# Patient Record
Sex: Female | Born: 1940
Health system: Southern US, Community
[De-identification: ages and names within clinical notes are randomized; demographics above are authoritative.]

## PROBLEM LIST (undated history)

## (undated) DIAGNOSIS — K219 Gastro-esophageal reflux disease without esophagitis: Secondary | ICD-10-CM

## (undated) DIAGNOSIS — C801 Malignant (primary) neoplasm, unspecified: Secondary | ICD-10-CM

## (undated) DIAGNOSIS — D649 Anemia, unspecified: Secondary | ICD-10-CM

## (undated) DIAGNOSIS — H9190 Unspecified hearing loss, unspecified ear: Secondary | ICD-10-CM

## (undated) DIAGNOSIS — F419 Anxiety disorder, unspecified: Secondary | ICD-10-CM

## (undated) DIAGNOSIS — Z9889 Other specified postprocedural states: Secondary | ICD-10-CM

## (undated) DIAGNOSIS — E039 Hypothyroidism, unspecified: Secondary | ICD-10-CM

## (undated) DIAGNOSIS — Z87442 Personal history of urinary calculi: Secondary | ICD-10-CM

## (undated) DIAGNOSIS — H9319 Tinnitus, unspecified ear: Secondary | ICD-10-CM

## (undated) DIAGNOSIS — M1990 Unspecified osteoarthritis, unspecified site: Secondary | ICD-10-CM

## (undated) DIAGNOSIS — R112 Nausea with vomiting, unspecified: Secondary | ICD-10-CM

## (undated) DIAGNOSIS — I10 Essential (primary) hypertension: Secondary | ICD-10-CM

## (undated) HISTORY — PX: OTHER SURGICAL HISTORY: SHX169

## (undated) HISTORY — PX: TOE SURGERY: SHX1073

## (undated) HISTORY — PX: BLEPHAROPLASTY: SUR158

## (undated) HISTORY — PX: HEMORROIDECTOMY: SUR656

## (undated) HISTORY — PX: LIPOSUCTION: SHX10

## (undated) HISTORY — PX: TONSILLECTOMY: SUR1361

## (undated) HISTORY — PX: PELVIC FLOOR REPAIR: SHX2192

## (undated) HISTORY — PX: VARICOSE VEIN SURGERY: SHX832

## (undated) HISTORY — PX: KNEE ARTHROSCOPY: SUR90

## (undated) HISTORY — PX: ABDOMINAL HYSTERECTOMY: SHX81

---

## 1997-05-09 DIAGNOSIS — C801 Malignant (primary) neoplasm, unspecified: Secondary | ICD-10-CM

## 1997-05-09 HISTORY — DX: Malignant (primary) neoplasm, unspecified: C80.1

## 2003-04-15 ENCOUNTER — Encounter: Admission: RE | Admit: 2003-04-15 | Discharge: 2003-04-15 | Payer: Self-pay | Admitting: Unknown Physician Specialty

## 2003-05-10 HISTORY — PX: CYSTOCELE REPAIR: SHX163

## 2004-02-07 ENCOUNTER — Encounter: Payer: Self-pay | Admitting: Pain Medicine

## 2004-10-07 ENCOUNTER — Ambulatory Visit: Payer: Self-pay

## 2004-11-26 ENCOUNTER — Other Ambulatory Visit: Payer: Self-pay

## 2004-12-01 ENCOUNTER — Ambulatory Visit: Payer: Self-pay | Admitting: General Practice

## 2004-12-21 ENCOUNTER — Ambulatory Visit: Payer: Self-pay | Admitting: Unknown Physician Specialty

## 2005-08-22 ENCOUNTER — Ambulatory Visit: Payer: Self-pay | Admitting: General Practice

## 2005-09-06 ENCOUNTER — Ambulatory Visit: Payer: Self-pay | Admitting: General Practice

## 2005-10-07 ENCOUNTER — Ambulatory Visit: Payer: Self-pay | Admitting: General Practice

## 2006-01-11 ENCOUNTER — Ambulatory Visit: Payer: Self-pay | Admitting: Unknown Physician Specialty

## 2006-01-30 ENCOUNTER — Encounter: Payer: Self-pay | Admitting: General Practice

## 2006-02-06 ENCOUNTER — Encounter: Payer: Self-pay | Admitting: General Practice

## 2006-02-09 ENCOUNTER — Ambulatory Visit: Payer: Self-pay | Admitting: General Practice

## 2006-03-09 ENCOUNTER — Encounter: Payer: Self-pay | Admitting: General Practice

## 2006-04-08 ENCOUNTER — Encounter: Payer: Self-pay | Admitting: General Practice

## 2006-05-09 ENCOUNTER — Encounter: Payer: Self-pay | Admitting: General Practice

## 2007-01-23 ENCOUNTER — Ambulatory Visit: Payer: Self-pay | Admitting: Unknown Physician Specialty

## 2008-01-29 ENCOUNTER — Ambulatory Visit: Payer: Self-pay | Admitting: Unknown Physician Specialty

## 2009-01-30 ENCOUNTER — Ambulatory Visit: Payer: Self-pay | Admitting: Unknown Physician Specialty

## 2010-01-29 ENCOUNTER — Encounter: Payer: Self-pay | Admitting: Unknown Physician Specialty

## 2010-02-06 ENCOUNTER — Encounter: Payer: Self-pay | Admitting: Unknown Physician Specialty

## 2010-02-10 ENCOUNTER — Ambulatory Visit: Payer: Self-pay | Admitting: Unknown Physician Specialty

## 2010-03-09 ENCOUNTER — Encounter: Payer: Self-pay | Admitting: Unknown Physician Specialty

## 2010-04-08 ENCOUNTER — Encounter: Payer: Self-pay | Admitting: Unknown Physician Specialty

## 2010-05-09 ENCOUNTER — Encounter: Payer: Self-pay | Admitting: Unknown Physician Specialty

## 2010-06-09 ENCOUNTER — Encounter: Payer: Self-pay | Admitting: Unknown Physician Specialty

## 2010-06-25 ENCOUNTER — Ambulatory Visit: Payer: Self-pay | Admitting: Podiatry

## 2010-11-03 ENCOUNTER — Ambulatory Visit: Payer: Self-pay

## 2011-03-17 ENCOUNTER — Ambulatory Visit: Payer: Self-pay | Admitting: Internal Medicine

## 2012-03-20 ENCOUNTER — Ambulatory Visit: Payer: Self-pay | Admitting: Obstetrics and Gynecology

## 2012-04-23 ENCOUNTER — Ambulatory Visit: Payer: Self-pay | Admitting: Unknown Physician Specialty

## 2012-04-24 ENCOUNTER — Ambulatory Visit: Payer: Self-pay | Admitting: Internal Medicine

## 2013-03-12 DIAGNOSIS — E785 Hyperlipidemia, unspecified: Secondary | ICD-10-CM | POA: Insufficient documentation

## 2013-04-02 ENCOUNTER — Ambulatory Visit: Payer: Self-pay | Admitting: Internal Medicine

## 2013-12-16 DIAGNOSIS — N3941 Urge incontinence: Secondary | ICD-10-CM | POA: Insufficient documentation

## 2014-05-13 ENCOUNTER — Ambulatory Visit: Payer: Self-pay | Admitting: General Practice

## 2015-05-14 DIAGNOSIS — M170 Bilateral primary osteoarthritis of knee: Secondary | ICD-10-CM | POA: Diagnosis not present

## 2015-05-19 DIAGNOSIS — I10 Essential (primary) hypertension: Secondary | ICD-10-CM | POA: Diagnosis not present

## 2015-05-19 DIAGNOSIS — E034 Atrophy of thyroid (acquired): Secondary | ICD-10-CM | POA: Diagnosis not present

## 2015-05-19 DIAGNOSIS — M170 Bilateral primary osteoarthritis of knee: Secondary | ICD-10-CM | POA: Diagnosis not present

## 2015-05-19 DIAGNOSIS — R002 Palpitations: Secondary | ICD-10-CM | POA: Diagnosis not present

## 2015-05-19 DIAGNOSIS — K219 Gastro-esophageal reflux disease without esophagitis: Secondary | ICD-10-CM | POA: Diagnosis not present

## 2015-05-19 DIAGNOSIS — E7800 Pure hypercholesterolemia, unspecified: Secondary | ICD-10-CM | POA: Diagnosis not present

## 2015-05-21 DIAGNOSIS — M170 Bilateral primary osteoarthritis of knee: Secondary | ICD-10-CM | POA: Diagnosis not present

## 2015-05-25 DIAGNOSIS — E034 Atrophy of thyroid (acquired): Secondary | ICD-10-CM | POA: Diagnosis not present

## 2015-05-25 DIAGNOSIS — R002 Palpitations: Secondary | ICD-10-CM | POA: Diagnosis not present

## 2015-05-25 DIAGNOSIS — F419 Anxiety disorder, unspecified: Secondary | ICD-10-CM | POA: Diagnosis not present

## 2015-05-25 DIAGNOSIS — E7800 Pure hypercholesterolemia, unspecified: Secondary | ICD-10-CM | POA: Diagnosis not present

## 2015-05-25 DIAGNOSIS — I10 Essential (primary) hypertension: Secondary | ICD-10-CM | POA: Diagnosis not present

## 2015-05-25 DIAGNOSIS — K219 Gastro-esophageal reflux disease without esophagitis: Secondary | ICD-10-CM | POA: Diagnosis not present

## 2015-05-25 DIAGNOSIS — M170 Bilateral primary osteoarthritis of knee: Secondary | ICD-10-CM | POA: Diagnosis not present

## 2015-06-09 DIAGNOSIS — H2513 Age-related nuclear cataract, bilateral: Secondary | ICD-10-CM | POA: Diagnosis not present

## 2015-06-16 DIAGNOSIS — F3342 Major depressive disorder, recurrent, in full remission: Secondary | ICD-10-CM | POA: Diagnosis not present

## 2015-06-16 DIAGNOSIS — F410 Panic disorder [episodic paroxysmal anxiety] without agoraphobia: Secondary | ICD-10-CM | POA: Diagnosis not present

## 2015-06-16 DIAGNOSIS — F411 Generalized anxiety disorder: Secondary | ICD-10-CM | POA: Diagnosis not present

## 2015-06-25 DIAGNOSIS — E034 Atrophy of thyroid (acquired): Secondary | ICD-10-CM | POA: Diagnosis not present

## 2015-06-25 DIAGNOSIS — I10 Essential (primary) hypertension: Secondary | ICD-10-CM | POA: Diagnosis not present

## 2015-06-26 DIAGNOSIS — N3941 Urge incontinence: Secondary | ICD-10-CM | POA: Diagnosis not present

## 2015-07-24 DIAGNOSIS — N3941 Urge incontinence: Secondary | ICD-10-CM | POA: Diagnosis not present

## 2015-07-31 DIAGNOSIS — N3281 Overactive bladder: Secondary | ICD-10-CM | POA: Diagnosis not present

## 2015-07-31 DIAGNOSIS — N8111 Cystocele, midline: Secondary | ICD-10-CM | POA: Diagnosis not present

## 2015-08-07 DIAGNOSIS — R5383 Other fatigue: Secondary | ICD-10-CM | POA: Diagnosis not present

## 2015-09-09 DIAGNOSIS — N3281 Overactive bladder: Secondary | ICD-10-CM | POA: Diagnosis not present

## 2015-09-14 DIAGNOSIS — R399 Unspecified symptoms and signs involving the genitourinary system: Secondary | ICD-10-CM | POA: Diagnosis not present

## 2015-09-23 DIAGNOSIS — R339 Retention of urine, unspecified: Secondary | ICD-10-CM | POA: Diagnosis not present

## 2015-09-23 DIAGNOSIS — N3281 Overactive bladder: Secondary | ICD-10-CM | POA: Diagnosis not present

## 2015-10-09 DIAGNOSIS — R339 Retention of urine, unspecified: Secondary | ICD-10-CM | POA: Diagnosis not present

## 2015-10-13 DIAGNOSIS — F3342 Major depressive disorder, recurrent, in full remission: Secondary | ICD-10-CM | POA: Diagnosis not present

## 2015-10-13 DIAGNOSIS — F410 Panic disorder [episodic paroxysmal anxiety] without agoraphobia: Secondary | ICD-10-CM | POA: Diagnosis not present

## 2015-10-13 DIAGNOSIS — F411 Generalized anxiety disorder: Secondary | ICD-10-CM | POA: Diagnosis not present

## 2015-10-14 DIAGNOSIS — R1032 Left lower quadrant pain: Secondary | ICD-10-CM | POA: Diagnosis not present

## 2015-10-17 DIAGNOSIS — R8290 Unspecified abnormal findings in urine: Secondary | ICD-10-CM | POA: Diagnosis not present

## 2015-10-17 DIAGNOSIS — N390 Urinary tract infection, site not specified: Secondary | ICD-10-CM | POA: Diagnosis not present

## 2015-10-20 DIAGNOSIS — N3281 Overactive bladder: Secondary | ICD-10-CM | POA: Diagnosis not present

## 2015-10-20 DIAGNOSIS — R339 Retention of urine, unspecified: Secondary | ICD-10-CM | POA: Diagnosis not present

## 2015-12-02 DIAGNOSIS — R8290 Unspecified abnormal findings in urine: Secondary | ICD-10-CM | POA: Diagnosis not present

## 2015-12-02 DIAGNOSIS — R339 Retention of urine, unspecified: Secondary | ICD-10-CM | POA: Diagnosis not present

## 2015-12-02 DIAGNOSIS — K582 Mixed irritable bowel syndrome: Secondary | ICD-10-CM | POA: Diagnosis not present

## 2015-12-02 DIAGNOSIS — N3281 Overactive bladder: Secondary | ICD-10-CM | POA: Diagnosis not present

## 2015-12-03 DIAGNOSIS — M170 Bilateral primary osteoarthritis of knee: Secondary | ICD-10-CM | POA: Diagnosis not present

## 2015-12-07 DIAGNOSIS — R350 Frequency of micturition: Secondary | ICD-10-CM | POA: Diagnosis not present

## 2015-12-10 DIAGNOSIS — M170 Bilateral primary osteoarthritis of knee: Secondary | ICD-10-CM | POA: Diagnosis not present

## 2015-12-15 DIAGNOSIS — I10 Essential (primary) hypertension: Secondary | ICD-10-CM | POA: Diagnosis not present

## 2015-12-15 DIAGNOSIS — E034 Atrophy of thyroid (acquired): Secondary | ICD-10-CM | POA: Diagnosis not present

## 2015-12-15 DIAGNOSIS — M170 Bilateral primary osteoarthritis of knee: Secondary | ICD-10-CM | POA: Diagnosis not present

## 2015-12-17 DIAGNOSIS — M170 Bilateral primary osteoarthritis of knee: Secondary | ICD-10-CM | POA: Diagnosis not present

## 2015-12-29 DIAGNOSIS — N3946 Mixed incontinence: Secondary | ICD-10-CM | POA: Diagnosis not present

## 2015-12-29 DIAGNOSIS — M6281 Muscle weakness (generalized): Secondary | ICD-10-CM | POA: Diagnosis not present

## 2015-12-30 ENCOUNTER — Other Ambulatory Visit: Payer: Self-pay | Admitting: Internal Medicine

## 2015-12-30 DIAGNOSIS — E7800 Pure hypercholesterolemia, unspecified: Secondary | ICD-10-CM | POA: Diagnosis not present

## 2015-12-30 DIAGNOSIS — F3341 Major depressive disorder, recurrent, in partial remission: Secondary | ICD-10-CM | POA: Diagnosis not present

## 2015-12-30 DIAGNOSIS — F419 Anxiety disorder, unspecified: Secondary | ICD-10-CM | POA: Diagnosis not present

## 2015-12-30 DIAGNOSIS — Z1231 Encounter for screening mammogram for malignant neoplasm of breast: Secondary | ICD-10-CM

## 2015-12-30 DIAGNOSIS — M170 Bilateral primary osteoarthritis of knee: Secondary | ICD-10-CM | POA: Diagnosis not present

## 2015-12-30 DIAGNOSIS — Z1239 Encounter for other screening for malignant neoplasm of breast: Secondary | ICD-10-CM | POA: Diagnosis not present

## 2015-12-30 DIAGNOSIS — R002 Palpitations: Secondary | ICD-10-CM | POA: Diagnosis not present

## 2015-12-30 DIAGNOSIS — I10 Essential (primary) hypertension: Secondary | ICD-10-CM | POA: Diagnosis not present

## 2015-12-30 DIAGNOSIS — E034 Atrophy of thyroid (acquired): Secondary | ICD-10-CM | POA: Diagnosis not present

## 2015-12-30 DIAGNOSIS — K219 Gastro-esophageal reflux disease without esophagitis: Secondary | ICD-10-CM | POA: Diagnosis not present

## 2015-12-30 DIAGNOSIS — N3941 Urge incontinence: Secondary | ICD-10-CM | POA: Diagnosis not present

## 2016-01-04 DIAGNOSIS — R350 Frequency of micturition: Secondary | ICD-10-CM | POA: Diagnosis not present

## 2016-01-08 DIAGNOSIS — R339 Retention of urine, unspecified: Secondary | ICD-10-CM | POA: Diagnosis not present

## 2016-01-12 ENCOUNTER — Ambulatory Visit: Payer: Self-pay

## 2016-01-14 DIAGNOSIS — M6281 Muscle weakness (generalized): Secondary | ICD-10-CM | POA: Diagnosis not present

## 2016-01-26 DIAGNOSIS — F3342 Major depressive disorder, recurrent, in full remission: Secondary | ICD-10-CM | POA: Diagnosis not present

## 2016-01-26 DIAGNOSIS — F411 Generalized anxiety disorder: Secondary | ICD-10-CM | POA: Diagnosis not present

## 2016-01-26 DIAGNOSIS — F410 Panic disorder [episodic paroxysmal anxiety] without agoraphobia: Secondary | ICD-10-CM | POA: Diagnosis not present

## 2016-01-29 DIAGNOSIS — Z85828 Personal history of other malignant neoplasm of skin: Secondary | ICD-10-CM | POA: Diagnosis not present

## 2016-01-29 DIAGNOSIS — D2272 Melanocytic nevi of left lower limb, including hip: Secondary | ICD-10-CM | POA: Diagnosis not present

## 2016-01-29 DIAGNOSIS — D225 Melanocytic nevi of trunk: Secondary | ICD-10-CM | POA: Diagnosis not present

## 2016-01-29 DIAGNOSIS — I781 Nevus, non-neoplastic: Secondary | ICD-10-CM | POA: Diagnosis not present

## 2016-01-29 DIAGNOSIS — M6281 Muscle weakness (generalized): Secondary | ICD-10-CM | POA: Diagnosis not present

## 2016-02-08 ENCOUNTER — Other Ambulatory Visit: Payer: Self-pay | Admitting: Internal Medicine

## 2016-02-08 ENCOUNTER — Ambulatory Visit
Admission: RE | Admit: 2016-02-08 | Discharge: 2016-02-08 | Disposition: A | Discharge status: Home / Self Care | Payer: PPO | Source: Ambulatory Visit | Attending: Internal Medicine | Admitting: Internal Medicine

## 2016-02-08 DIAGNOSIS — Z1231 Encounter for screening mammogram for malignant neoplasm of breast: Secondary | ICD-10-CM | POA: Diagnosis not present

## 2016-02-12 DIAGNOSIS — M6281 Muscle weakness (generalized): Secondary | ICD-10-CM | POA: Diagnosis not present

## 2016-02-15 DIAGNOSIS — R339 Retention of urine, unspecified: Secondary | ICD-10-CM | POA: Diagnosis not present

## 2016-02-18 DIAGNOSIS — M6281 Muscle weakness (generalized): Secondary | ICD-10-CM | POA: Diagnosis not present

## 2016-03-01 DIAGNOSIS — M6281 Muscle weakness (generalized): Secondary | ICD-10-CM | POA: Diagnosis not present

## 2016-03-15 DIAGNOSIS — M6281 Muscle weakness (generalized): Secondary | ICD-10-CM | POA: Diagnosis not present

## 2016-03-21 DIAGNOSIS — M6281 Muscle weakness (generalized): Secondary | ICD-10-CM | POA: Diagnosis not present

## 2016-03-22 DIAGNOSIS — R339 Retention of urine, unspecified: Secondary | ICD-10-CM | POA: Diagnosis not present

## 2016-03-29 DIAGNOSIS — M6281 Muscle weakness (generalized): Secondary | ICD-10-CM | POA: Diagnosis not present

## 2016-04-05 DIAGNOSIS — M6281 Muscle weakness (generalized): Secondary | ICD-10-CM | POA: Diagnosis not present

## 2016-04-19 DIAGNOSIS — M6281 Muscle weakness (generalized): Secondary | ICD-10-CM | POA: Diagnosis not present

## 2016-05-24 DIAGNOSIS — F3342 Major depressive disorder, recurrent, in full remission: Secondary | ICD-10-CM | POA: Diagnosis not present

## 2016-05-24 DIAGNOSIS — M6281 Muscle weakness (generalized): Secondary | ICD-10-CM | POA: Diagnosis not present

## 2016-05-24 DIAGNOSIS — F411 Generalized anxiety disorder: Secondary | ICD-10-CM | POA: Diagnosis not present

## 2016-05-24 DIAGNOSIS — F410 Panic disorder [episodic paroxysmal anxiety] without agoraphobia: Secondary | ICD-10-CM | POA: Diagnosis not present

## 2016-06-14 DIAGNOSIS — H2513 Age-related nuclear cataract, bilateral: Secondary | ICD-10-CM | POA: Diagnosis not present

## 2016-06-27 DIAGNOSIS — I10 Essential (primary) hypertension: Secondary | ICD-10-CM | POA: Diagnosis not present

## 2016-06-27 DIAGNOSIS — K219 Gastro-esophageal reflux disease without esophagitis: Secondary | ICD-10-CM | POA: Diagnosis not present

## 2016-06-27 DIAGNOSIS — E034 Atrophy of thyroid (acquired): Secondary | ICD-10-CM | POA: Diagnosis not present

## 2016-06-27 DIAGNOSIS — M170 Bilateral primary osteoarthritis of knee: Secondary | ICD-10-CM | POA: Diagnosis not present

## 2016-06-30 DIAGNOSIS — M1711 Unilateral primary osteoarthritis, right knee: Secondary | ICD-10-CM | POA: Diagnosis not present

## 2016-06-30 DIAGNOSIS — M1712 Unilateral primary osteoarthritis, left knee: Secondary | ICD-10-CM | POA: Diagnosis not present

## 2016-07-01 DIAGNOSIS — I10 Essential (primary) hypertension: Secondary | ICD-10-CM | POA: Diagnosis not present

## 2016-07-01 DIAGNOSIS — E034 Atrophy of thyroid (acquired): Secondary | ICD-10-CM | POA: Diagnosis not present

## 2016-07-01 DIAGNOSIS — R002 Palpitations: Secondary | ICD-10-CM | POA: Diagnosis not present

## 2016-07-01 DIAGNOSIS — M170 Bilateral primary osteoarthritis of knee: Secondary | ICD-10-CM | POA: Diagnosis not present

## 2016-07-01 DIAGNOSIS — K219 Gastro-esophageal reflux disease without esophagitis: Secondary | ICD-10-CM | POA: Diagnosis not present

## 2016-07-01 DIAGNOSIS — E7800 Pure hypercholesterolemia, unspecified: Secondary | ICD-10-CM | POA: Diagnosis not present

## 2016-07-01 DIAGNOSIS — F3341 Major depressive disorder, recurrent, in partial remission: Secondary | ICD-10-CM | POA: Diagnosis not present

## 2016-07-01 DIAGNOSIS — F419 Anxiety disorder, unspecified: Secondary | ICD-10-CM | POA: Diagnosis not present

## 2016-07-13 DIAGNOSIS — J069 Acute upper respiratory infection, unspecified: Secondary | ICD-10-CM | POA: Diagnosis not present

## 2016-07-13 DIAGNOSIS — F3341 Major depressive disorder, recurrent, in partial remission: Secondary | ICD-10-CM | POA: Diagnosis not present

## 2016-07-28 DIAGNOSIS — M6281 Muscle weakness (generalized): Secondary | ICD-10-CM | POA: Diagnosis not present

## 2016-07-28 DIAGNOSIS — N3946 Mixed incontinence: Secondary | ICD-10-CM | POA: Diagnosis not present

## 2016-08-16 DIAGNOSIS — M170 Bilateral primary osteoarthritis of knee: Secondary | ICD-10-CM | POA: Diagnosis not present

## 2016-08-17 DIAGNOSIS — L728 Other follicular cysts of the skin and subcutaneous tissue: Secondary | ICD-10-CM | POA: Diagnosis not present

## 2016-08-23 DIAGNOSIS — M170 Bilateral primary osteoarthritis of knee: Secondary | ICD-10-CM | POA: Diagnosis not present

## 2016-08-30 DIAGNOSIS — M170 Bilateral primary osteoarthritis of knee: Secondary | ICD-10-CM | POA: Diagnosis not present

## 2016-09-20 DIAGNOSIS — F410 Panic disorder [episodic paroxysmal anxiety] without agoraphobia: Secondary | ICD-10-CM | POA: Diagnosis not present

## 2016-09-20 DIAGNOSIS — F411 Generalized anxiety disorder: Secondary | ICD-10-CM | POA: Diagnosis not present

## 2016-09-20 DIAGNOSIS — F3342 Major depressive disorder, recurrent, in full remission: Secondary | ICD-10-CM | POA: Diagnosis not present

## 2016-10-07 DIAGNOSIS — Z23 Encounter for immunization: Secondary | ICD-10-CM | POA: Diagnosis not present

## 2016-11-01 DIAGNOSIS — M25562 Pain in left knee: Secondary | ICD-10-CM | POA: Diagnosis not present

## 2016-11-01 DIAGNOSIS — M1712 Unilateral primary osteoarthritis, left knee: Secondary | ICD-10-CM | POA: Diagnosis not present

## 2016-11-02 ENCOUNTER — Encounter
Admission: RE | Admit: 2016-11-02 | Discharge: 2016-11-02 | Disposition: A | Discharge status: Home / Self Care | Payer: PPO | Source: Ambulatory Visit | Attending: Orthopedic Surgery | Admitting: Orthopedic Surgery

## 2016-11-02 DIAGNOSIS — F3342 Major depressive disorder, recurrent, in full remission: Secondary | ICD-10-CM | POA: Diagnosis not present

## 2016-11-02 DIAGNOSIS — Z01818 Encounter for other preprocedural examination: Secondary | ICD-10-CM | POA: Diagnosis not present

## 2016-11-02 DIAGNOSIS — F411 Generalized anxiety disorder: Secondary | ICD-10-CM | POA: Diagnosis not present

## 2016-11-02 DIAGNOSIS — M1712 Unilateral primary osteoarthritis, left knee: Secondary | ICD-10-CM | POA: Diagnosis not present

## 2016-11-02 DIAGNOSIS — F410 Panic disorder [episodic paroxysmal anxiety] without agoraphobia: Secondary | ICD-10-CM | POA: Diagnosis not present

## 2016-11-02 DIAGNOSIS — Z01812 Encounter for preprocedural laboratory examination: Secondary | ICD-10-CM | POA: Diagnosis not present

## 2016-11-02 DIAGNOSIS — Z0183 Encounter for blood typing: Secondary | ICD-10-CM | POA: Insufficient documentation

## 2016-11-02 DIAGNOSIS — I10 Essential (primary) hypertension: Secondary | ICD-10-CM | POA: Diagnosis not present

## 2016-11-02 HISTORY — DX: Tinnitus, unspecified ear: H93.19

## 2016-11-02 HISTORY — DX: Anxiety disorder, unspecified: F41.9

## 2016-11-02 HISTORY — DX: Unspecified hearing loss, unspecified ear: H91.90

## 2016-11-02 HISTORY — DX: Essential (primary) hypertension: I10

## 2016-11-02 HISTORY — DX: Hypothyroidism, unspecified: E03.9

## 2016-11-02 LAB — COMPREHENSIVE METABOLIC PANEL
ALBUMIN: 4.4 g/dL (ref 3.5–5.0)
ALK PHOS: 58 U/L (ref 38–126)
ALT: 12 U/L — ABNORMAL LOW (ref 14–54)
ANION GAP: 5 (ref 5–15)
AST: 21 U/L (ref 15–41)
BUN: 19 mg/dL (ref 6–20)
CALCIUM: 9 mg/dL (ref 8.9–10.3)
CHLORIDE: 102 mmol/L (ref 101–111)
CO2: 28 mmol/L (ref 22–32)
Creatinine, Ser: 0.77 mg/dL (ref 0.44–1.00)
GFR calc Af Amer: >60 mL/min (ref >60)
GFR calc non Af Amer: >60 mL/min (ref >60)
GLUCOSE: 89 mg/dL (ref 65–99)
Potassium: 3.9 mmol/L (ref 3.5–5.1)
SODIUM: 135 mmol/L (ref 135–145)
Total Bilirubin: 0.8 mg/dL (ref 0.3–1.2)
Total Protein: 7.7 g/dL (ref 6.5–8.1)

## 2016-11-02 LAB — URINALYSIS, ROUTINE W REFLEX MICROSCOPIC
BILIRUBIN URINE: NEGATIVE
Glucose, UA: NEGATIVE mg/dL
HGB URINE DIPSTICK: NEGATIVE
KETONES UR: NEGATIVE mg/dL
Leukocytes, UA: NEGATIVE
Nitrite: NEGATIVE
PROTEIN: NEGATIVE mg/dL
Specific Gravity, Urine: 1.006 (ref 1.005–1.030)
pH: 6 (ref 5.0–8.0)

## 2016-11-02 LAB — SEDIMENTATION RATE: Sed Rate: 8 mm/h (ref 0–30)

## 2016-11-02 LAB — CBC
HCT: 39.4 % (ref 35.0–47.0)
Hemoglobin: 13.3 g/dL (ref 12.0–16.0)
MCH: 30.8 pg (ref 26.0–34.0)
MCHC: 33.8 g/dL (ref 32.0–36.0)
MCV: 91 fL (ref 80.0–100.0)
Platelets: 281 K/uL (ref 150–440)
RBC: 4.32 MIL/uL (ref 3.80–5.20)
RDW: 13.9 % (ref 11.5–14.5)
WBC: 5.3 K/uL (ref 3.6–11.0)

## 2016-11-02 LAB — TYPE AND SCREEN
ABO/RH(D): O POS
Antibody Screen: NEGATIVE

## 2016-11-02 LAB — SURGICAL PCR SCREEN
MRSA, PCR: NEGATIVE
Staphylococcus aureus: NEGATIVE

## 2016-11-02 LAB — C-REACTIVE PROTEIN

## 2016-11-02 LAB — PROTIME-INR
INR: 1.01
Prothrombin Time: 13.3 s (ref 11.4–15.2)

## 2016-11-02 LAB — APTT: aPTT: 30 s (ref 24–36)

## 2016-11-02 NOTE — Patient Instructions (Signed)
Your procedure is scheduled on: 11/14/16 Mon Report to Same Day Surgery 2nd floor medical mall [facility] Entrance-take elevator on left to 2nd floor.  Check in with surgery information desk.) To find out your arrival time please call [phone] between 1PM - 3PM on Fri 11/11/16  Remember: Instructions that are not followed completely may result in serious medical risk, up to and including death, or upon the discretion of your surgeon and anesthesiologist your surgery may need to be rescheduled.    _x___ 1. Do not eat food or drink liquids after midnight. No gum chewing or                              hard candies.     __x__ 2. No Alcohol for 24 hours before or after surgery.   __x__3. No Smoking for 24 prior to surgery.   ____  4. Bring all medications with you on the day of surgery if instructed.    __x__ 5. Notify your doctor if there is any change in your medical condition     (cold, fever, infections).     Do not wear jewelry, make-up, hairpins, clips or nail polish.  Do not wear lotions, powders, or perfumes. You may wear deodorant.  Do not shave 48 hours prior to surgery. Men may shave face and neck.  Do not bring valuables to the hospital.    [facility] is not responsible for any belongings or valuables.               Contacts, dentures or bridgework may not be worn into surgery.  Leave your suitcase in the car. After surgery it may be brought to your room.  For patients admitted to the hospital, discharge time is determined by your                       treatment team.   Patients discharged the day of surgery will not be allowed to drive home.  You will need someone to drive you home and stay with you the night of your procedure.    Please read over the following fact sheets that you were given:   [facility] Preparing for Surgery and or MRSA Information   _x___ Take anti-hypertensive (unless it includes a diuretic), cardiac, seizure, asthma,     anti-reflux and  psychiatric medicines. These include:  1. levothyroxine (SYNTHROID,   2.metoprolol succinate (TOPROL  3.sertraline (ZOLOFT  4.  5.  6.  ____Fleets enema or Magnesium Citrate as directed.   _x___ Use CHG Soap or sage wipes as directed on instruction sheet   ____ Use inhalers on the day of surgery and bring to hospital day of surgery  ____ Stop Metformin and Janumet 2 days prior to surgery.    ____ Take 1/2 of usual insulin dose the night before surgery and none on the morning     surgery.   _x___ Follow recommendations from Cardiologist, Pulmonologist or PCP regarding          stopping Aspirin, Coumadin, Pllavix ,Eliquis, Effient, or Pradaxa, and Pletal.  X____Stop Anti-inflammatories such as Advil, Aleve, Ibuprofen, Motrin, Naproxen, Naprosyn, Goodies powders or aspirin products. OK to take Tylenol and                          Celebrex.   _x___ Stop supplements until after surgery.  But may  continue Vitamin D, Vitamin B,       and multivitamin.   ____ Bring C-Pap to the hospital.

## 2016-11-06 HISTORY — PX: JOINT REPLACEMENT: SHX530

## 2016-11-07 NOTE — Pre-Procedure Instructions (Signed)
[facility] CALLED AND FAXED TO SURGEON OFFICE

## 2016-11-10 LAB — URINE CULTURE
Culture: 50000 — AB
Special Requests: NORMAL

## 2016-11-13 MED ORDER — CEFAZOLIN SODIUM-DEXTROSE 2-4 GM/100ML-% IV SOLN
2.0000 g | INTRAVENOUS | Status: AC
  Administered 2016-11-14: 2 g via INTRAVENOUS

## 2016-11-13 MED ORDER — TRANEXAMIC ACID 1000 MG/10ML IV SOLN
1000.0000 mg | INTRAVENOUS | Status: AC
  Administered 2016-11-14: 1000 mg via INTRAVENOUS
  Filled 2016-11-13: qty 10

## 2016-11-14 ENCOUNTER — Encounter
Admission: RE | Disposition: A | Discharge status: Home Health | Payer: Self-pay | Source: Ambulatory Visit | Attending: Orthopedic Surgery

## 2016-11-14 ENCOUNTER — Inpatient Hospital Stay: Payer: PPO

## 2016-11-14 ENCOUNTER — Inpatient Hospital Stay
Admission: RE | Admit: 2016-11-14 | Discharge: 2016-11-16 | DRG: 470 | Disposition: A | Discharge status: Home Health | Payer: PPO | Source: Ambulatory Visit | Attending: Orthopedic Surgery | Admitting: Orthopedic Surgery

## 2016-11-14 ENCOUNTER — Inpatient Hospital Stay: Payer: PPO | Admitting: Anesthesiology

## 2016-11-14 ENCOUNTER — Encounter: Payer: Self-pay | Admitting: Orthopedic Surgery

## 2016-11-14 DIAGNOSIS — M1712 Unilateral primary osteoarthritis, left knee: Principal | ICD-10-CM | POA: Diagnosis present

## 2016-11-14 DIAGNOSIS — Z8261 Family history of arthritis: Secondary | ICD-10-CM

## 2016-11-14 DIAGNOSIS — E785 Hyperlipidemia, unspecified: Secondary | ICD-10-CM | POA: Diagnosis present

## 2016-11-14 DIAGNOSIS — Z808 Family history of malignant neoplasm of other organs or systems: Secondary | ICD-10-CM

## 2016-11-14 DIAGNOSIS — I10 Essential (primary) hypertension: Secondary | ICD-10-CM | POA: Diagnosis present

## 2016-11-14 DIAGNOSIS — Z8249 Family history of ischemic heart disease and other diseases of the circulatory system: Secondary | ICD-10-CM

## 2016-11-14 DIAGNOSIS — Z8049 Family history of malignant neoplasm of other genital organs: Secondary | ICD-10-CM

## 2016-11-14 DIAGNOSIS — Z833 Family history of diabetes mellitus: Secondary | ICD-10-CM

## 2016-11-14 DIAGNOSIS — Z87891 Personal history of nicotine dependence: Secondary | ICD-10-CM | POA: Diagnosis not present

## 2016-11-14 DIAGNOSIS — Z96652 Presence of left artificial knee joint: Secondary | ICD-10-CM | POA: Diagnosis not present

## 2016-11-14 DIAGNOSIS — Z882 Allergy status to sulfonamides status: Secondary | ICD-10-CM

## 2016-11-14 DIAGNOSIS — F419 Anxiety disorder, unspecified: Secondary | ICD-10-CM | POA: Diagnosis present

## 2016-11-14 DIAGNOSIS — H9190 Unspecified hearing loss, unspecified ear: Secondary | ICD-10-CM | POA: Diagnosis present

## 2016-11-14 DIAGNOSIS — Z881 Allergy status to other antibiotic agents status: Secondary | ICD-10-CM

## 2016-11-14 DIAGNOSIS — Z87442 Personal history of urinary calculi: Secondary | ICD-10-CM

## 2016-11-14 DIAGNOSIS — Z90710 Acquired absence of both cervix and uterus: Secondary | ICD-10-CM | POA: Diagnosis not present

## 2016-11-14 DIAGNOSIS — M25562 Pain in left knee: Secondary | ICD-10-CM | POA: Diagnosis present

## 2016-11-14 DIAGNOSIS — Z471 Aftercare following joint replacement surgery: Secondary | ICD-10-CM | POA: Diagnosis not present

## 2016-11-14 DIAGNOSIS — Z886 Allergy status to analgesic agent status: Secondary | ICD-10-CM

## 2016-11-14 DIAGNOSIS — Z888 Allergy status to other drugs, medicaments and biological substances status: Secondary | ICD-10-CM

## 2016-11-14 DIAGNOSIS — E039 Hypothyroidism, unspecified: Secondary | ICD-10-CM | POA: Diagnosis present

## 2016-11-14 DIAGNOSIS — N200 Calculus of kidney: Secondary | ICD-10-CM | POA: Diagnosis not present

## 2016-11-14 DIAGNOSIS — Z96659 Presence of unspecified artificial knee joint: Secondary | ICD-10-CM

## 2016-11-14 HISTORY — PX: KNEE ARTHROPLASTY: SHX992

## 2016-11-14 LAB — ABO/RH: ABO/RH(D): O POS

## 2016-11-14 SURGERY — ARTHROPLASTY, KNEE, TOTAL, USING IMAGELESS COMPUTER-ASSISTED NAVIGATION
Anesthesia: Spinal | Site: Knee | Laterality: Left | Wound class: Clean

## 2016-11-14 MED ORDER — BUPIVACAINE HCL (PF) 0.25 % IJ SOLN
INTRAMUSCULAR | Status: AC
  Filled 2016-11-14: qty 60

## 2016-11-14 MED ORDER — PROPOFOL 500 MG/50ML IV EMUL
INTRAVENOUS | Status: DC | PRN
  Administered 2016-11-14: 50 ug/kg/min via INTRAVENOUS

## 2016-11-14 MED ORDER — MAGNESIUM HYDROXIDE 400 MG/5ML PO SUSP
30.0000 mL | Freq: Every day | ORAL | Status: DC | PRN
  Administered 2016-11-15: 30 mL via ORAL
  Filled 2016-11-14: qty 30

## 2016-11-14 MED ORDER — FENTANYL CITRATE (PF) 100 MCG/2ML IJ SOLN
INTRAMUSCULAR | Status: AC
  Filled 2016-11-14: qty 2

## 2016-11-14 MED ORDER — FLEET ENEMA 7-19 GM/118ML RE ENEM: 1.0000 | ENEMA | Freq: Once | RECTAL | Status: DC | PRN

## 2016-11-14 MED ORDER — METOCLOPRAMIDE HCL 10 MG PO TABS
10.0000 mg | ORAL_TABLET | Freq: Three times a day (TID) | ORAL | Status: DC
  Administered 2016-11-14 – 2016-11-16 (×6): 10 mg via ORAL
  Filled 2016-11-14 (×6): qty 1

## 2016-11-14 MED ORDER — DEXAMETHASONE SODIUM PHOSPHATE 10 MG/ML IJ SOLN
INTRAMUSCULAR | Status: AC
  Filled 2016-11-14: qty 1

## 2016-11-14 MED ORDER — TRAMADOL HCL 50 MG PO TABS
50.0000 mg | ORAL_TABLET | ORAL | Status: DC | PRN
  Administered 2016-11-14 (×2): 50 mg via ORAL
  Administered 2016-11-15 (×2): 100 mg via ORAL
  Administered 2016-11-15 (×2): 50 mg via ORAL
  Administered 2016-11-16 (×4): 100 mg via ORAL
  Filled 2016-11-14 (×2): qty 2
  Filled 2016-11-14: qty 1
  Filled 2016-11-14 (×2): qty 2
  Filled 2016-11-14: qty 1
  Filled 2016-11-14 (×2): qty 2
  Filled 2016-11-14 (×2): qty 1

## 2016-11-14 MED ORDER — FAMOTIDINE 20 MG PO TABS
ORAL_TABLET | ORAL | Status: AC
  Administered 2016-11-14: 20 mg via ORAL
  Filled 2016-11-14: qty 1

## 2016-11-14 MED ORDER — BUPIVACAINE HCL (PF) 0.25 % IJ SOLN
INTRAMUSCULAR | Status: DC | PRN
  Administered 2016-11-14: 60 mL

## 2016-11-14 MED ORDER — ENOXAPARIN SODIUM 30 MG/0.3ML ~~LOC~~ SOLN
30.0000 mg | Freq: Two times a day (BID) | SUBCUTANEOUS | Status: DC
  Administered 2016-11-15 – 2016-11-16 (×3): 30 mg via SUBCUTANEOUS
  Filled 2016-11-14 (×3): qty 0.3

## 2016-11-14 MED ORDER — LACTATED RINGERS IV SOLN
INTRAVENOUS | Status: DC
  Administered 2016-11-14 (×2): via INTRAVENOUS

## 2016-11-14 MED ORDER — BISACODYL 10 MG RE SUPP
10.0000 mg | Freq: Every day | RECTAL | Status: DC | PRN
  Administered 2016-11-16: 10 mg via RECTAL
  Filled 2016-11-14: qty 1

## 2016-11-14 MED ORDER — BUPIVACAINE LIPOSOME 1.3 % IJ SUSP
INTRAMUSCULAR | Status: AC
  Filled 2016-11-14: qty 20

## 2016-11-14 MED ORDER — VITAMIN B-12 1000 MCG PO TABS
1000.0000 ug | ORAL_TABLET | Freq: Every day | ORAL | Status: DC
  Administered 2016-11-15 – 2016-11-16 (×2): 1000 ug via ORAL
  Filled 2016-11-14 (×2): qty 1

## 2016-11-14 MED ORDER — ESTRADIOL 0.5 MG PO TABS: 0.1250 mg | ORAL_TABLET | Freq: Every day | ORAL | Status: DC

## 2016-11-14 MED ORDER — ONDANSETRON HCL 4 MG/2ML IJ SOLN: 4.0000 mg | Freq: Four times a day (QID) | INTRAMUSCULAR | Status: DC | PRN

## 2016-11-14 MED ORDER — MORPHINE SULFATE (PF) 2 MG/ML IV SOLN: 2.0000 mg | INTRAVENOUS | Status: DC | PRN

## 2016-11-14 MED ORDER — LEVOTHYROXINE SODIUM 100 MCG PO TABS
100.0000 ug | ORAL_TABLET | Freq: Every day | ORAL | Status: DC
  Administered 2016-11-15 – 2016-11-16 (×2): 100 ug via ORAL
  Filled 2016-11-14 (×2): qty 1

## 2016-11-14 MED ORDER — SODIUM CHLORIDE 0.9 % IJ SOLN
INTRAMUSCULAR | Status: AC
  Filled 2016-11-14: qty 50

## 2016-11-14 MED ORDER — NEOMYCIN-POLYMYXIN B GU 40-200000 IR SOLN
Status: AC
  Filled 2016-11-14: qty 1

## 2016-11-14 MED ORDER — MENTHOL 3 MG MT LOZG
1.0000 | LOZENGE | OROMUCOSAL | Status: DC | PRN
  Filled 2016-11-14: qty 9

## 2016-11-14 MED ORDER — OXYCODONE HCL 5 MG PO TABS
5.0000 mg | ORAL_TABLET | ORAL | Status: DC | PRN
  Filled 2016-11-14: qty 1

## 2016-11-14 MED ORDER — SERTRALINE HCL 50 MG PO TABS
75.0000 mg | ORAL_TABLET | Freq: Every day | ORAL | Status: DC
  Administered 2016-11-15 – 2016-11-16 (×2): 75 mg via ORAL
  Filled 2016-11-14 (×2): qty 2

## 2016-11-14 MED ORDER — SENNOSIDES-DOCUSATE SODIUM 8.6-50 MG PO TABS
1.0000 | ORAL_TABLET | Freq: Two times a day (BID) | ORAL | Status: DC
  Administered 2016-11-14 – 2016-11-16 (×4): 1 via ORAL
  Filled 2016-11-14 (×4): qty 1

## 2016-11-14 MED ORDER — PROPOFOL 500 MG/50ML IV EMUL
INTRAVENOUS | Status: AC
  Filled 2016-11-14: qty 50

## 2016-11-14 MED ORDER — ACETAMINOPHEN 650 MG RE SUPP: 650.0000 mg | Freq: Four times a day (QID) | RECTAL | Status: DC | PRN

## 2016-11-14 MED ORDER — FENTANYL CITRATE (PF) 100 MCG/2ML IJ SOLN
INTRAMUSCULAR | Status: DC | PRN
  Administered 2016-11-14: 50 ug via INTRAVENOUS

## 2016-11-14 MED ORDER — DEXAMETHASONE SODIUM PHOSPHATE 4 MG/ML IJ SOLN
INTRAMUSCULAR | Status: DC | PRN
  Administered 2016-11-14: 5 mg via INTRAVENOUS

## 2016-11-14 MED ORDER — PHENYLEPHRINE HCL 10 MG/ML IJ SOLN
INTRAMUSCULAR | Status: DC | PRN
  Administered 2016-11-14: 30 ug/min via INTRAVENOUS

## 2016-11-14 MED ORDER — ACETAMINOPHEN 10 MG/ML IV SOLN
INTRAVENOUS | Status: DC | PRN
  Administered 2016-11-14: 1000 mg via INTRAVENOUS

## 2016-11-14 MED ORDER — ACETAMINOPHEN 10 MG/ML IV SOLN
1000.0000 mg | Freq: Four times a day (QID) | INTRAVENOUS | Status: AC
  Administered 2016-11-14 – 2016-11-15 (×3): 1000 mg via INTRAVENOUS
  Filled 2016-11-14 (×4): qty 100

## 2016-11-14 MED ORDER — FERROUS SULFATE 325 (65 FE) MG PO TABS
325.0000 mg | ORAL_TABLET | Freq: Two times a day (BID) | ORAL | Status: DC
  Administered 2016-11-14 – 2016-11-16 (×4): 325 mg via ORAL
  Filled 2016-11-14 (×4): qty 1

## 2016-11-14 MED ORDER — FENTANYL CITRATE (PF) 100 MCG/2ML IJ SOLN: 25.0000 ug | INTRAMUSCULAR | Status: DC | PRN

## 2016-11-14 MED ORDER — PHENYLEPHRINE HCL 10 MG/ML IJ SOLN
INTRAMUSCULAR | Status: AC
  Filled 2016-11-14: qty 1

## 2016-11-14 MED ORDER — ONDANSETRON HCL 4 MG/2ML IJ SOLN: 4.0000 mg | Freq: Once | INTRAMUSCULAR | Status: DC | PRN

## 2016-11-14 MED ORDER — RISAQUAD PO CAPS
1.0000 | ORAL_CAPSULE | Freq: Every day | ORAL | Status: DC
  Administered 2016-11-14 – 2016-11-16 (×3): 1 via ORAL
  Filled 2016-11-14 (×3): qty 1

## 2016-11-14 MED ORDER — METOPROLOL SUCCINATE ER 50 MG PO TB24: 100.0000 mg | ORAL_TABLET | Freq: Every day | ORAL | Status: DC

## 2016-11-14 MED ORDER — PANTOPRAZOLE SODIUM 40 MG PO TBEC
40.0000 mg | DELAYED_RELEASE_TABLET | Freq: Two times a day (BID) | ORAL | Status: DC
  Administered 2016-11-14 – 2016-11-16 (×4): 40 mg via ORAL
  Filled 2016-11-14 (×4): qty 1

## 2016-11-14 MED ORDER — LIDOCAINE HCL (PF) 2 % IJ SOLN
INTRAMUSCULAR | Status: AC
  Filled 2016-11-14: qty 2

## 2016-11-14 MED ORDER — CEFAZOLIN SODIUM-DEXTROSE 2-4 GM/100ML-% IV SOLN
2.0000 g | Freq: Four times a day (QID) | INTRAVENOUS | Status: AC
  Administered 2016-11-14 – 2016-11-15 (×4): 2 g via INTRAVENOUS
  Filled 2016-11-14 (×5): qty 100

## 2016-11-14 MED ORDER — PROPOFOL 10 MG/ML IV BOLUS
INTRAVENOUS | Status: DC | PRN
  Administered 2016-11-14: 21 mg via INTRAVENOUS
  Administered 2016-11-14: 14 mg via INTRAVENOUS

## 2016-11-14 MED ORDER — GLYCOPYRROLATE 0.2 MG/ML IJ SOLN
INTRAMUSCULAR | Status: DC | PRN
  Administered 2016-11-14: 0.2 mg via INTRAVENOUS

## 2016-11-14 MED ORDER — NEOMYCIN-POLYMYXIN B GU 40-200000 IR SOLN
Status: DC | PRN
  Administered 2016-11-14: 14 mL

## 2016-11-14 MED ORDER — SODIUM CHLORIDE 0.9 % IV SOLN
INTRAVENOUS | Status: DC
  Administered 2016-11-15: 06:00:00 via INTRAVENOUS

## 2016-11-14 MED ORDER — PHENOL 1.4 % MT LIQD
1.0000 | OROMUCOSAL | Status: DC | PRN
  Filled 2016-11-14: qty 177

## 2016-11-14 MED ORDER — VITAMIN D 1000 UNITS PO TABS
2000.0000 [IU] | ORAL_TABLET | Freq: Every day | ORAL | Status: DC
  Administered 2016-11-15 – 2016-11-16 (×2): 2000 [IU] via ORAL
  Filled 2016-11-14: qty 2

## 2016-11-14 MED ORDER — GLYCOPYRROLATE 0.2 MG/ML IJ SOLN
INTRAMUSCULAR | Status: AC
  Filled 2016-11-14: qty 1

## 2016-11-14 MED ORDER — ACETAMINOPHEN 325 MG PO TABS
650.0000 mg | ORAL_TABLET | Freq: Four times a day (QID) | ORAL | Status: DC | PRN
Start: 2016-11-14 — End: 2016-11-16

## 2016-11-14 MED ORDER — MIDAZOLAM HCL 2 MG/2ML IJ SOLN
INTRAMUSCULAR | Status: AC
  Filled 2016-11-14: qty 2

## 2016-11-14 MED ORDER — ACETAMINOPHEN 10 MG/ML IV SOLN
INTRAVENOUS | Status: AC
  Filled 2016-11-14: qty 100

## 2016-11-14 MED ORDER — FAMOTIDINE 20 MG PO TABS
20.0000 mg | ORAL_TABLET | Freq: Once | ORAL | Status: AC
  Administered 2016-11-14: 20 mg via ORAL

## 2016-11-14 MED ORDER — BUPIVACAINE HCL (PF) 0.5 % IJ SOLN
INTRAMUSCULAR | Status: DC | PRN
  Administered 2016-11-14: 3 mL

## 2016-11-14 MED ORDER — ALUM & MAG HYDROXIDE-SIMETH 200-200-20 MG/5ML PO SUSP: 30.0000 mL | ORAL | Status: DC | PRN

## 2016-11-14 MED ORDER — ONDANSETRON HCL 4 MG PO TABS: 4.0000 mg | ORAL_TABLET | Freq: Four times a day (QID) | ORAL | Status: DC | PRN

## 2016-11-14 MED ORDER — NEOMYCIN-POLYMYXIN B GU 40-200000 IR SOLN
Status: AC
  Filled 2016-11-14: qty 20

## 2016-11-14 MED ORDER — CEFAZOLIN SODIUM-DEXTROSE 2-4 GM/100ML-% IV SOLN
INTRAVENOUS | Status: AC
  Filled 2016-11-14: qty 100

## 2016-11-14 MED ORDER — CHLORHEXIDINE GLUCONATE 4 % EX LIQD: 60.0000 mL | Freq: Once | CUTANEOUS | Status: DC

## 2016-11-14 MED ORDER — MIDAZOLAM HCL 5 MG/5ML IJ SOLN
INTRAMUSCULAR | Status: DC | PRN
  Administered 2016-11-14: 1 mg via INTRAVENOUS

## 2016-11-14 MED ORDER — PROPOFOL 10 MG/ML IV BOLUS
INTRAVENOUS | Status: AC
  Filled 2016-11-14: qty 20

## 2016-11-14 MED ORDER — SODIUM CHLORIDE 0.9 % IV SOLN
INTRAVENOUS | Status: DC | PRN
  Administered 2016-11-14: 60 mL

## 2016-11-14 MED ORDER — TRANEXAMIC ACID 1000 MG/10ML IV SOLN
1000.0000 mg | Freq: Once | INTRAVENOUS | Status: AC
  Administered 2016-11-14: 1000 mg via INTRAVENOUS
  Filled 2016-11-14: qty 10

## 2016-11-14 MED ORDER — DIPHENHYDRAMINE HCL 12.5 MG/5ML PO ELIX: 12.5000 mg | ORAL_SOLUTION | ORAL | Status: DC | PRN

## 2016-11-14 SURGICAL SUPPLY — 62 items
BATTERY INSTRU NAVIGATION (MISCELLANEOUS) ×12 IMPLANT
BLADE SAW 1 (BLADE) ×3 IMPLANT
BLADE SAW 1/2 (BLADE) ×3 IMPLANT
BLADE SAW 70X12.5 (BLADE) IMPLANT
CANISTER SUCT 1200ML W/VALVE (MISCELLANEOUS) ×3 IMPLANT
CANISTER SUCT 3000ML PPV (MISCELLANEOUS) ×6 IMPLANT
CAPT KNEE TOTAL 3 ATTUNE ×3 IMPLANT
CATH TRAY METER 16FR LF (MISCELLANEOUS) ×3 IMPLANT
CEMENT HV SMART SET (Cement) ×6 IMPLANT
COOLER POLAR GLACIER W/PUMP (MISCELLANEOUS) ×3 IMPLANT
CUFF TOURN 24 STER (MISCELLANEOUS) IMPLANT
CUFF TOURN 30 STER DUAL PORT (MISCELLANEOUS) ×3 IMPLANT
DRAPE SHEET LG 3/4 BI-LAMINATE (DRAPES) ×3 IMPLANT
DRSG DERMACEA 8X12 NADH (GAUZE/BANDAGES/DRESSINGS) ×3 IMPLANT
DRSG OPSITE POSTOP 4X14 (GAUZE/BANDAGES/DRESSINGS) ×3 IMPLANT
DRSG TEGADERM 4X4.75 (GAUZE/BANDAGES/DRESSINGS) ×3 IMPLANT
DURAPREP 26ML APPLICATOR (WOUND CARE) ×3 IMPLANT
ELECT CAUTERY BLADE 6.4 (BLADE) ×3 IMPLANT
ELECT REM PT RETURN 9FT ADLT (ELECTROSURGICAL) ×3
ELECTRODE REM PT RTRN 9FT ADLT (ELECTROSURGICAL) ×1 IMPLANT
EX-PIN ORTHOLOCK NAV 4X150 (PIN) ×6 IMPLANT
GLOVE BIOGEL M STRL SZ7.5 (GLOVE) ×9 IMPLANT
GLOVE BIOGEL PI IND STRL 9 (GLOVE) ×1 IMPLANT
GLOVE BIOGEL PI INDICATOR 9 (GLOVE) ×2
GLOVE INDICATOR 8.0 STRL GRN (GLOVE) ×6 IMPLANT
GLOVE SURG SYN 9.0  PF PI (GLOVE) ×2
GLOVE SURG SYN 9.0 PF PI (GLOVE) ×1 IMPLANT
GOWN STRL REUS W/ TWL LRG LVL3 (GOWN DISPOSABLE) ×4 IMPLANT
GOWN STRL REUS W/TWL 2XL LVL3 (GOWN DISPOSABLE) ×3 IMPLANT
GOWN STRL REUS W/TWL LRG LVL3 (GOWN DISPOSABLE) ×8
HEMOVAC 400CC 10FR (MISCELLANEOUS) ×3 IMPLANT
HOLDER FOLEY CATH W/STRAP (MISCELLANEOUS) ×3 IMPLANT
HOOD PEEL AWAY FLYTE STAYCOOL (MISCELLANEOUS) ×6 IMPLANT
KIT RM TURNOVER STRD PROC AR (KITS) ×3 IMPLANT
KNIFE SCULPS 14X20 (INSTRUMENTS) ×3 IMPLANT
LABEL OR SOLS (LABEL) ×3 IMPLANT
NDL SAFETY 18GX1.5 (NEEDLE) ×3 IMPLANT
NEEDLE SPNL 20GX3.5 QUINCKE YW (NEEDLE) ×3 IMPLANT
NS IRRIG 500ML POUR BTL (IV SOLUTION) ×3 IMPLANT
PACK TOTAL KNEE (MISCELLANEOUS) ×3 IMPLANT
PAD WRAPON POLAR KNEE (MISCELLANEOUS) ×1 IMPLANT
PIN DRILL QUICK PACK ×3 IMPLANT
PIN FIXATION 1/8DIA X 3INL (PIN) ×3 IMPLANT
PULSAVAC PLUS IRRIG FAN TIP (DISPOSABLE) ×3
SOL .9 NS 3000ML IRR  AL (IV SOLUTION) ×2
SOL .9 NS 3000ML IRR UROMATIC (IV SOLUTION) ×1 IMPLANT
SOL PREP PVP 2OZ (MISCELLANEOUS) ×3
SOLUTION PREP PVP 2OZ (MISCELLANEOUS) ×1 IMPLANT
SPONGE DRAIN TRACH 4X4 STRL 2S (GAUZE/BANDAGES/DRESSINGS) ×3 IMPLANT
STAPLER SKIN PROX 35W (STAPLE) ×3 IMPLANT
STRAP TIBIA SHORT (MISCELLANEOUS) ×3 IMPLANT
SUCTION FRAZIER HANDLE 10FR (MISCELLANEOUS) ×2
SUCTION TUBE FRAZIER 10FR DISP (MISCELLANEOUS) ×1 IMPLANT
SUT VIC AB 0 CT1 36 (SUTURE) ×3 IMPLANT
SUT VIC AB 1 CT1 36 (SUTURE) ×6 IMPLANT
SUT VIC AB 2-0 CT2 27 (SUTURE) ×3 IMPLANT
SYR 20CC LL (SYRINGE) ×3 IMPLANT
SYR 30ML LL (SYRINGE) ×6 IMPLANT
TIP FAN IRRIG PULSAVAC PLUS (DISPOSABLE) ×1 IMPLANT
TOWEL OR 17X26 4PK STRL BLUE (TOWEL DISPOSABLE) ×3 IMPLANT
TOWER CARTRIDGE SMART MIX (DISPOSABLE) ×3 IMPLANT
WRAPON POLAR PAD KNEE (MISCELLANEOUS) ×3

## 2016-11-14 NOTE — NC FL2 (Signed)
[place] LEVEL OF CARE SCREENING TOOL     IDENTIFICATION  Patient Name: Mary Elliott Birthdate: [birth year] Sex: female Admission Date (Current Location): 11/14/2016  [place] and [place] Number:  Engineering geologist and Address:  [facility], [address], [place], [place] [address]      Provider Number: [id number]  Attending Physician Name and Address:  Dereck Leep, MD  Relative Name and Phone Number:       Current Level of Care: Hospital Recommended Level of Care: [place] Prior Approval Number:    Date Approved/Denied:   PASRR Number:  [id number])  Discharge Plan: SNF    Current Diagnoses: Patient Active Problem List   Diagnosis Date Noted  . S/P total knee arthroplasty 11/14/2016    Orientation RESPIRATION BLADDER Height & Weight     Self, Time, Situation, Place  Normal Continent Weight: 163 lb (73.9 kg) Height:  5\' 2"  (157.5 cm)  BEHAVIORAL SYMPTOMS/MOOD NEUROLOGICAL BOWEL NUTRITION STATUS   (none)  (none) Continent Diet (Diet: Clear Liquid to be advanced. )  AMBULATORY STATUS COMMUNICATION OF NEEDS Skin   Extensive Assist Verbally Surgical wounds (Incision: Left Knee )                       Personal Care Assistance Level of Assistance  Bathing, Feeding, Dressing Bathing Assistance: Limited assistance Feeding assistance: Independent Dressing Assistance: Limited assistance     Functional Limitations Info  Sight, Hearing, Speech Sight Info: Impaired Hearing Info: Impaired Speech Info: Adequate    SPECIAL CARE FACTORS FREQUENCY  PT (By licensed PT), OT (By licensed OT)     PT Frequency:  (5) OT Frequency:  (5)            Contractures      Additional Factors Info  Code Status, Allergies Code Status Info:  (Full Code. ) Allergies Info:  (Nitrofurantoin, Vesicare Solifenacin Succinate, Duloxetine, Nsaids, Oxybutynin, Solifenacin, Sulfa Antibiotics, Tegaserod)            Current Medications (11/14/2016):  This is the current hospital active medication list Current Facility-Administered Medications  Medication Dose Route Frequency Provider Last Rate Last Dose  . 0.9 %  sodium chloride infusion   Intravenous Continuous Hooten, Laurice Record, MD      . acetaminophen (OFIRMEV) IV 1,000 mg  1,000 mg Intravenous Q6H Hooten, Laurice Record, MD 400 mL/hr at 11/14/16 1335 1,000 mg at 11/14/16 1335  . acetaminophen (TYLENOL) tablet 650 mg  650 mg Oral Q6H PRN Hooten, Laurice Record, MD       Or  . acetaminophen (TYLENOL) suppository 650 mg  650 mg Rectal Q6H PRN Hooten, Laurice Record, MD      . acidophilus (RISAQUAD) capsule 1 capsule  1 capsule Oral Daily Hooten, Laurice Record, MD      . alum & mag hydroxide-simeth (MAALOX/MYLANTA) 200-200-20 MG/5ML suspension 30 mL  30 mL Oral Q4H PRN Hooten, Laurice Record, MD      . bisacodyl (DULCOLAX) suppository 10 mg  10 mg Rectal Daily PRN Hooten, Laurice Record, MD      . ceFAZolin (ANCEF) IVPB 2g/100 mL premix  2 g Intravenous Q6H Hooten, Laurice Record, MD 200 mL/hr at 11/14/16 1335 2 g at 11/14/16 1335  . cholecalciferol (VITAMIN D) tablet 2,000 Units  2,000 Units Oral Daily Hooten, Laurice Record, MD      . diphenhydrAMINE (BENADRYL) 12.5 MG/5ML elixir 12.5-25 mg  12.5-25 mg Oral Q4H PRN Hooten, Laurice Record,  MD      . Derrill Memo ON 11/15/2016] enoxaparin (LOVENOX) injection 30 mg  30 mg Subcutaneous Q12H Hooten, Laurice Record, MD      . estradiol (ESTRACE) tablet 0.25 mg  0.25 mg Oral Daily Hooten, Laurice Record, MD      . ferrous sulfate tablet 325 mg  325 mg Oral BID WC Hooten, Laurice Record, MD      . Derrill Memo ON 11/15/2016] levothyroxine (SYNTHROID, LEVOTHROID) tablet 100 mcg  100 mcg Oral QAC breakfast Hooten, Laurice Record, MD      . magnesium hydroxide (MILK OF MAGNESIA) suspension 30 mL  30 mL Oral Daily PRN Hooten, Laurice Record, MD      . menthol-cetylpyridinium (CEPACOL) lozenge 3 mg  1 lozenge Oral PRN Hooten, Laurice Record, MD       Or  . phenol (CHLORASEPTIC) mouth spray 1 spray  1 spray Mouth/Throat PRN Hooten,  Laurice Record, MD      . metoCLOPramide (REGLAN) tablet 10 mg  10 mg Oral TID AC & HS Hooten, Laurice Record, MD      . Derrill Memo ON 11/15/2016] metoprolol succinate (TOPROL-XL) 24 hr tablet 100 mg  100 mg Oral Daily Hooten, Laurice Record, MD      . morphine 2 MG/ML injection 2 mg  2 mg Intravenous Q2H PRN Hooten, Laurice Record, MD      . ondansetron (ZOFRAN) tablet 4 mg  4 mg Oral Q6H PRN Hooten, Laurice Record, MD       Or  . ondansetron (ZOFRAN) injection 4 mg  4 mg Intravenous Q6H PRN Hooten, Laurice Record, MD      . oxyCODONE (Oxy IR/ROXICODONE) immediate release tablet 5-10 mg  5-10 mg Oral Q4H PRN Hooten, Laurice Record, MD      . pantoprazole (PROTONIX) EC tablet 40 mg  40 mg Oral BID Hooten, Laurice Record, MD      . senna-docusate (Senokot-S) tablet 1 tablet  1 tablet Oral BID Hooten, Laurice Record, MD      . Derrill Memo ON 11/15/2016] sertraline (ZOLOFT) tablet 75 mg  75 mg Oral Daily Hooten, Laurice Record, MD      . sodium phosphate (FLEET) 7-19 GM/118ML enema 1 enema  1 enema Rectal Once PRN Hooten, Laurice Record, MD      . traMADol Veatrice Bourbon) tablet 50-100 mg  50-100 mg Oral Q4H PRN Hooten, Laurice Record, MD      . vitamin B-12 (CYANOCOBALAMIN) tablet 1,000 mcg  1,000 mcg Oral Daily Hooten, Laurice Record, MD         Discharge Medications: Please see discharge summary for a list of discharge medications.  Relevant Imaging Results:  Relevant Lab Results:   Additional Information  (SSN: [id number])  Sharunda Salmon, Veronia Beets, LCSW

## 2016-11-14 NOTE — Anesthesia Procedure Notes (Signed)
Spinal  Patient location during procedure: OR Start time: 11/14/2016 7:23 AM End time: 11/14/2016 7:28 AM Preanesthetic Checklist Completed: patient identified, site marked, surgical consent, pre-op evaluation, timeout performed, IV checked, risks and benefits discussed and monitors and equipment checked Spinal Block Patient position: sitting Prep: Betadine Patient monitoring: heart rate, continuous pulse ox, blood pressure and cardiac monitor Approach: midline Location: L4-5 Injection technique: single-shot Needle Needle type: Introducer and Pencan  Needle gauge: 24 G Needle length: 9 cm Additional Notes Negative paresthesia. Negative blood return. Positive free-flowing CSF. Expiration date of kit checked and confirmed. Patient tolerated procedure well, without complications.

## 2016-11-14 NOTE — Anesthesia Post-op Follow-up Note (Cosign Needed)
Anesthesia QCDR form completed.        

## 2016-11-14 NOTE — Progress Notes (Signed)
Patient is admitted to room 155 from surgery via room bed. A&O x 4. TEDs, foot pumps, and polar care in place. Post-op dressing to left knee CD&I. Hemovac in place. Bed alarm on for safety. Oriented to room, call light, TV, and bed controls.

## 2016-11-14 NOTE — Anesthesia Preprocedure Evaluation (Signed)
Anesthesia Evaluation  Patient identified by MRN, date of birth, ID band Patient awake    Reviewed: Allergy & Precautions, NPO status , Patient's Chart, lab work & pertinent test results  History of Anesthesia Complications Negative for: history of anesthetic complications  Airway Mallampati: II       Dental   Pulmonary neg pulmonary ROS,           Cardiovascular hypertension, Pt. on medications and Pt. on home beta blockers      Neuro/Psych Anxiety negative neurological ROS     GI/Hepatic negative GI ROS, Neg liver ROS,   Endo/Other  Hypothyroidism   Renal/GU Renal disease (stones)     Musculoskeletal   Abdominal   Peds  Hematology   Anesthesia Other Findings   Reproductive/Obstetrics                             Anesthesia Physical Anesthesia Plan  ASA: II  Anesthesia Plan: Spinal   Post-op Pain Management:    Induction:   PONV Risk Score and Plan:   Airway Management Planned:   Additional Equipment:   Intra-op Plan:   Post-operative Plan:   Informed Consent: I have reviewed the patients History and Physical, chart, labs and discussed the procedure including the risks, benefits and alternatives for the proposed anesthesia with the patient or authorized representative who has indicated his/her understanding and acceptance.     Plan Discussed with:   Anesthesia Plan Comments:         Anesthesia Quick Evaluation

## 2016-11-14 NOTE — H&P (Signed)
The patient has been re-examined, and the chart reviewed, and there have been no interval changes to the documented history and physical.    The risks, benefits, and alternatives have been discussed at length. The patient expressed understanding of the risks benefits and agreed with plans for surgical intervention.  Meerab Maselli P. Warrene Kapfer, Jr. M.D.    

## 2016-11-14 NOTE — Evaluation (Signed)
Physical Therapy Evaluation Patient Details Name: Mary Elliott MRN: [id number] DOB: 07-Sep-1940 Today's Date: 11/14/2016   History of Present Illness  Pt underwent L TKR without reported post-op complications. She is POD#0 at time of initial evaluation. PMH: HTN, anxiety, and hypothyroidism  Clinical Impression  Pt admitted with above diagnosis. Pt currently with functional limitations due to the deficits listed below (see PT Problem List).  Pt demonstrates supervision only for bed mobility. CGA for transfers and ambulation. She is able to ambulate from bed to recliner with rolling walker. Cues provided for proper sequencing with walker. Fair weight acceptance to LLE. VSS and no DOE reported. No LLE buckling. AAROM is good at -2 to 90 degrees and is limited by dressing and mild pain. Pt should be appropriate to return home at discharge with [facility] PT. She should use her rolling walker at discharge. Pt will benefit from PT services to address deficits in strength, balance, and mobility in order to return to full function at home.     Follow Up Recommendations Home health PT    Equipment Recommendations  None recommended by PT;Other (comment) (Use rolling walker at discharge)    Recommendations for Other Services OT consult     Precautions / Restrictions Precautions Precautions: Knee Precaution Booklet Issued: Yes (comment) Required Braces or Orthoses: Knee Immobilizer - Left Knee Immobilizer - Left: Discontinue once straight leg raise with < 10 degree lag Restrictions Weight Bearing Restrictions: Yes LLE Weight Bearing: Weight bearing as tolerated      Mobility  Bed Mobility Overal bed mobility: Needs Assistance Bed Mobility: Supine to Sit     Supine to sit: Supervision     General bed mobility comments: Cues for sequencing with bed mobility. HOB elevated and bed rails utilized. No external assist needed  Transfers Overall transfer level: Needs assistance Equipment used:  Rolling walker (2 wheeled) Transfers: Sit to/from Stand Sit to Stand: Min guard         General transfer comment: Safe sequencing demonstrated. Fair weight acceptance to LLE and good L knee flexion prior to transfer. UE support on walker in standing but not needed to maintain balance  Ambulation/Gait Ambulation/Gait assistance: Min guard Ambulation Distance (Feet): 5 Feet Assistive device: Rolling walker (2 wheeled) Gait Pattern/deviations: Decreased step length - right;Decreased step length - left;Decreased weight shift to left;Decreased stance time - left Gait velocity: Decreased Gait velocity interpretation: <1.8 ft/sec, indicative of risk for recurrent falls General Gait Details: Pt able to ambulate from bed to recliner with rolling walker. Cues provided for proper sequencing with walker. Fair weight acceptance to LLE. VSS and no DOE reported. No LLE buckling.  Stairs            Wheelchair Mobility    Modified [name] (Stroke Patients Only)       Balance Overall balance assessment: Needs assistance Sitting-balance support: No upper extremity supported Sitting balance-Leahy Scale: Good     Standing balance support: No upper extremity supported Standing balance-Leahy Scale: Fair Standing balance comment: Able to maintain balance without UE support in standing                             Pertinent Vitals/Pain Pain Assessment: No/denies pain    Home Living Family/patient expects to be discharged to:: Private residence Living Arrangements: Spouse/significant other Available Help at Discharge: Family Type of Home: House Home Access: Stairs to enter Entrance Stairs-Rails: Psychiatric nurse of Steps: 3 Home Layout: One  level Home Equipment: Walker - 2 wheels;Cane - single point;Bedside commode;Shower seat - built in;Grab bars - tub/shower      Prior Function Level of Independence: Independent         Comments: Independent with  ADLs/IADLs. No AD for ambulation. No falls     Hand Dominance   Dominant Hand: Right    Extremity/Trunk Assessment   Upper Extremity Assessment Upper Extremity Assessment: Overall WFL for tasks assessed    Lower Extremity Assessment Lower Extremity Assessment: LLE deficits/detail LLE Deficits / Details: Reports intact sensation to light touch. Full DF/PF. Able to perform SLR and SAQ without assistance       Communication   Communication: No difficulties  Cognition Arousal/Alertness: Awake/alert Behavior During Therapy: WFL for tasks assessed/performed Overall Cognitive Status: Within Functional Limits for tasks assessed                                        General Comments      Exercises Total Joint Exercises Ankle Circles/Pumps: AROM;Both;10 reps;Supine Quad Sets: Strengthening;Both;10 reps;Supine Gluteal Sets: Strengthening;Both;10 reps;Supine Towel Squeeze: Strengthening;Both;10 reps;Supine Short Arc Quad: Strengthening;Left;10 reps;Supine Heel Slides: Strengthening;10 reps;Supine;Left Hip ABduction/ADduction: Strengthening;Left;10 reps;Supine Straight Leg Raises: Strengthening;Left;10 reps;Supine Goniometric ROM: -2 to 90 AAROM, limited by dressings and mild pain   Assessment/Plan    PT Assessment Patient needs continued PT services  PT Problem List Decreased strength;Decreased range of motion;Decreased balance;Decreased mobility;Decreased knowledge of use of DME;Pain       PT Treatment Interventions DME instruction;Gait training;Stair training;Functional mobility training;Therapeutic activities;Therapeutic exercise;Balance training;Neuromuscular re-education;Patient/family education;Manual techniques    PT Goals (Current goals can be found in the Care Plan section)  Acute Rehab PT Goals Patient Stated Goal: Return to prior function with less pain PT Goal Formulation: With patient/family Time For Goal Achievement: 11/28/16 Potential to  Achieve Goals: Good    Frequency BID   Barriers to discharge        Co-evaluation               AM-PAC PT "6 Clicks" Daily Activity  Outcome Measure Difficulty turning over in bed (including adjusting bedclothes, sheets and blankets)?: A Little Difficulty moving from lying on back to sitting on the side of the bed? : A Little Difficulty sitting down on and standing up from a chair with arms (e.g., wheelchair, bedside commode, etc,.)?: A Little Help needed moving to and from a bed to chair (including a wheelchair)?: A Little Help needed walking in hospital room?: A Little Help needed climbing 3-5 steps with a railing? : A Lot 6 Click Score: 17    End of Session Equipment Utilized During Treatment: Gait belt Activity Tolerance: Patient tolerated treatment well Patient left: in chair;with call bell/phone within reach;with chair alarm set;with SCD's reapplied;Other (comment) (towel roll under heel, polar care in place) Nurse Communication: Mobility status PT Visit Diagnosis: Unsteadiness on feet (R26.81);Muscle weakness (generalized) (M62.81)    Time: 5784-6962 PT Time Calculation (min) (ACUTE ONLY): 39 min   Charges:   PT Evaluation $PT Eval Low Complexity: 1 Procedure PT Treatments $Therapeutic Exercise: 8-22 mins   PT G Codes:   PT G-Codes **NOT FOR INPATIENT CLASS** Functional Assessment Tool Used: AM-PAC 6 Clicks Basic Mobility Functional Limitation: Mobility: Walking and moving around Mobility: Walking and Moving Around Current Status (X5284): At least 40 percent but less than 60 percent impaired, limited or restricted Mobility: Walking and Moving  Around Goal Status [phone]): At least 1 percent but less than 20 percent impaired, limited or restricted   Phillips Grout PT, DPT    Huprich,Jason 11/14/2016, 5:03 PM

## 2016-11-14 NOTE — Op Note (Signed)
OPERATIVE NOTE  DATE OF SURGERY:  11/14/2016  PATIENT NAME:  Mary Elliott   DOB: Jan 27, 1941  MRN: [id number]  PRE-OPERATIVE DIAGNOSIS: Degenerative arthrosis of the left knee, primary  POST-OPERATIVE DIAGNOSIS:  Same  PROCEDURE:  Left total knee arthroplasty using computer-assisted navigation  SURGEON:  Marciano Sequin. M.D.  ASSISTANT:  Vance Peper, PA (present and scrubbed throughout the case, critical for assistance with exposure, retraction, instrumentation, and closure)  ANESTHESIA: spinal  ESTIMATED BLOOD LOSS: 50 mL  FLUIDS REPLACED: 1200 mL of crystalloid  TOURNIQUET TIME: 82 minutes  DRAINS: 2 medium Hemovac drains  SOFT TISSUE RELEASES: Anterior cruciate ligament, posterior cruciate ligament, deep medial collateral ligament, patellofemoral ligament  IMPLANTS UTILIZED: DePuy Attune size 6N posterior stabilized femoral component (cemented), size 5 rotating platform tibial component (cemented), 35 mm medialized dome patella (cemented), and a 5 mm stabilized rotating platform polyethylene insert.  INDICATIONS FOR SURGERY: Mary Elliott is a 76 y.o. year old female with a long history of progressive knee pain. X-rays demonstrated severe degenerative changes in tricompartmental fashion. The patient had not seen any significant improvement despite conservative nonsurgical intervention. After discussion of the risks and benefits of surgical intervention, the patient expressed understanding of the risks benefits and agree with plans for total knee arthroplasty.   The risks, benefits, and alternatives were discussed at length including but not limited to the risks of infection, bleeding, nerve injury, stiffness, blood clots, the need for revision surgery, cardiopulmonary complications, among others, and they were willing to proceed.  PROCEDURE IN DETAIL: The patient was brought into the operating room and, after adequate spinal anesthesia was achieved, a tourniquet was  placed on the patient's upper thigh. The patient's knee and leg were cleaned and prepped with alcohol and DuraPrep and draped in the usual sterile fashion. A "timeout" was performed as per usual protocol. The lower extremity was exsanguinated using an Esmarch, and the tourniquet was inflated to 300 mmHg. An anterior longitudinal incision was made followed by a standard mid vastus approach. The deep fibers of the medial collateral ligament were elevated in a subperiosteal fashion off of the medial flare of the tibia so as to maintain a continuous soft tissue sleeve. The patella was subluxed laterally and the patellofemoral ligament was incised. Inspection of the knee demonstrated severe degenerative changes with full-thickness loss of articular cartilage. Osteophytes were debrided using a rongeur. Anterior and posterior cruciate ligaments were excised. Two 4.0 mm Schanz pins were inserted in the femur and into the tibia for attachment of the array of trackers used for computer-assisted navigation. Hip center was identified using a circumduction technique. Distal landmarks were mapped using the computer. The distal femur and proximal tibia were mapped using the computer. The distal femoral cutting guide was positioned using computer-assisted navigation so as to achieve a 5 distal valgus cut. The femur was sized and it was felt that a size 6N femoral component was appropriate. A size 6 femoral cutting guide was positioned and the anterior cut was performed and verified using the computer. This was followed by completion of the posterior and chamfer cuts. Femoral cutting guide for the central box was then positioned in the center box cut was performed.  Attention was then directed to the proximal tibia. Medial and lateral menisci were excised. The extramedullary tibial cutting guide was positioned using computer-assisted navigation so as to achieve a 0 varus-valgus alignment and 3 posterior slope. The cut was  performed and verified using the computer. The proximal tibia was sized  and it was felt that a size 5 tibial tray was appropriate. Tibial and femoral trials were inserted followed by insertion of a 5 mm polyethylene insert. This allowed for excellent mediolateral soft tissue balancing both in flexion and in full extension. Finally, the patella was cut and prepared so as to accommodate a 35 mm medialized dome patella. A patella trial was placed and the knee was placed through a range of motion with excellent patellar tracking appreciated. The femoral trial was removed after debridement of posterior osteophytes. The central post-hole for the tibial component was reamed followed by insertion of a keel punch. Tibial trials were then removed. Cut surfaces of bone were irrigated with copious amounts of normal saline with antibiotic solution using pulsatile lavage and then suctioned dry. Polymethylmethacrylate cement was prepared in the usual fashion using a vacuum mixer. Cement was applied to the cut surface of the proximal tibia as well as along the undersurface of a size 5 rotating platform tibial component. Tibial component was positioned and impacted into place. Excess cement was removed using [employer] fast streamer. Cement was then applied to the cut surfaces of the femur as well as along the posterior flanges of the size 6N femoral component. The femoral component was positioned and impacted into place. Excess cement was removed using [employer] fast streamer. A 5 mm polyethylene trial was inserted and the knee was brought into full extension with steady axial compression applied. Finally, cement was applied to the backside of a 35 mm medialized dome patella and the patellar component was positioned and patellar clamp applied. Excess cement was removed using [employer] fast streamer. After adequate curing of the cement, the tourniquet was deflated after a total tourniquet time of 82 minutes. Hemostasis was achieved using electrocautery. The  knee was irrigated with copious amounts of normal saline with antibiotic solution using pulsatile lavage and then suctioned dry. 20 mL of 1.3% Exparel and 60 mL of 0.25% Marcaine in 40 mL of normal saline was injected along the posterior capsule, medial and lateral gutters, and along the arthrotomy site. A 5 mm stabilized rotating platform polyethylene insert was inserted and the knee was placed through a range of motion with excellent mediolateral soft tissue balancing appreciated and excellent patellar tracking noted. 2 medium drains were placed in the wound bed and brought out through separate stab incisions. The medial parapatellar portion of the incision was reapproximated using interrupted sutures of #1 Vicryl. Subcutaneous tissue was approximated in layers using first #0 Vicryl followed #2-0 Vicryl. The skin was approximated with skin staples. A sterile dressing was applied.  The patient tolerated the procedure well and was transported to the recovery room in stable condition.    Shinita Mac P. Holley Bouche., M.D.

## 2016-11-14 NOTE — Transfer of Care (Signed)
Immediate Anesthesia Transfer of Care Note  Patient: Mary Elliott  Procedure(s) Performed: Procedure(s): COMPUTER ASSISTED TOTAL KNEE ARTHROPLASTY (Left)  Patient Location: PACU  Anesthesia Type:Spinal  Level of Consciousness: awake, alert , oriented and patient cooperative  Airway & Oxygen Therapy: Patient Spontanous Breathing and Patient connected to nasal cannula oxygen  Post-op Assessment: Report given to RN and Post -op Vital signs reviewed and stable  Post vital signs: Reviewed and stable  Last Vitals:  Vitals:   11/14/16 0607  BP: (!) 168/96  Pulse: 65  Resp: 16  Temp: 36.6 C    Last Pain:  Vitals:   11/14/16 0607  TempSrc: Tympanic  PainSc: 2          Complications: No apparent anesthesia complications

## 2016-11-15 LAB — BASIC METABOLIC PANEL
ANION GAP: 4 — AB (ref 5–15)
BUN: 14 mg/dL (ref 6–20)
CO2: 24 mmol/L (ref 22–32)
Calcium: 8.5 mg/dL — ABNORMAL LOW (ref 8.9–10.3)
Chloride: 109 mmol/L (ref 101–111)
Creatinine, Ser: 0.83 mg/dL (ref 0.44–1.00)
GFR calc Af Amer: >60 mL/min (ref >60)
GFR calc non Af Amer: >60 mL/min (ref >60)
GLUCOSE: 134 mg/dL — AB (ref 65–99)
POTASSIUM: 3.9 mmol/L (ref 3.5–5.1)
Sodium: 137 mmol/L (ref 135–145)

## 2016-11-15 LAB — CBC
HEMATOCRIT: 34.3 % — AB (ref 35.0–47.0)
Hemoglobin: 11.7 g/dL — ABNORMAL LOW (ref 12.0–16.0)
MCH: 31.9 pg (ref 26.0–34.0)
MCHC: 34.3 g/dL (ref 32.0–36.0)
MCV: 93.1 fL (ref 80.0–100.0)
Platelets: 241 10*3/uL (ref 150–440)
RBC: 3.68 MIL/uL — AB (ref 3.80–5.20)
RDW: 13.7 % (ref 11.5–14.5)
WBC: 12.6 10*3/uL — AB (ref 3.6–11.0)

## 2016-11-15 NOTE — Anesthesia Postprocedure Evaluation (Signed)
Anesthesia Post Note  Patient: [name]  Procedure(s) Performed: Procedure(s) (LRB): COMPUTER ASSISTED TOTAL KNEE ARTHROPLASTY (Left)  Patient location during evaluation: Nursing Unit Anesthesia Type: Spinal Level of consciousness: awake, awake and alert and oriented Pain management: pain level controlled Vital Signs Assessment: post-procedure vital signs reviewed and stable Respiratory status: spontaneous breathing, nonlabored ventilation and respiratory function stable Cardiovascular status: blood pressure returned to baseline and stable Postop Assessment: no headache and no backache Anesthetic complications: no     Last Vitals:  Vitals:   11/14/16 2033 11/14/16 2340  BP: (!) 92/51 (!) 92/52  Pulse: (!) 48 (!) 54  Resp:  18  Temp:  36.6 C    Last Pain:  Vitals:   11/14/16 2340  TempSrc: Oral  PainSc:                  [name]

## 2016-11-15 NOTE — Care Management Note (Signed)
Case Management Note  Patient Details  Name: Mary Elliott MRN: [id number] Date of Birth: 07/06/1940  Subjective/Objective:  POD # 1 left TKA. Met with patient at bedside to discuss discharge planning.Patient lives in a ranch style home with her husband. She has a walker. Offered choice of home health agencies. She prefers [facility]. Referral to [facility] for HHPT. Phamacy: [facility] [address] [phone]. Call Lovenox 40 mg #14 no refills.                   Action/Plan: [facility] for HHPT, Lovenox callled in. No DME  Expected Discharge Date:  11/16/16               Expected Discharge Plan:  Home w Home Health Services  In-House Referral:     Discharge planning Services  CM Consult  Post Acute Care Choice:  Home Health Choice offered to:  Patient  DME Arranged:    DME Agency:     HH Arranged:  PT HH Agency:  [facility] (formerly [facility])  Status of Service:  In process, will continue to follow  If discussed at Long Length of Stay Meetings, dates discussed:    Additional Comments:  Lisa M Jacobs, RN 11/15/2016, 11:52 AM  

## 2016-11-15 NOTE — Progress Notes (Signed)
Clinical Social Worker (CSW) received SNF consult. PT is recommending home health. RN case manager aware of above. Please reconsult if future social work needs arise. CSW signing off.   Dawanda Mapel, LCSW [phone] 

## 2016-11-15 NOTE — Progress Notes (Signed)
ORTHOPAEDICS PROGRESS NOTE  PATIENT NAME: Mary Elliott DOB: 08/16/1940  MRN: [id number]  POD # 1: Left total knee arthroplasty  Subjective: Pain has been under good control. The patient denies any nausea. The patient tolerated physical therapy well yesterday afternoon. The patient's daughter spent the night.  Objective: Vital signs in last 24 hours: Temp:  [97.1 F (36.2 C)-97.8 F (36.6 C)] 97.8 F (36.6 C) (07/09 2340) Pulse Rate:  [44-66] 54 (07/09 2340) Resp:  [11-20] 18 (07/09 2340) BP: (84-141)/(51-80) 92/52 (07/09 2340) SpO2:  [97 %-100 %] 97 % (07/09 2340) Weight:  [73.9 kg (163 lb)] 73.9 kg (163 lb) (07/09 1224)  Intake/Output from previous day: 07/09 0701 - 07/10 0700 In: 2520 [P.O.:240; I.V.:1200; IV Piggyback:200] Out: 2265 [Urine:1975; Drains:240; Blood:50]   Recent Labs  11/15/16 0347  WBC 12.6*  HGB 11.7*  HCT 34.3*  PLT 241  K 3.9  CL 109  CO2 24  BUN 14  CREATININE 0.83  GLUCOSE 134*  CALCIUM 8.5*    EXAM General: Well-developed well-nourished female seen in no acute distress Lungs: clear to auscultation Cardiac: normal rate, regular rhythm, normal S1, S2, no murmurs, rubs, clicks or gallops Left lower extremity: The patient is able to perform independent straight leg raise. Dressing is dry and intact. Hemovac drain is in place. Bone foam is in place. Homans test is negative. Neurologic: Awake, alert, and oriented. Sensory motor function are grossly intact.  Assessment: Status post left total knee arthroplasty  Secondary diagnoses: Nephrolithiasis Hypothyroidism Hypertension Anxiety  Plan: Today's goal were reviewed with the patient.  Continue with physical therapy and occupational therapy as per total knee arthroplasty rehabilitation protocol. Plan is to go Home after hospital stay. DVT Prophylaxis - Lovenox, Foot Pumps and TED hose  James P. Holley Bouche M.D.

## 2016-11-15 NOTE — Progress Notes (Signed)
Patient HR in the 50's will low BP in the 90's/50's. MD aware. Have orders for Metoprolol 100mg  daily. Notified MD. MD ok to hold today's dose. Will continue to monitor BP and HR. Patient is asymptomatic .

## 2016-11-15 NOTE — Discharge Summary (Signed)
Physician Discharge Summary  Patient ID: Mary Elliott MRN: [id number] DOB/AGE: 06/08/1940 76 y.o.  Admit date: 11/14/2016 Discharge date: 11/16/2016  Admission Diagnoses:  primary osteoarthritis of left knee   Discharge Diagnoses: Patient Active Problem List   Diagnosis Date Noted  . S/P total knee arthroplasty 11/14/2016    Past Medical History:  Diagnosis Date  . Anxiety   . HOH (hard of hearing)   . Hypertension   . Hypothyroidism   . Kidney stones   . Tinnitus      Transfusion: No transfusions on this admission   Consultants (if any):   Discharged Condition: Improved  Hospital Course: Mary Elliott is an 76 y.o. female who was admitted 11/14/2016 with a diagnosis of degenerative arthrosis left knee and went to the operating room on 11/14/2016 and underwent the above named procedures.    Surgeries:Procedure(s): COMPUTER ASSISTED TOTAL KNEE ARTHROPLASTY on 11/14/2016  PRE-OPERATIVE DIAGNOSIS: Degenerative arthrosis of the left knee, primary  POST-OPERATIVE DIAGNOSIS:  Same  PROCEDURE:  Left total knee arthroplasty using computer-assisted navigation  SURGEON:  Marciano Sequin. M.D.  ASSISTANT:  Vance Peper, PA (present and scrubbed throughout the case, critical for assistance with exposure, retraction, instrumentation, and closure)  ANESTHESIA: spinal  ESTIMATED BLOOD LOSS: 50 mL  FLUIDS REPLACED: 1200 mL of crystalloid  TOURNIQUET TIME: 82 minutes  DRAINS: 2 medium Hemovac drains  SOFT TISSUE RELEASES: Anterior cruciate ligament, posterior cruciate ligament, deep medial collateral ligament, patellofemoral ligament  IMPLANTS UTILIZED: DePuy Attune size 6N posterior stabilized femoral component (cemented), size 5 rotating platform tibial component (cemented), 35 mm medialized dome patella (cemented), and a 5 mm stabilized rotating platform polyethylene insert.  INDICATIONS FOR SURGERY: Mary Elliott is a 76 y.o. year old female with a  long history of progressive knee pain. X-rays demonstrated severe degenerative changes in tricompartmental fashion. The patient had not seen any significant improvement despite conservative nonsurgical intervention. After discussion of the risks and benefits of surgical intervention, the patient expressed understanding of the risks benefits and agree with plans for total knee arthroplasty.   The risks, benefits, and alternatives were discussed at length including but not limited to the risks of infection, bleeding, nerve injury, stiffness, blood clots, the need for revision surgery, cardiopulmonary complications, among others, and they were willing to proceed. Patient tolerated the surgery well. No complications .Patient was taken to PACU where she was stabilized and then transferred to the orthopedic floor.  Patient started on Lovenox 30 mg q 12 hrs. Foot pumps applied bilaterally at 80 mm hgb. Heels elevated off bed with rolled towels. No evidence of DVT. Calves non tender. Negative Homan. Physical therapy started on day #1 for gait training and transfer with OT starting on  day #1 for ADL and assisted devices. Patient has done well with therapy. Ambulated greater than 200 feet upon being discharged.  Patient's IV And Foley were discontinued on day #1 with Hemovac being discontinued on day #2. Dressing was changed on day 2 prior to patient being discharged   She was given perioperative antibiotics:  Anti-infectives    Start     Dose/Rate Route Frequency Ordered Stop   11/14/16 1400  ceFAZolin (ANCEF) IVPB 2g/100 mL premix     2 g 200 mL/hr over 30 Minutes Intravenous Every 6 hours 11/14/16 1201 11/15/16 1359   11/14/16 0623  ceFAZolin (ANCEF) 2-4 GM/100ML-% IVPB    Comments:  Sylvester Harder   : cabinet override      11/14/16 5003 11/14/16 0741  11/14/16 0600  ceFAZolin (ANCEF) IVPB 2g/100 mL premix     2 g 200 mL/hr over 30 Minutes Intravenous On call to O.R. 11/13/16 2140 11/14/16 0747     .  She was fitted with AV 1 compression foot pump devices, instructed on heel pumps, early ambulation, and fitted with TED stockings bilaterally for DVT prophylaxis.  She benefited maximally from the hospital stay and there were no complications.    Recent vital signs:  Vitals:   11/14/16 2033 11/14/16 2340  BP: (!) 92/51 (!) 92/52  Pulse: (!) 48 (!) 54  Resp:  18  Temp:  97.8 F (36.6 C)    Recent laboratory studies:  Lab Results  Component Value Date   HGB 11.7 (L) 11/15/2016   HGB 13.3 11/02/2016   Lab Results  Component Value Date   WBC 12.6 (H) 11/15/2016   PLT 241 11/15/2016   Lab Results  Component Value Date   INR 1.01 11/02/2016   Lab Results  Component Value Date   NA 137 11/15/2016   K 3.9 11/15/2016   CL 109 11/15/2016   CO2 24 11/15/2016   BUN 14 11/15/2016   CREATININE 0.83 11/15/2016   GLUCOSE 134 (H) 11/15/2016    Discharge Medications:   Allergies as of 11/16/2016      Reactions   Nitrofurantoin Hives   Vesicare [solifenacin Succinate] Other (See Comments)   headache   Duloxetine Other (See Comments)   Unable to empty bladder   Nsaids Other (See Comments)   Prior HX of ulcers   Oxybutynin Hives, Rash   The patches cause a reaction.   Solifenacin Other (See Comments)   Sulfa Antibiotics Other (See Comments), Diarrhea   GI UPSET Felt cloudy and couldn't move   Tegaserod Other (See Comments)      Medication List    TAKE these medications   ALIGN 4 MG Caps Take 4 mg by mouth daily.   cyanocobalamin 1000 MCG tablet Take 1,000 mcg by mouth daily.   enoxaparin 30 MG/0.3ML injection Commonly known as:  LOVENOX Inject 0.4 mLs (40 mg total) into the skin daily.   estradiol 0.5 MG tablet Commonly known as:  ESTRACE Take 0.25 tablets by mouth daily.   Glucosamine-Chondroitin 500-400 MG Caps Take 2 capsules by mouth daily.   levothyroxine 100 MCG tablet Commonly known as:  SYNTHROID, LEVOTHROID Take 100 mcg by mouth daily before  breakfast.   metoprolol succinate 100 MG 24 hr tablet Commonly known as:  TOPROL-XL Take 100 mg by mouth daily.   oxyCODONE 5 MG immediate release tablet Commonly known as:  Oxy IR/ROXICODONE Take 1-2 tablets (5-10 mg total) by mouth every 4 (four) hours as needed for severe pain or breakthrough pain.   sertraline 50 MG tablet Commonly known as:  ZOLOFT Take 75 mg by mouth daily. Takes 1.5 tablet   traMADol 50 MG tablet Commonly known as:  ULTRAM Take 1-2 tablets (50-100 mg total) by mouth every 4 (four) hours as needed for moderate pain.   Vitamin D3 2000 units capsule Take 2,000 Units by mouth daily.            Durable Medical Equipment        Start     Ordered   11/14/16 1202  DME Walker rolling  Once    Question:  Patient needs a walker to treat with the following condition  Answer:  Total knee replacement status   11/14/16 1201   11/14/16 1202  DME Bedside  commode  Once    Question:  Patient needs a bedside commode to treat with the following condition  Answer:  Total knee replacement status   11/14/16 1201      Diagnostic Studies: Dg Knee Left Port  Result Date: 11/14/2016 CLINICAL DATA:  Left total knee replacement. EXAM: PORTABLE LEFT KNEE - 1-2 VIEW COMPARISON:  None. FINDINGS: Left total knee arthroplasty. Surgical drains and skin staples are in place. Associated joint air and fluid. IMPRESSION: Left total knee arthroplasty with expected postoperative findings Electronically Signed   By: Lorin Picket M.D.   On: 11/14/2016 11:32    Disposition: Final discharge disposition not confirmed    Follow-up Information    Watt Climes, PA On 11/29/2016.   Specialty:  Physician Assistant Why:  at 1:15pm Contact information: [place] [address] [phone]        Dereck Leep, MD On 12/27/2016.   Specialty:  Orthopedic Surgery Why:  at 10:00am Contact information: [place] [address] [phone]            Signed: Watt Climes 11/15/2016, 7:41 AM

## 2016-11-15 NOTE — Discharge Instructions (Signed)

## 2016-11-15 NOTE — Evaluation (Signed)
Occupational Therapy Evaluation Patient Details Name: Mary Elliott MRN: [id number] DOB: Sep 11, 1940 Today's Date: 11/15/2016    History of Present Illness Pt. is a 76 y.o. female who was admitted to [facility] for a Left TKR. Pt. PMHx includes: HTN, Anxiety, and Hypothyroidism, kidney stones, and tinnitus.    Clinical Impression   Pt. Presents with weakness, limited mobility, and 4/10 pain. Pt. Could benefit from skilled OT services for ADL training, A/E training, UE there. Ex, and pt. education about home modification, work simplification, and DME at this level of care. 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 Pt. is generally familiar with the A/E for LE ADLs. Pt. Resides with her husband, and has a friend coming from out of town to stay with her for the first few weeks upon returning home. No follow-up OT services are warranted at this time upon discharge.     Follow Up Recommendations    No follow-up OT services   Equipment Recommendations       Recommendations for Other Services       Precautions / Restrictions Precautions Precautions: Knee Precaution Booklet Issued: Yes (comment) Required Braces or Orthoses: Knee Immobilizer - Left Knee Immobilizer - Left: Discontinue once straight leg raise with < 10 degree lag Restrictions Weight Bearing Restrictions: Yes LLE Weight Bearing: Weight bearing as tolerated                                ADL either performed or assessed with clinical judgement   ADL Overall ADL's : Needs assistance/impaired Eating/Feeding: Set up   Grooming: Set up   Upper Body Bathing: Set up   Lower Body Bathing: Minimal assistance   Upper Body Dressing : Set up   Lower Body Dressing: Minimal assistance               Functional mobility during ADLs: Min guard General ADL Comments: Pt. education was provided about A/E use for LE ADLs, home set-up, daily routines.     Vision         Perception     Praxis       Pertinent Vitals/Pain Pain Assessment: 4/10 pain     Hand Dominance Right   Extremity/Trunk Assessment Upper Extremity Assessment Upper Extremity Assessment: Overall WFL for tasks assessed           Communication Communication Communication: No difficulties   Cognition Arousal/Alertness: Awake/alert Behavior During Therapy: WFL for tasks assessed/performed Overall Cognitive Status: Within Functional Limits for tasks assessed                                     General Comments       Exercises   Shoulder Instructions     Home Living Family/patient expects to be discharged to:: Private residence Living Arrangements: Spouse/significant other Available Help at Discharge: Family Type of Home: House Home Access: Stairs to enter Technical brewer of Steps: 3 Entrance Stairs-Rails: Right;Left Home Layout: One level     Bathroom Shower/Tub: Walk-in shower;Door         Home Equipment: Walker - 2 wheels;Cane - single point;Bedside commode;Shower seat - built in;Grab bars - tub/shower          Prior Functioning/Environment Level of Independence: Independent        Comments: Independent with ADLs/IADLs, Active, Driving, and manging own medication.  OT Problem List: Decreased strength;Decreased activity tolerance;Decreased knowledge of use of DME or AE;Pain;Decreased range of motion      OT Treatment/Interventions: Self-care/ADL training;Therapeutic exercise;Patient/family education;Therapeutic activities;Energy conservation;DME and/or AE instruction    OT Goals(Current goals can be found in the care plan section) Acute Rehab OT Goals Patient Stated Goal: Return to PLOF OT Goal Formulation: With patient Potential to Achieve Goals: Good  OT Frequency: Min 2X/week   Barriers to D/C:            Co-evaluation              AM-PAC PT "6 Clicks" Daily Activity     Outcome Measure Help from another person eating meals?:  None Help from another person taking care of personal grooming?: None Help from another person toileting, which includes using toliet, bedpan, or urinal?: A Little Help from another person bathing (including washing, rinsing, drying)?: A Little Help from another person to put on and taking off regular upper body clothing?: None Help from another person to put on and taking off regular lower body clothing?: A Little 6 Click Score: 21   End of Session    Activity Tolerance: Patient tolerated treatment well Patient left: with call bell/phone within reach;in chair;with chair alarm set  OT Visit Diagnosis: Muscle weakness (generalized) (M62.81)                Time: 0932-1000 OT Time Calculation (min): 28 min Charges:  OT General Charges $OT Visit: 1 Procedure OT Evaluation $OT Eval Moderate Complexity: 1 Procedure G-Codes:     Harrel Carina, MS, OTR/L   Harrel Carina, MS, OTR/L 11/15/2016, 11:07 AM

## 2016-11-15 NOTE — Progress Notes (Signed)
Physical Therapy Treatment Patient Details Name: Mary Elliott MRN: [id number] DOB: 02/28/1941 Today's Date: 11/15/2016    History of Present Illness Pt. is a 75 y.o. female who was admitted to [facility] for a Left TKR. Pt. PMHx includes: HTN, Anxiety, and Hypothyroidism, kidney stones, and tinnitus.     PT Comments    Pt continues to demonstrate excellent progress with therapy during PM session. She is able to complete a full lap around RN station as well as stair training. Pt is safe and steady ascending/descending 4 steps. Pt able to complete seated exercises at EOB with improving L knee extension strength. Pt has met all PT barriers to discharge and is appropriate to return home with husband and HH PT when medically stable. Will continue to progress ambulation distance and repeat stair training with unilateral railing during AM session tomorrow. Pt will benefit from PT services to address deficits in strength, balance, and mobility in order to return to full function at home.      Follow Up Recommendations  Home health PT     Equipment Recommendations  None recommended by PT;Other (comment) (Use rolling walker at discharge)    Recommendations for Other Services       Precautions / Restrictions Precautions Precautions: Knee Precaution Booklet Issued: Yes (comment) Required Braces or Orthoses: Knee Immobilizer - Left Knee Immobilizer - Left: Discontinue once straight leg raise with < 10 degree lag Restrictions Weight Bearing Restrictions: Yes LLE Weight Bearing: Weight bearing as tolerated    Mobility  Bed Mobility Overal bed mobility: Needs Assistance Bed Mobility: Sit to Supine;Supine to Sit     Supine to sit: Supervision Sit to supine: Supervision   General bed mobility comments: Good sequencing and speed with bed mobility. Minimal use of bed rails and HOB minimally elevated. Educated about using RLE to assist LLE when returning to bed  Transfers Overall transfer  level: Needs assistance Equipment used: Rolling walker (2 wheeled) Transfers: Sit to/from Stand Sit to Stand: Min guard         General transfer comment: Continues to demonstrate improving sequencing with transfers. Practiced toilet transfers again and pt demonstrates safe hand placement and good stability  Ambulation/Gait Ambulation/Gait assistance: Min guard Ambulation Distance (Feet): 250 Feet Assistive device: Rolling walker (2 wheeled) Gait Pattern/deviations: Decreased step length - right;Decreased step length - left;Decreased weight shift to left;Decreased stance time - left Gait velocity: Decreased Gait velocity interpretation: <1.8 ft/sec, indicative of risk for recurrent falls General Gait Details: Pt able to ambulate to rehab gym and back to room completing a full lap around RN station. Cues for upright posture and to progress to step-through pattern. Cues to decrease UE support during ambulation and take more weight through LLE. Denies DOE however pt does take a seated rest break halfway through ambulation.    Stairs Stairs: Yes   Stair Management: Two rails;Step to pattern Number of Stairs: 4 General stair comments: Pt educated about proper sequencing with stairs. She performs slowly but is able to complete with good safety and stability. Educated pt about how to perform with unilateral railing as well  Wheelchair Mobility    Modified [name] (Stroke Patients Only)       Balance Overall balance assessment: Needs assistance Sitting-balance support: No upper extremity supported Sitting balance-Leahy Scale: Good     Standing balance support: No upper extremity supported Standing balance-Leahy Scale: Fair Standing balance comment: Able to maintain balance without UE support in standing                              Cognition Arousal/Alertness: Awake/alert Behavior During Therapy: WFL for tasks assessed/performed Overall Cognitive Status: Within  Functional Limits for tasks assessed                                        Exercises Total Joint Exercises Ankle Circles/Pumps: Strengthening;Both;15 reps;Seated (Heel raises in sitting) Hip ABduction/ADduction: Strengthening;Left;15 reps;Seated Long Arc Quad: Strengthening;Left;15 reps;Seated Knee Flexion: Strengthening;Left;15 reps;Seated Marching in Standing: Strengthening;Both;15 reps;Seated    General Comments        Pertinent Vitals/Pain Pain Assessment: 0-10 Pain Score: 2  Pain Location: Left knee Pain Descriptors / Indicators: Aching Pain Intervention(s): Monitored during session    Home Living                      Prior Function            PT Goals (current goals can now be found in the care plan section) Acute Rehab PT Goals Patient Stated Goal: Return to PLOF PT Goal Formulation: With patient/family Time For Goal Achievement: 11/28/16 Potential to Achieve Goals: Good Progress towards PT goals: Progressing toward goals    Frequency    BID      PT Plan Current plan remains appropriate    Co-evaluation              AM-PAC PT "6 Clicks" Daily Activity  Outcome Measure  Difficulty turning over in bed (including adjusting bedclothes, sheets and blankets)?: A Little Difficulty moving from lying on back to sitting on the side of the bed? : A Little Difficulty sitting down on and standing up from a chair with arms (e.g., wheelchair, bedside commode, etc,.)?: A Little Help needed moving to and from a bed to chair (including a wheelchair)?: A Little Help needed walking in hospital room?: A Little Help needed climbing 3-5 steps with a railing? : A Little 6 Click Score: 18    End of Session Equipment Utilized During Treatment: Gait belt Activity Tolerance: Patient tolerated treatment well Patient left: with call bell/phone within reach;with family/visitor present;in bed;with nursing/sitter in room;with SCD's reapplied (bone  foam and polar care in place) Nurse Communication: Other (comment) (Pt urinated in bathroom, ready for Lovenox education) PT Visit Diagnosis: Unsteadiness on feet (R26.81);Muscle weakness (generalized) (M62.81)     Time: 1406-1452 PT Time Calculation (min) (ACUTE ONLY): 46 min  Charges:  $Gait Training: 8-22 mins $Therapeutic Exercise: 8-22 mins $Therapeutic Activity: 8-22 mins                    G Codes:       Jason D Huprich PT, DPT     Huprich,Jason 11/15/2016, 3:20 PM   

## 2016-11-15 NOTE — Progress Notes (Signed)
Physical Therapy Treatment Patient Details Name: Mary Elliott MRN: [id number] DOB: March 19, 1941 Today's Date: 11/15/2016    History of Present Illness Pt underwent L TKR without reported post-op complications. She is POD#0 at time of initial evaluation. PMH: HTN, anxiety, and hypothyroidism    PT Comments    Pt demonstrates excellent progress with therapist on this date. She completes bed exercises with good SLR and SAQ strength in LLE. She is safely able to transfer with therapist from bed, recliner, and commode. Orthostatic vitals negative and pt is asymptomatic during transfers. She is able to ambulate from bed to bathroom, RN station, and back to recliner with therapist. Pt able to progress to step-through pattern by the end of ambulation with cues. VSS throughout ambulation and pt denies DOE. If possible will attempt to complete a lap around RN station this afternoon. Pt will benefit from PT services to address deficits in strength, balance, and mobility in order to return to full function at home.    Follow Up Recommendations  Home health PT     Equipment Recommendations  None recommended by PT;Other (comment) (Use rolling walker at discharge)    Recommendations for Other Services       Precautions / Restrictions Precautions Precautions: Knee Precaution Booklet Issued: Yes (comment) Required Braces or Orthoses: Knee Immobilizer - Left Knee Immobilizer - Left: Discontinue once straight leg raise with < 10 degree lag Restrictions Weight Bearing Restrictions: Yes LLE Weight Bearing: Weight bearing as tolerated    Mobility  Bed Mobility Overal bed mobility: Needs Assistance Bed Mobility: Supine to Sit     Supine to sit: Supervision     General bed mobility comments: Good sequencing and speed with bed mobility. Minimal use of bed rails and HOB minimally elevated  Transfers Overall transfer level: Needs assistance Equipment used: Rolling walker (2 wheeled) Transfers:  Sit to/from Stand Sit to Stand: Min guard         General transfer comment: Performed sit to stand transfers with patient from bed, commode, and recliner. Pt demonstrates safe hand placement on this date and improving weight shifting to LLE. Cues to back up to recliner and commode prior to sitting. Orthostatic vitals obtained when first getting out of bed and are negative and pt is asymmptomatic  Ambulation/Gait Ambulation/Gait assistance: Min guard Ambulation Distance (Feet): 120 Feet Assistive device: Rolling walker (2 wheeled) Gait Pattern/deviations: Decreased step length - right;Decreased step length - left;Decreased weight shift to left;Decreased stance time - left Gait velocity: Decreased Gait velocity interpretation: <1.8 ft/sec, indicative of risk for recurrent falls General Gait Details: Pt is able to ambulate from bed to bathroom, RN station, and back to recliner. Education regarding sequencing for step-to pattern initially, progressing to step-through pattern. VSS throughout ambulation and pt denies DOE   Financial trader [name] (Stroke Patients Only)       Balance Overall balance assessment: Needs assistance Sitting-balance support: No upper extremity supported Sitting balance-Leahy Scale: Good     Standing balance support: No upper extremity supported Standing balance-Leahy Scale: Fair Standing balance comment: Able to maintain balance without UE support in standing                            Cognition Arousal/Alertness: Awake/alert Behavior During Therapy: WFL for tasks assessed/performed Overall Cognitive Status: Within Functional Limits for tasks assessed  Exercises Total Joint Exercises Ankle Circles/Pumps: AROM;Both;10 reps;Supine Quad Sets: Strengthening;Both;10 reps;Supine Gluteal Sets: Strengthening;Both;10 reps;Supine Towel Squeeze:  Strengthening;Both;10 reps;Supine Short Arc Quad: Strengthening;Left;10 reps;Supine Heel Slides: Strengthening;10 reps;Supine;Left Hip ABduction/ADduction: Strengthening;Left;10 reps;Supine Straight Leg Raises: Strengthening;Left;10 reps;Supine Goniometric ROM: -2 to 92 degrees AAROM, limited by bulky dressings    General Comments        Pertinent Vitals/Pain Pain Assessment: No/denies pain (Reports minimal "soreness" during ambulation)    Home Living                      Prior Function            PT Goals (current goals can now be found in the care plan section) Acute Rehab PT Goals Patient Stated Goal: Return to prior function with less pain PT Goal Formulation: With patient/family Time For Goal Achievement: 11/28/16 Potential to Achieve Goals: Good Progress towards PT goals: Progressing toward goals    Frequency    BID      PT Plan Current plan remains appropriate    Co-evaluation              AM-PAC PT "6 Clicks" Daily Activity  Outcome Measure  Difficulty turning over in bed (including adjusting bedclothes, sheets and blankets)?: A Little Difficulty moving from lying on back to sitting on the side of the bed? : A Little Difficulty sitting down on and standing up from a chair with arms (e.g., wheelchair, bedside commode, etc,.)?: A Little Help needed moving to and from a bed to chair (including a wheelchair)?: A Little Help needed walking in hospital room?: A Little Help needed climbing 3-5 steps with a railing? : A Lot 6 Click Score: 17    End of Session Equipment Utilized During Treatment: Gait belt Activity Tolerance: Patient tolerated treatment well Patient left: in chair;with call bell/phone within reach;with chair alarm set;Other (comment);with family/visitor present (starting OT session)   PT Visit Diagnosis: Unsteadiness on feet (R26.81);Muscle weakness (generalized) (M62.81)     Time: 3329-5188 PT Time Calculation (min) (ACUTE  ONLY): 44 min  Charges:  $Gait Training: 8-22 mins $Therapeutic Exercise: 8-22 mins $Therapeutic Activity: 8-22 mins                    G Codes:       Lyndel Safe Courney Garrod PT, DPT     Keah Lamba 11/15/2016, 10:36 AM

## 2016-11-16 LAB — CBC
HCT: 33.9 % — ABNORMAL LOW (ref 35.0–47.0)
Hemoglobin: 11.6 g/dL — ABNORMAL LOW (ref 12.0–16.0)
MCH: 31.2 pg (ref 26.0–34.0)
MCHC: 34.2 g/dL (ref 32.0–36.0)
MCV: 91.3 fL (ref 80.0–100.0)
Platelets: 253 K/uL (ref 150–440)
RBC: 3.72 MIL/uL — ABNORMAL LOW (ref 3.80–5.20)
RDW: 14.1 % (ref 11.5–14.5)
WBC: 9.1 K/uL (ref 3.6–11.0)

## 2016-11-16 LAB — BASIC METABOLIC PANEL
ANION GAP: 6 (ref 5–15)
BUN: 13 mg/dL (ref 6–20)
CHLORIDE: 105 mmol/L (ref 101–111)
CO2: 26 mmol/L (ref 22–32)
Calcium: 8.3 mg/dL — ABNORMAL LOW (ref 8.9–10.3)
Creatinine, Ser: 0.66 mg/dL (ref 0.44–1.00)
GFR calc Af Amer: >60 mL/min (ref >60)
Glucose, Bld: 89 mg/dL (ref 65–99)
POTASSIUM: 3.3 mmol/L — AB (ref 3.5–5.1)
SODIUM: 137 mmol/L (ref 135–145)

## 2016-11-16 MED ORDER — ENOXAPARIN SODIUM 30 MG/0.3ML ~~LOC~~ SOLN: 40.0000 mg | SUBCUTANEOUS | 0 refills | Status: DC

## 2016-11-16 MED ORDER — OXYCODONE HCL 5 MG PO TABS: 5.0000 mg | ORAL_TABLET | ORAL | 0 refills | Status: DC | PRN

## 2016-11-16 MED ORDER — LACTULOSE 10 GM/15ML PO SOLN
10.0000 g | Freq: Two times a day (BID) | ORAL | Status: DC | PRN
  Administered 2016-11-16: 10 g via ORAL
  Filled 2016-11-16: qty 30

## 2016-11-16 MED ORDER — TRAMADOL HCL 50 MG PO TABS: 50.0000 mg | ORAL_TABLET | ORAL | 0 refills | Status: DC | PRN

## 2016-11-16 NOTE — Progress Notes (Signed)
Physical Therapy Treatment Patient Details Name: Mary Elliott MRN: [id number] DOB: 09/13/1940 Today's Date: 11/16/2016    History of Present Illness Pt. is a 76 y.o. female who was admitted to [facility] for a Left TKR. Pt. PMHx includes: HTN, Anxiety, and Hypothyroidism, kidney stones, and tinnitus.     PT Comments    Pt is able to complete a lap around RN station and stairs again with therapist on this date. She is able to complete stairs with L unilateral hand rail simulating the environment she has at home. Able to perform all seated and supine exercises as instructed. Notable LLE quad lag with LAQ in sitting. AAROM extension improved to 0 degrees and flexion remains at 92 degrees and is limited by pain on this date. Pt has met all PT barriers for discharge and is safe to return home with husband and [facility] PT when medically appropriate. Pt will benefit from PT services to address deficits in strength, balance, and mobility in order to return to full function at home.    Follow Up Recommendations  Home health PT     Equipment Recommendations  None recommended by PT;Other (comment) (Use rolling walker at discharge)    Recommendations for Other Services       Precautions / Restrictions Precautions Precautions: Knee Precaution Booklet Issued: Yes (comment) Required Braces or Orthoses: Knee Immobilizer - Left Knee Immobilizer - Left: Discontinue once straight leg raise with < 10 degree lag Restrictions Weight Bearing Restrictions: Yes LLE Weight Bearing: Weight bearing as tolerated    Mobility  Bed Mobility Overal bed mobility: Needs Assistance Bed Mobility: Supine to Sit     Supine to sit: Supervision     General bed mobility comments: Good sequencing and speed with bed mobility. Minimal use of bed rails and HOB minimally elevated.  Transfers Overall transfer level: Needs assistance Equipment used: Rolling walker (2 wheeled) Transfers: Sit to/from Stand Sit to Stand: Min  guard         General transfer comment: Continues to demonstrate improving sequencing with transfers. Improved weight shifting to LLE. Practiced toilet transfers again with patient and she demonstrates safe hand placement and good stability. Educated patient and husband about car transfers  Ambulation/Gait Ambulation/Gait assistance: Min Herbalist (Feet): 250 Feet Assistive device: Rolling walker (2 wheeled) Gait Pattern/deviations: Decreased step length - right;Decreased step length - left;Decreased weight shift to left;Decreased stance time - left Gait velocity: Decreased Gait velocity interpretation: <1.8 ft/sec, indicative of risk for recurrent falls General Gait Details: Pt once again is able to ambulate to the rehab gym and back to the room completing a full lap around RN station. Sized patient's walker for her prior to ambulation. Cues to improve L heel strike and step length. Pt demonstrate improved gait speed today with ambualtion. Denies DOE. Decreasing UE reliance on walker   Stairs Stairs: Yes   Stair Management: Step to pattern;One rail Left Number of Stairs: 4 General stair comments: Practiced ascend/descend stairs with L railing only and step-to pattern. Education about sequencing provided. Pt demonstrates good strength/stability with stairs. No safety concerns evident  Wheelchair Mobility    Modified [name] (Stroke Patients Only)       Balance Overall balance assessment: Needs assistance Sitting-balance support: No upper extremity supported Sitting balance-Leahy Scale: Good     Standing balance support: No upper extremity supported Standing balance-Leahy Scale: Fair Standing balance comment: Able to maintain balance without UE support in standing  Cognition Arousal/Alertness: Awake/alert Behavior During Therapy: WFL for tasks assessed/performed Overall Cognitive Status: Within Functional Limits for tasks  assessed                                        Exercises Total Joint Exercises Ankle Circles/Pumps: Both;AROM;10 reps;Supine (Heel raises in sitting as well x 10 bilateral) Quad Sets: Strengthening;Both;10 reps;Supine Gluteal Sets: Strengthening;Both;10 reps;Supine Hip ABduction/ADduction: Strengthening;Seated;Both;10 reps Straight Leg Raises: Strengthening;Left;10 reps;Supine Long Arc Quad: Strengthening;Left;Seated;10 reps Knee Flexion: Strengthening;Left;Seated;10 reps Goniometric ROM: 0 to 92 degrees AAROM, pain limited for flexion Marching in Standing: Strengthening;Both;Seated;10 reps    General Comments        Pertinent Vitals/Pain Pain Assessment: Faces Faces Pain Scale: Hurts a little bit Pain Location: Left knee Pain Intervention(s): Monitored during session    Home Living                      Prior Function            PT Goals (current goals can now be found in the care plan section) Acute Rehab PT Goals Patient Stated Goal: Return to PLOF PT Goal Formulation: With patient/family Time For Goal Achievement: 11/28/16 Potential to Achieve Goals: Good Progress towards PT goals: Progressing toward goals    Frequency    BID      PT Plan Current plan remains appropriate    Co-evaluation              AM-PAC PT "6 Clicks" Daily Activity  Outcome Measure  Difficulty turning over in bed (including adjusting bedclothes, sheets and blankets)?: A Little Difficulty moving from lying on back to sitting on the side of the bed? : A Little Difficulty sitting down on and standing up from a chair with arms (e.g., wheelchair, bedside commode, etc,.)?: A Little Help needed moving to and from a bed to chair (including a wheelchair)?: A Little Help needed walking in hospital room?: A Little Help needed climbing 3-5 steps with a railing? : A Little 6 Click Score: 18    End of Session Equipment Utilized During Treatment: Gait  belt Activity Tolerance: Patient tolerated treatment well Patient left: with call bell/phone within reach;with family/visitor present;with SCD's reapplied;in chair (towel roll and polar care in place) Nurse Communication: Other (comment) (Completed PT goals) PT Visit Diagnosis: Unsteadiness on feet (R26.81);Muscle weakness (generalized) (M62.81)     Time: 6962-9528 PT Time Calculation (min) (ACUTE ONLY): 36 min  Charges:  $Gait Training: 8-22 mins $Therapeutic Exercise: 8-22 mins                    G Codes:       Lyndel Safe Amoreena Neubert PT, DPT     Harden Bramer 11/16/2016, 10:54 AM

## 2016-11-16 NOTE — Progress Notes (Signed)
OT Cancellation Note  Patient Details Name: Mary Elliott MRN: [id number] DOB: [birth year]   Cancelled Treatment:    Reason Eval/Treat Not Completed: Other (comment). Discharge in process, unable to treat.    Jeni Salles, MPH, MS, OTR/L ascom [phone] 11/16/16, 3:39 PM

## 2016-11-16 NOTE — Progress Notes (Signed)
   Subjective: 2 Days Post-Op Procedure(s) (LRB): COMPUTER ASSISTED TOTAL KNEE ARTHROPLASTY (Left) Patient reports pain as mild.   Patient is well, and has had no acute complaints or problems Patient did very well with therapy yesterday. Completed the lap around the nurse's desk and did steps. Plan is to go Home after hospital stay. no nausea and no vomiting Patient denies any chest pains or shortness of breath. Objective: Vital signs in last 24 hours: Temp:  [97.7 F (36.5 C)-98.1 F (36.7 C)] 98.1 F (36.7 C) (07/10 1533) Pulse Rate:  [49-55] 55 (07/10 1533) Resp:  [16-18] 16 (07/10 1533) BP: (98-123)/(50-73) 98/50 (07/10 1533) SpO2:  [97 %-100 %] 100 % (07/10 1533) well approximated incision Heels are non tender and elevated off the bed using rolled towels Intake/Output from previous day: 07/10 0701 - 07/11 0700 In: 360 [P.O.:360] Out: 830 [Urine:700; Drains:130] Intake/Output this shift: No intake/output data recorded.   Recent Labs  11/15/16 0347 11/16/16 0437  HGB 11.7* 11.6*    Recent Labs  11/15/16 0347 11/16/16 0437  WBC 12.6* 9.1  RBC 3.68* 3.72*  HCT 34.3* 33.9*  PLT 241 253    Recent Labs  11/15/16 0347 11/16/16 0437  NA 137 137  K 3.9 3.3*  CL 109 105  CO2 24 26  BUN 14 13  CREATININE 0.83 0.66  GLUCOSE 134* 89  CALCIUM 8.5* 8.3*   No results for input(s): LABPT, INR in the last 72 hours.  EXAM General - Patient is Alert, Appropriate and Oriented Extremity - Neurologically intact Neurovascular intact Sensation intact distally Intact pulses distally Dorsiflexion/Plantar flexion intact No cellulitis present Compartment soft Dressing - dressing C/D/I Motor Function - intact, moving foot and toes well on exam.    Past Medical History:  Diagnosis Date  . Anxiety   . HOH (hard of hearing)   . Hypertension   . Hypothyroidism   . Kidney stones   . Tinnitus     Assessment/Plan: 2 Days Post-Op Procedure(s) (LRB): COMPUTER  ASSISTED TOTAL KNEE ARTHROPLASTY (Left) Active Problems:   S/P total knee arthroplasty  Estimated body mass index is 29.81 kg/m as calculated from the following:   Height as of this encounter: 5\' 2"  (1.575 m).   Weight as of this encounter: 73.9 kg (163 lb). Up with therapy Discharge home with home health  Labs: Were reviewed. DVT Prophylaxis - Lovenox, Foot Pumps and TED hose Weight-Bearing as tolerated to left leg Hemovac was discontinued on today's visit Patient needs to have a bowel movement prior to being discharged Please wash the operative leg and apply TED stockings to both legs. Please change dressing prior to patient being discharged. Patient may be discharged once she has a bowel movement.  Jillyn Ledger. [place] 11/16/2016, 7:14 AM

## 2016-11-16 NOTE — Care Management Note (Signed)
Case Management Note  Patient Details  Name: Kinsleigh Ludolph MRN: [id number] Date of Birth: 09-02-1940  Subjective/Objective:  Discharging today                  Action/Plan: [facility] notifi of discharge. No DME needs. Cost of Lovenox is $ 158.62. Patient updated and is denies issues paying for medication. She is in agreement with POC.   Expected Discharge Date:  11/16/16               Expected Discharge Plan:  [place]  In-House Referral:     Discharge planning Services  CM Consult  Post Acute Care Choice:  Home Health Choice offered to:  Patient  DME Arranged:    DME Agency:     HH Arranged:  PT [place]:  [facility] (formerly [employer])  Status of Service:  Completed, signed off  If discussed at [employer], dates discussed:    Additional Comments:  Jolly Mango, RN 11/16/2016, 8:12 AM

## 2016-11-16 NOTE — Care Management Important Message (Signed)
Important Message  Patient Details  Name: Mary Elliott MRN: [id number] Date of Birth: Jan 30, 1941   Medicare Important Message Given:  N/A - LOS <3 / Initial given by admissions    Jolly Mango, RN 11/16/2016, 8:18 AM

## 2016-11-16 NOTE — Progress Notes (Signed)
Patient is being discharged home with husband, which was in room during instructions. Reviewed scripts, last dose of meds given, dressing care (was changed before discharge), and activity. Instructed on cryo cuff. IV removed with cath intact. Allowed time for questions.

## 2016-11-17 DIAGNOSIS — Z9181 History of falling: Secondary | ICD-10-CM | POA: Diagnosis not present

## 2016-11-17 DIAGNOSIS — E039 Hypothyroidism, unspecified: Secondary | ICD-10-CM | POA: Diagnosis not present

## 2016-11-17 DIAGNOSIS — I10 Essential (primary) hypertension: Secondary | ICD-10-CM | POA: Diagnosis not present

## 2016-11-17 DIAGNOSIS — Z471 Aftercare following joint replacement surgery: Secondary | ICD-10-CM | POA: Diagnosis not present

## 2016-11-17 DIAGNOSIS — F419 Anxiety disorder, unspecified: Secondary | ICD-10-CM | POA: Diagnosis not present

## 2016-11-17 DIAGNOSIS — N200 Calculus of kidney: Secondary | ICD-10-CM | POA: Diagnosis not present

## 2016-11-17 DIAGNOSIS — H9190 Unspecified hearing loss, unspecified ear: Secondary | ICD-10-CM | POA: Diagnosis not present

## 2016-11-17 DIAGNOSIS — Z96652 Presence of left artificial knee joint: Secondary | ICD-10-CM | POA: Diagnosis not present

## 2016-11-21 DIAGNOSIS — Z471 Aftercare following joint replacement surgery: Secondary | ICD-10-CM | POA: Diagnosis not present

## 2016-11-21 DIAGNOSIS — Z96652 Presence of left artificial knee joint: Secondary | ICD-10-CM | POA: Diagnosis not present

## 2016-11-21 DIAGNOSIS — F419 Anxiety disorder, unspecified: Secondary | ICD-10-CM | POA: Diagnosis not present

## 2016-11-21 DIAGNOSIS — E039 Hypothyroidism, unspecified: Secondary | ICD-10-CM | POA: Diagnosis not present

## 2016-11-21 DIAGNOSIS — N200 Calculus of kidney: Secondary | ICD-10-CM | POA: Diagnosis not present

## 2016-11-21 DIAGNOSIS — I10 Essential (primary) hypertension: Secondary | ICD-10-CM | POA: Diagnosis not present

## 2016-11-21 DIAGNOSIS — Z9181 History of falling: Secondary | ICD-10-CM | POA: Diagnosis not present

## 2016-11-21 DIAGNOSIS — H9190 Unspecified hearing loss, unspecified ear: Secondary | ICD-10-CM | POA: Diagnosis not present

## 2016-11-28 DIAGNOSIS — I10 Essential (primary) hypertension: Secondary | ICD-10-CM | POA: Diagnosis not present

## 2016-11-28 DIAGNOSIS — Z471 Aftercare following joint replacement surgery: Secondary | ICD-10-CM | POA: Diagnosis not present

## 2016-11-28 DIAGNOSIS — F419 Anxiety disorder, unspecified: Secondary | ICD-10-CM | POA: Diagnosis not present

## 2016-11-28 DIAGNOSIS — N200 Calculus of kidney: Secondary | ICD-10-CM | POA: Diagnosis not present

## 2016-11-28 DIAGNOSIS — H9190 Unspecified hearing loss, unspecified ear: Secondary | ICD-10-CM | POA: Diagnosis not present

## 2016-11-28 DIAGNOSIS — E039 Hypothyroidism, unspecified: Secondary | ICD-10-CM | POA: Diagnosis not present

## 2016-11-28 DIAGNOSIS — Z96652 Presence of left artificial knee joint: Secondary | ICD-10-CM | POA: Diagnosis not present

## 2016-11-28 DIAGNOSIS — Z9181 History of falling: Secondary | ICD-10-CM | POA: Diagnosis not present

## 2016-11-29 DIAGNOSIS — M25562 Pain in left knee: Secondary | ICD-10-CM | POA: Diagnosis not present

## 2016-11-29 DIAGNOSIS — R29898 Other symptoms and signs involving the musculoskeletal system: Secondary | ICD-10-CM | POA: Diagnosis not present

## 2016-11-29 DIAGNOSIS — Z96652 Presence of left artificial knee joint: Secondary | ICD-10-CM | POA: Diagnosis not present

## 2016-11-29 DIAGNOSIS — M25662 Stiffness of left knee, not elsewhere classified: Secondary | ICD-10-CM | POA: Diagnosis not present

## 2016-12-01 DIAGNOSIS — Z96652 Presence of left artificial knee joint: Secondary | ICD-10-CM | POA: Diagnosis not present

## 2016-12-01 DIAGNOSIS — M25562 Pain in left knee: Secondary | ICD-10-CM | POA: Diagnosis not present

## 2016-12-01 DIAGNOSIS — M25662 Stiffness of left knee, not elsewhere classified: Secondary | ICD-10-CM | POA: Diagnosis not present

## 2016-12-05 DIAGNOSIS — Z96652 Presence of left artificial knee joint: Secondary | ICD-10-CM | POA: Diagnosis not present

## 2016-12-07 DIAGNOSIS — M25562 Pain in left knee: Secondary | ICD-10-CM | POA: Diagnosis not present

## 2016-12-07 DIAGNOSIS — M25662 Stiffness of left knee, not elsewhere classified: Secondary | ICD-10-CM | POA: Diagnosis not present

## 2016-12-07 DIAGNOSIS — Z96652 Presence of left artificial knee joint: Secondary | ICD-10-CM | POA: Diagnosis not present

## 2016-12-09 DIAGNOSIS — Z96652 Presence of left artificial knee joint: Secondary | ICD-10-CM | POA: Diagnosis not present

## 2016-12-09 DIAGNOSIS — M25562 Pain in left knee: Secondary | ICD-10-CM | POA: Diagnosis not present

## 2016-12-12 DIAGNOSIS — M25562 Pain in left knee: Secondary | ICD-10-CM | POA: Diagnosis not present

## 2016-12-12 DIAGNOSIS — Z96652 Presence of left artificial knee joint: Secondary | ICD-10-CM | POA: Diagnosis not present

## 2016-12-14 ENCOUNTER — Ambulatory Visit: Payer: PPO

## 2016-12-14 ENCOUNTER — Ambulatory Visit (INDEPENDENT_AMBULATORY_CARE_PROVIDER_SITE_OTHER): Payer: PPO | Admitting: Podiatry

## 2016-12-14 ENCOUNTER — Encounter: Payer: Self-pay | Admitting: Podiatry

## 2016-12-14 DIAGNOSIS — M779 Enthesopathy, unspecified: Secondary | ICD-10-CM

## 2016-12-14 DIAGNOSIS — B351 Tinea unguium: Secondary | ICD-10-CM | POA: Diagnosis not present

## 2016-12-14 DIAGNOSIS — R29898 Other symptoms and signs involving the musculoskeletal system: Secondary | ICD-10-CM | POA: Diagnosis not present

## 2016-12-14 DIAGNOSIS — M79609 Pain in unspecified limb: Secondary | ICD-10-CM | POA: Diagnosis not present

## 2016-12-14 DIAGNOSIS — Z96652 Presence of left artificial knee joint: Secondary | ICD-10-CM | POA: Diagnosis not present

## 2016-12-14 DIAGNOSIS — M25562 Pain in left knee: Secondary | ICD-10-CM | POA: Diagnosis not present

## 2016-12-14 DIAGNOSIS — M25662 Stiffness of left knee, not elsewhere classified: Secondary | ICD-10-CM | POA: Diagnosis not present

## 2016-12-14 NOTE — Progress Notes (Signed)
   Subjective:    Patient ID: Mary Elliott, female    DOB: [birth year], 76 y.o.   MRN: [id number]  HPI: She presents today with a chief concern of a possible ingrown to the tibial border hallux left. She states that she's had sharp pain in the area for the past few weeks into that upon herself to trim the edge of. States that she's had matrixectomy to this side before the rest of her nails she's concerned that they may be thick and fungal.  Review of Systems  All other systems reviewed and are negative.      Objective:   Physical Exam: Vital signs are stable she's alert and oriented 3. Pulses are palpable. Neurologic sensorium is intact. Deep tendon reflexes are intact. Muscle strength +5 over 5 dorsiflexion plantar flexors and inverters everters onto the musculature is intact. Orthopedic evaluation shows all joints distal to the ankle for range of motion or crepitation. Cutaneous evaluation demonstrates well-hydrated cutis she does have thickened dystrophic nails possibly mycotic sharply incurvated nail margin distally which has developed small callus along the nail border which I removed today and alleviated her symptoms. I also debrided the remainder of the nails.        Assessment & Plan:  Assessment: Dystrophic nails pain and limp secondary to possible onychomycosis.  Plan: Debridement of toenails 1 through 5 bilateral, service secondary to pain follow up with Dr. Prudence Davidson for future nail debridement.

## 2016-12-16 DIAGNOSIS — Z96652 Presence of left artificial knee joint: Secondary | ICD-10-CM | POA: Diagnosis not present

## 2016-12-19 DIAGNOSIS — Z96652 Presence of left artificial knee joint: Secondary | ICD-10-CM | POA: Diagnosis not present

## 2016-12-21 DIAGNOSIS — Z96652 Presence of left artificial knee joint: Secondary | ICD-10-CM | POA: Diagnosis not present

## 2016-12-23 DIAGNOSIS — Z96652 Presence of left artificial knee joint: Secondary | ICD-10-CM | POA: Diagnosis not present

## 2016-12-23 DIAGNOSIS — I10 Essential (primary) hypertension: Secondary | ICD-10-CM | POA: Diagnosis not present

## 2016-12-23 DIAGNOSIS — M25562 Pain in left knee: Secondary | ICD-10-CM | POA: Diagnosis not present

## 2016-12-23 DIAGNOSIS — M170 Bilateral primary osteoarthritis of knee: Secondary | ICD-10-CM | POA: Diagnosis not present

## 2016-12-23 DIAGNOSIS — K219 Gastro-esophageal reflux disease without esophagitis: Secondary | ICD-10-CM | POA: Diagnosis not present

## 2016-12-26 DIAGNOSIS — M25562 Pain in left knee: Secondary | ICD-10-CM | POA: Diagnosis not present

## 2016-12-26 DIAGNOSIS — Z96652 Presence of left artificial knee joint: Secondary | ICD-10-CM | POA: Diagnosis not present

## 2016-12-27 DIAGNOSIS — Z96652 Presence of left artificial knee joint: Secondary | ICD-10-CM | POA: Diagnosis not present

## 2016-12-30 DIAGNOSIS — R002 Palpitations: Secondary | ICD-10-CM | POA: Diagnosis not present

## 2016-12-30 DIAGNOSIS — Z0000 Encounter for general adult medical examination without abnormal findings: Secondary | ICD-10-CM | POA: Diagnosis not present

## 2016-12-30 DIAGNOSIS — E034 Atrophy of thyroid (acquired): Secondary | ICD-10-CM | POA: Diagnosis not present

## 2016-12-30 DIAGNOSIS — E7800 Pure hypercholesterolemia, unspecified: Secondary | ICD-10-CM | POA: Diagnosis not present

## 2016-12-30 DIAGNOSIS — F3341 Major depressive disorder, recurrent, in partial remission: Secondary | ICD-10-CM | POA: Diagnosis not present

## 2016-12-30 DIAGNOSIS — M170 Bilateral primary osteoarthritis of knee: Secondary | ICD-10-CM | POA: Diagnosis not present

## 2016-12-30 DIAGNOSIS — K219 Gastro-esophageal reflux disease without esophagitis: Secondary | ICD-10-CM | POA: Diagnosis not present

## 2016-12-30 DIAGNOSIS — I10 Essential (primary) hypertension: Secondary | ICD-10-CM | POA: Diagnosis not present

## 2017-01-04 ENCOUNTER — Encounter: Payer: Self-pay | Admitting: Podiatry

## 2017-01-04 ENCOUNTER — Ambulatory Visit (INDEPENDENT_AMBULATORY_CARE_PROVIDER_SITE_OTHER): Payer: PPO | Admitting: Podiatry

## 2017-01-04 DIAGNOSIS — L03031 Cellulitis of right toe: Secondary | ICD-10-CM

## 2017-01-04 NOTE — Progress Notes (Signed)
She presents today concerned redness along the proximal nail fold is possibly associated with bacterial infection and she hasn't left knee implant and does not want to get infected. She states that she has just recently finished up her around of antibiotics for a abscessed tooth.  Objective: Vital signs are stable she is alert and oriented 3. Pulses are palpable right. Hallux nail plate does demonstrate distal onycholysis and has been cut back all the way. There is some mild paronychia but it appears to be fungal in nature.  Assessment: Mild fungus tinea pedis to the skin.  Plan: Recommended she obtain Lamisil cream and use that on the nailbed and surrounding soft tissue.

## 2017-01-11 ENCOUNTER — Encounter: Payer: Self-pay | Admitting: Emergency Medicine

## 2017-01-11 ENCOUNTER — Emergency Department
Admission: EM | Admit: 2017-01-11 | Discharge: 2017-01-11 | Disposition: A | Discharge status: Home / Self Care | Payer: PPO | Attending: Emergency Medicine | Admitting: Emergency Medicine

## 2017-01-11 DIAGNOSIS — Z96652 Presence of left artificial knee joint: Secondary | ICD-10-CM | POA: Insufficient documentation

## 2017-01-11 DIAGNOSIS — E039 Hypothyroidism, unspecified: Secondary | ICD-10-CM | POA: Diagnosis not present

## 2017-01-11 DIAGNOSIS — Z79899 Other long term (current) drug therapy: Secondary | ICD-10-CM | POA: Diagnosis not present

## 2017-01-11 DIAGNOSIS — T7840XA Allergy, unspecified, initial encounter: Secondary | ICD-10-CM | POA: Diagnosis not present

## 2017-01-11 DIAGNOSIS — I10 Essential (primary) hypertension: Secondary | ICD-10-CM | POA: Diagnosis not present

## 2017-01-11 DIAGNOSIS — R21 Rash and other nonspecific skin eruption: Secondary | ICD-10-CM | POA: Diagnosis present

## 2017-01-11 MED ORDER — PREDNISONE 20 MG PO TABS: 40.0000 mg | ORAL_TABLET | Freq: Every day | ORAL | 0 refills | Status: AC

## 2017-01-11 MED ORDER — DIPHENHYDRAMINE HCL 50 MG/ML IJ SOLN
50.0000 mg | Freq: Once | INTRAMUSCULAR | Status: AC
  Administered 2017-01-11: 50 mg via INTRAVENOUS
  Filled 2017-01-11: qty 1

## 2017-01-11 MED ORDER — METHYLPREDNISOLONE SODIUM SUCC 125 MG IJ SOLR
125.0000 mg | Freq: Once | INTRAMUSCULAR | Status: AC
  Administered 2017-01-11: 125 mg via INTRAVENOUS
  Filled 2017-01-11: qty 2

## 2017-01-11 MED ORDER — FAMOTIDINE IN NACL 20-0.9 MG/50ML-% IV SOLN
20.0000 mg | Freq: Once | INTRAVENOUS | Status: AC
  Administered 2017-01-11: 20 mg via INTRAVENOUS
  Filled 2017-01-11: qty 50

## 2017-01-11 NOTE — ED Triage Notes (Signed)
Patient to ER for c/o severe hives after taking Clindamycin for dental procedure.

## 2017-01-11 NOTE — ED Provider Notes (Signed)
[facility] Emergency Department Provider Note __   First MD Initiated Contact with Patient 01/11/17 0210     (approximate)  I have reviewed the triage vital signs and the nursing notes.   HISTORY  Chief Complaint Allergic Reaction   HPI Mary Elliott is a 76 y.o. female with bolus of chronic medical conditions presents to the emergency department with generalized pruritic rash. Patient states that she noted a similar rash when she took clindamycin previously. Patient states that she took an additional dose of clindamycin yesterday before her dental procedure and noted pruritus following doing so. Patient states she woke this morning with generalized pruritic rash   Past Medical History:  Diagnosis Date  . Anxiety   . HOH (hard of hearing)   . Hypertension   . Hypothyroidism   . Kidney stones   . Tinnitus     Patient Active Problem List   Diagnosis Date Noted  . S/P total knee arthroplasty 11/14/2016    Past Surgical History:  Procedure Laterality Date  . ABDOMINAL HYSTERECTOMY    . basil cell     face  . BLEPHAROPLASTY    . HEMORROIDECTOMY    . KNEE ARTHROPLASTY Left 11/14/2016   Procedure: COMPUTER ASSISTED TOTAL KNEE ARTHROPLASTY;  Surgeon: Dereck Leep, MD;  Location: [facility] ORS;  Service: Orthopedics;  Laterality: Left;  . KNEE ARTHROSCOPY Left   . LIPOSUCTION    . PELVIC FLOOR REPAIR    . TOE SURGERY Right   . VARICOSE VEIN SURGERY      Prior to Admission medications   Medication Sig Start Date End Date Taking? Authorizing Provider  Cholecalciferol (VITAMIN D3) 2000 units capsule Take 2,000 Units by mouth daily.    [provider]  cyanocobalamin 1000 MCG tablet Take 1,000 mcg by mouth daily.    [provider]  Glucosamine-Chondroitin 500-400 MG CAPS Take 2 capsules by mouth daily.    [provider]  levothyroxine (SYNTHROID, LEVOTHROID) 100 MCG tablet Take 100 mcg by mouth daily before breakfast.     [provider]  metoprolol succinate (TOPROL-XL) 100 MG 24 hr tablet Take 100 mg by mouth daily. 12/30/15   [provider]  Probiotic Product (ALIGN) 4 MG CAPS Take 4 mg by mouth daily.    [provider]  sertraline (ZOLOFT) 50 MG tablet Take 75 mg by mouth daily. Takes 1.5 tablet    [provider]  traMADol (ULTRAM) 50 MG tablet Take 1-2 tablets (50-100 mg total) by mouth every 4 (four) hours as needed for moderate pain. 11/16/16   Watt Climes, PA    Allergies Clindamycin/lincomycin; Nitrofurantoin; Hydrocodone; Vesicare [solifenacin succinate]; Duloxetine; Nsaids; Oxybutynin; Solifenacin; Sulfa antibiotics; and Tegaserod  Family History  Problem Relation Age of Onset  . Breast cancer Neg Hx     Social History Social History  Substance Use Topics  . Smoking status: Never Smoker  . Smokeless tobacco: Never Used  . Alcohol use 2.4 oz/week    2 Glasses of wine, 2 Cans of beer per week    Review of Systems Constitutional: No fever/chills Eyes: No visual changes. ENT: No sore throat. Cardiovascular: Denies chest pain. Respiratory: Denies shortness of breath. Gastrointestinal: No abdominal pain.  No nausea, no vomiting.  No diarrhea.  No constipation. Genitourinary: Negative for dysuria. Musculoskeletal: Negative for neck pain.  Negative for back pain. Integumentary: Negative for rash. Neurological: Negative for headaches, focal weakness or numbness.   ____________________________________________   PHYSICAL EXAM:  VITAL SIGNS: ED Triage Vitals  Enc Vitals Group     BP 01/11/17 0201 (!) 147/87     Pulse Rate 01/11/17 0201 69     Resp 01/11/17 0201 17     Temp 01/11/17 0201 99.6 F (37.6 C)     Temp Source 01/11/17 0201 Oral     SpO2 01/11/17 0201 97 %     Weight 01/11/17 0201 73.9 kg (163 lb)     Height 01/11/17 0201 1.575 m (5\' 2" )     Head Circumference --      Peak Flow --      Pain Score 01/11/17 0239 0     Pain Loc --       Pain Edu? --      Excl. in [place]? --     Constitutional: Alert and oriented. Well appearing and in no acute distress. Eyes: Conjunctivae are normal. Head: Atraumatic. Mouth/Throat: Mucous membranes are moist.  Oropharynx non-erythematous. Neck: No stridor.   Cardiovascular: Normal rate, regular rhythm. Good peripheral circulation. Grossly normal heart sounds. Respiratory: Normal respiratory effort.  No retractions. Lungs CTAB. Gastrointestinal: Soft and nontender. No distention.  Musculoskeletal: No lower extremity tenderness nor edema. No gross deformities of extremities. Neurologic:  Normal speech and language. No gross focal neurologic deficits are appreciated.  Skin: Generalized hives with evidence of excoriation Psychiatric: Mood and affect are normal. Speech and behavior are normal.  _________________  Procedures   ____________________________________________   INITIAL IMPRESSION / ASSESSMENT AND PLAN / ED COURSE  Pertinent labs & imaging results that were available during my care of the patient were reviewed by me and considered in my medical decision making (see chart for details).  76 year old female presented with history of physical exam consistent with allergic reaction most likely secondary to clindamycin. Patient given an initial 50 mg IV Pepcid 20 mg and Solu-Medrol 125 mg.      ____________________________________________  FINAL CLINICAL IMPRESSION(S) / ED DIAGNOSES  Final diagnoses:  Allergic reaction, initial encounter     MEDICATIONS GIVEN DURING THIS VISIT:  Medications  famotidine (PEPCID) IVPB 20 mg premix (20 mg Intravenous New Bag/Given 01/11/17 0225)  methylPREDNISolone sodium succinate (SOLU-MEDROL) 125 mg/2 mL injection 125 mg (125 mg Intravenous Given 01/11/17 0225)  diphenhydrAMINE (BENADRYL) injection 50 mg (50 mg Intravenous Given 01/11/17 0225)     NEW OUTPATIENT MEDICATIONS STARTED DURING THIS VISIT:  New Prescriptions   No medications  on file    Modified Medications   No medications on file    Discontinued Medications   No medications on file     Note:  This document was prepared using Dragon voice recognition software and may include unintentional dictation errors.    Gregor Hams, MD 01/11/17 [phone]

## 2017-01-12 ENCOUNTER — Other Ambulatory Visit: Payer: Self-pay | Admitting: Internal Medicine

## 2017-01-12 DIAGNOSIS — Z1231 Encounter for screening mammogram for malignant neoplasm of breast: Secondary | ICD-10-CM

## 2017-01-18 ENCOUNTER — Ambulatory Visit: Payer: PPO | Admitting: Podiatry

## 2017-01-20 DIAGNOSIS — F3341 Major depressive disorder, recurrent, in partial remission: Secondary | ICD-10-CM | POA: Diagnosis not present

## 2017-01-20 DIAGNOSIS — K219 Gastro-esophageal reflux disease without esophagitis: Secondary | ICD-10-CM | POA: Diagnosis not present

## 2017-01-20 DIAGNOSIS — E7800 Pure hypercholesterolemia, unspecified: Secondary | ICD-10-CM | POA: Diagnosis not present

## 2017-01-20 DIAGNOSIS — E034 Atrophy of thyroid (acquired): Secondary | ICD-10-CM | POA: Diagnosis not present

## 2017-01-20 DIAGNOSIS — T50905D Adverse effect of unspecified drugs, medicaments and biological substances, subsequent encounter: Secondary | ICD-10-CM | POA: Diagnosis not present

## 2017-01-20 DIAGNOSIS — I10 Essential (primary) hypertension: Secondary | ICD-10-CM | POA: Diagnosis not present

## 2017-01-23 DIAGNOSIS — Z471 Aftercare following joint replacement surgery: Secondary | ICD-10-CM | POA: Diagnosis not present

## 2017-01-24 DIAGNOSIS — D2272 Melanocytic nevi of left lower limb, including hip: Secondary | ICD-10-CM | POA: Diagnosis not present

## 2017-01-24 DIAGNOSIS — L570 Actinic keratosis: Secondary | ICD-10-CM | POA: Diagnosis not present

## 2017-01-24 DIAGNOSIS — D2261 Melanocytic nevi of right upper limb, including shoulder: Secondary | ICD-10-CM | POA: Diagnosis not present

## 2017-01-24 DIAGNOSIS — Z85828 Personal history of other malignant neoplasm of skin: Secondary | ICD-10-CM | POA: Diagnosis not present

## 2017-01-24 DIAGNOSIS — D225 Melanocytic nevi of trunk: Secondary | ICD-10-CM | POA: Diagnosis not present

## 2017-01-24 DIAGNOSIS — X32XXXA Exposure to sunlight, initial encounter: Secondary | ICD-10-CM | POA: Diagnosis not present

## 2017-01-30 DIAGNOSIS — F410 Panic disorder [episodic paroxysmal anxiety] without agoraphobia: Secondary | ICD-10-CM | POA: Diagnosis not present

## 2017-01-30 DIAGNOSIS — F3342 Major depressive disorder, recurrent, in full remission: Secondary | ICD-10-CM | POA: Diagnosis not present

## 2017-01-30 DIAGNOSIS — F411 Generalized anxiety disorder: Secondary | ICD-10-CM | POA: Diagnosis not present

## 2017-02-13 ENCOUNTER — Ambulatory Visit
Admission: RE | Admit: 2017-02-13 | Discharge: 2017-02-13 | Disposition: A | Discharge status: Home / Self Care | Payer: PPO | Source: Ambulatory Visit | Attending: Internal Medicine | Admitting: Internal Medicine

## 2017-02-13 DIAGNOSIS — Z1231 Encounter for screening mammogram for malignant neoplasm of breast: Secondary | ICD-10-CM | POA: Diagnosis not present

## 2017-03-02 ENCOUNTER — Encounter
Admission: RE | Admit: 2017-03-02 | Discharge: 2017-03-02 | Disposition: A | Discharge status: Home / Self Care | Payer: PPO | Source: Ambulatory Visit | Attending: Orthopedic Surgery | Admitting: Orthopedic Surgery

## 2017-03-02 DIAGNOSIS — Z01812 Encounter for preprocedural laboratory examination: Secondary | ICD-10-CM | POA: Insufficient documentation

## 2017-03-02 HISTORY — DX: Other specified postprocedural states: Z98.890

## 2017-03-02 HISTORY — DX: Personal history of urinary calculi: Z87.442

## 2017-03-02 HISTORY — DX: Gastro-esophageal reflux disease without esophagitis: K21.9

## 2017-03-02 HISTORY — DX: Malignant (primary) neoplasm, unspecified: C80.1

## 2017-03-02 HISTORY — DX: Nausea with vomiting, unspecified: R11.2

## 2017-03-02 LAB — C-REACTIVE PROTEIN

## 2017-03-02 LAB — URINALYSIS, ROUTINE W REFLEX MICROSCOPIC
Bilirubin Urine: NEGATIVE
GLUCOSE, UA: NEGATIVE mg/dL
Hgb urine dipstick: NEGATIVE
Ketones, ur: NEGATIVE mg/dL
LEUKOCYTES UA: NEGATIVE
Nitrite: NEGATIVE
PH: 7 (ref 5.0–8.0)
PROTEIN: NEGATIVE mg/dL
SPECIFIC GRAVITY, URINE: 1.008 (ref 1.005–1.030)

## 2017-03-02 LAB — CBC
HCT: 40.3 % (ref 35.0–47.0)
HEMOGLOBIN: 13.4 g/dL (ref 12.0–16.0)
MCH: 30.1 pg (ref 26.0–34.0)
MCHC: 33.3 g/dL (ref 32.0–36.0)
MCV: 90.6 fL (ref 80.0–100.0)
Platelets: 304 10*3/uL (ref 150–440)
RBC: 4.45 MIL/uL (ref 3.80–5.20)
RDW: 14.7 % — ABNORMAL HIGH (ref 11.5–14.5)
WBC: 5.8 10*3/uL (ref 3.6–11.0)

## 2017-03-02 LAB — COMPREHENSIVE METABOLIC PANEL
ALK PHOS: 68 U/L (ref 38–126)
ALT: 13 U/L — AB (ref 14–54)
ANION GAP: 10 (ref 5–15)
AST: 22 U/L (ref 15–41)
Albumin: 4.6 g/dL (ref 3.5–5.0)
BILIRUBIN TOTAL: 0.5 mg/dL (ref 0.3–1.2)
BUN: 22 mg/dL — ABNORMAL HIGH (ref 6–20)
CALCIUM: 9.3 mg/dL (ref 8.9–10.3)
CO2: 26 mmol/L (ref 22–32)
CREATININE: 0.68 mg/dL (ref 0.44–1.00)
Chloride: 101 mmol/L (ref 101–111)
GFR calc Af Amer: >60 mL/min (ref >60)
Glucose, Bld: 87 mg/dL (ref 65–99)
Potassium: 3.6 mmol/L (ref 3.5–5.1)
Sodium: 137 mmol/L (ref 135–145)
TOTAL PROTEIN: 7.9 g/dL (ref 6.5–8.1)

## 2017-03-02 LAB — SURGICAL PCR SCREEN
MRSA, PCR: NEGATIVE
Staphylococcus aureus: NEGATIVE

## 2017-03-02 LAB — TYPE AND SCREEN
ABO/RH(D): O POS
Antibody Screen: NEGATIVE

## 2017-03-02 LAB — APTT: aPTT: 30 seconds (ref 24–36)

## 2017-03-02 LAB — PROTIME-INR
INR: 0.97
PROTHROMBIN TIME: 12.8 s (ref 11.4–15.2)

## 2017-03-02 LAB — SEDIMENTATION RATE: Sed Rate: 13 mm/hr (ref 0–30)

## 2017-03-02 NOTE — Patient Instructions (Signed)
Your procedure is scheduled on: Wednesday Nov. 7, 2018. Report to Same Day Surgery. To find out your arrival time please call [phone] between 1PM - 3PM on Tuesday Nov. 6, 2018.  Remember: Instructions that are not followed completely may result in serious medical risk, up to and including death, or upon the discretion of your surgeon and anesthesiologist your surgery may need to be rescheduled.     _X__ 1. Do not eat food after midnight the night before your procedure.                 No gum chewing or hard candies. You may drink clear liquids up to 2 hours                 before you are scheduled to arrive for your surgery- DO not drink clear                 liquids within 2 hours of the start of your surgery.                 Clear Liquids include:  water, apple juice without pulp, clear carbohydrate                 drink such as Clearfast of [place], Black Coffee or Tea (Do not add                 anything to coffee or tea).     _X__ 2.  No Alcohol for 24 hours before or after surgery.   ___ 3.  Do Not Smoke or use e-cigarettes For 24 Hours Prior to Your Surgery.                 Do not use any chewable tobacco products for at least 6 hours prior to                 surgery.  ____  4.  Bring all medications with you on the day of surgery if instructed.   _x___  5.  Notify your doctor if there is any change in your medical condition      (cold, fever, infections).     Do not wear jewelry, make-up, hairpins, clips or nail polish. Do not wear lotions, powders, or perfumes. You may wear deodorant. Do not shave 48 hours prior to surgery. Men may shave face and neck. Do not bring valuables to the hospital.    [facility] is not responsible for any belongings or valuables.  Contacts, dentures or bridgework may not be worn into surgery. Leave your suitcase in the car. After surgery it may be brought to your room. For patients admitted to the hospital,  discharge time is determined by your treatment team.   Patients discharged the day of surgery will not be allowed to drive home.   Please read over the following fact sheets that you were given:   Preparing for surgery  __x__ Take these medicines the morning of surgery with A SIP OF WATER:    1. levothyroxine (SYNTHROID, LEVOTHROID)  2. metoprolol succinate (TOPROL-XL)   3. sertraline (ZOLOFT)   ____ Fleet Enema (as directed)   _x___ Use CHG Soap as directed  ____ Use inhalers on the day of surgery  ____ Stop metformin 2 days prior to surgery    ____ Take 1/2 of usual insulin dose the night before surgery. No insulin the morning          of surgery.  ____ Stop Coumadin/Plavix/aspirin on does not apply.  ____ Stop Anti-inflammatories on does not apply   __x__ Stop supplements: Glucosamine-Chondroitin, until after surgery.    ____ Bring C-Pap to the hospital.

## 2017-03-03 LAB — URINE CULTURE
CULTURE: NO GROWTH
SPECIAL REQUESTS: NORMAL

## 2017-03-14 DIAGNOSIS — M25561 Pain in right knee: Secondary | ICD-10-CM | POA: Diagnosis not present

## 2017-03-14 MED ORDER — TRANEXAMIC ACID 1000 MG/10ML IV SOLN
1000.0000 mg | INTRAVENOUS | Status: DC
  Filled 2017-03-14: qty 10

## 2017-03-14 MED ORDER — CEFAZOLIN SODIUM-DEXTROSE 2-4 GM/100ML-% IV SOLN: 2.0000 g | INTRAVENOUS | Status: DC

## 2017-03-15 ENCOUNTER — Inpatient Hospital Stay: Payer: PPO | Admitting: Anesthesiology

## 2017-03-15 ENCOUNTER — Inpatient Hospital Stay: Payer: PPO

## 2017-03-15 ENCOUNTER — Encounter
Admission: RE | Disposition: A | Discharge status: Home Health | Payer: Self-pay | Source: Ambulatory Visit | Attending: Orthopedic Surgery

## 2017-03-15 ENCOUNTER — Inpatient Hospital Stay
Admission: RE | Admit: 2017-03-15 | Discharge: 2017-03-17 | DRG: 470 | Disposition: A | Discharge status: Home Health | Payer: PPO | Source: Ambulatory Visit | Attending: Orthopedic Surgery | Admitting: Orthopedic Surgery

## 2017-03-15 ENCOUNTER — Encounter: Payer: Self-pay | Admitting: Orthopedic Surgery

## 2017-03-15 DIAGNOSIS — F419 Anxiety disorder, unspecified: Secondary | ICD-10-CM | POA: Diagnosis present

## 2017-03-15 DIAGNOSIS — Z85828 Personal history of other malignant neoplasm of skin: Secondary | ICD-10-CM | POA: Diagnosis not present

## 2017-03-15 DIAGNOSIS — K219 Gastro-esophageal reflux disease without esophagitis: Secondary | ICD-10-CM | POA: Diagnosis present

## 2017-03-15 DIAGNOSIS — Z87891 Personal history of nicotine dependence: Secondary | ICD-10-CM

## 2017-03-15 DIAGNOSIS — H9190 Unspecified hearing loss, unspecified ear: Secondary | ICD-10-CM | POA: Diagnosis present

## 2017-03-15 DIAGNOSIS — I10 Essential (primary) hypertension: Secondary | ICD-10-CM | POA: Diagnosis present

## 2017-03-15 DIAGNOSIS — M1711 Unilateral primary osteoarthritis, right knee: Secondary | ICD-10-CM | POA: Diagnosis not present

## 2017-03-15 DIAGNOSIS — M25561 Pain in right knee: Secondary | ICD-10-CM | POA: Diagnosis present

## 2017-03-15 DIAGNOSIS — Z96659 Presence of unspecified artificial knee joint: Secondary | ICD-10-CM

## 2017-03-15 DIAGNOSIS — E039 Hypothyroidism, unspecified: Secondary | ICD-10-CM | POA: Diagnosis not present

## 2017-03-15 DIAGNOSIS — Z471 Aftercare following joint replacement surgery: Secondary | ICD-10-CM | POA: Diagnosis not present

## 2017-03-15 DIAGNOSIS — Z96651 Presence of right artificial knee joint: Secondary | ICD-10-CM | POA: Diagnosis not present

## 2017-03-15 HISTORY — PX: KNEE ARTHROPLASTY: SHX992

## 2017-03-15 SURGERY — ARTHROPLASTY, KNEE, TOTAL, USING IMAGELESS COMPUTER-ASSISTED NAVIGATION
Anesthesia: Spinal | Site: Knee | Laterality: Right | Wound class: Clean

## 2017-03-15 MED ORDER — NEOMYCIN-POLYMYXIN B GU 40-200000 IR SOLN
Status: AC
  Filled 2017-03-15: qty 20

## 2017-03-15 MED ORDER — LACTATED RINGERS IV SOLN
INTRAVENOUS | Status: DC
  Administered 2017-03-15 (×2): via INTRAVENOUS

## 2017-03-15 MED ORDER — NEOMYCIN-POLYMYXIN B GU 40-200000 IR SOLN
Status: DC | PRN
  Administered 2017-03-15: 14 mL

## 2017-03-15 MED ORDER — VITAMIN D 1000 UNITS PO TABS
2000.0000 [IU] | ORAL_TABLET | Freq: Every day | ORAL | Status: DC
  Administered 2017-03-15 – 2017-03-17 (×3): 2000 [IU] via ORAL
  Filled 2017-03-15 (×3): qty 2

## 2017-03-15 MED ORDER — ACETAMINOPHEN 650 MG RE SUPP: 650.0000 mg | RECTAL | Status: DC | PRN

## 2017-03-15 MED ORDER — LEVOTHYROXINE SODIUM 100 MCG PO TABS
100.0000 ug | ORAL_TABLET | Freq: Every day | ORAL | Status: DC
  Administered 2017-03-16 – 2017-03-17 (×2): 100 ug via ORAL
  Filled 2017-03-15 (×2): qty 1

## 2017-03-15 MED ORDER — FLEET ENEMA 7-19 GM/118ML RE ENEM: 1.0000 | ENEMA | Freq: Once | RECTAL | Status: DC | PRN

## 2017-03-15 MED ORDER — ONDANSETRON HCL 4 MG PO TABS: 4.0000 mg | ORAL_TABLET | Freq: Four times a day (QID) | ORAL | Status: DC | PRN

## 2017-03-15 MED ORDER — MORPHINE SULFATE (PF) 2 MG/ML IV SOLN: 2.0000 mg | INTRAVENOUS | Status: DC | PRN

## 2017-03-15 MED ORDER — DIPHENHYDRAMINE HCL 12.5 MG/5ML PO ELIX: 12.5000 mg | ORAL_SOLUTION | ORAL | Status: DC | PRN

## 2017-03-15 MED ORDER — PROPOFOL 500 MG/50ML IV EMUL
INTRAVENOUS | Status: DC | PRN
  Administered 2017-03-15: 50 ug/kg/min via INTRAVENOUS

## 2017-03-15 MED ORDER — BUPIVACAINE HCL (PF) 0.5 % IJ SOLN
INTRAMUSCULAR | Status: DC | PRN
  Administered 2017-03-15: 2.5 mL

## 2017-03-15 MED ORDER — PROPOFOL 500 MG/50ML IV EMUL
INTRAVENOUS | Status: AC
  Filled 2017-03-15: qty 150

## 2017-03-15 MED ORDER — ACETAMINOPHEN 325 MG PO TABS
650.0000 mg | ORAL_TABLET | ORAL | Status: DC | PRN
  Administered 2017-03-17: 650 mg via ORAL
  Filled 2017-03-15: qty 2

## 2017-03-15 MED ORDER — ENOXAPARIN SODIUM 30 MG/0.3ML ~~LOC~~ SOLN
30.0000 mg | Freq: Two times a day (BID) | SUBCUTANEOUS | Status: DC
  Administered 2017-03-16 – 2017-03-17 (×3): 30 mg via SUBCUTANEOUS
  Filled 2017-03-15 (×3): qty 0.3

## 2017-03-15 MED ORDER — TETRACAINE HCL 1 % IJ SOLN
INTRAMUSCULAR | Status: DC | PRN
  Administered 2017-03-15: 5 mg via INTRASPINAL

## 2017-03-15 MED ORDER — LIDOCAINE HCL (PF) 2 % IJ SOLN
INTRAMUSCULAR | Status: AC
Start: 2017-03-15 — End: 2017-03-15
  Filled 2017-03-15: qty 10

## 2017-03-15 MED ORDER — DEXTROSE 5 % IV SOLN
2.0000 g | Freq: Four times a day (QID) | INTRAVENOUS | Status: AC
  Administered 2017-03-15 – 2017-03-16 (×4): 2 g via INTRAVENOUS
  Filled 2017-03-15 (×4): qty 2000

## 2017-03-15 MED ORDER — GLYCOPYRROLATE 0.2 MG/ML IJ SOLN
INTRAMUSCULAR | Status: DC | PRN
  Administered 2017-03-15: 0.2 mg via INTRAVENOUS

## 2017-03-15 MED ORDER — FERROUS SULFATE 325 (65 FE) MG PO TABS
325.0000 mg | ORAL_TABLET | Freq: Two times a day (BID) | ORAL | Status: DC
  Administered 2017-03-16 – 2017-03-17 (×3): 325 mg via ORAL
  Filled 2017-03-15 (×3): qty 1

## 2017-03-15 MED ORDER — SERTRALINE HCL 50 MG PO TABS
75.0000 mg | ORAL_TABLET | Freq: Every day | ORAL | Status: DC
  Administered 2017-03-16 – 2017-03-17 (×2): 75 mg via ORAL
  Filled 2017-03-15 (×2): qty 2

## 2017-03-15 MED ORDER — PROBIOTIC-PREBIOTIC 1-250 BILLION-MG PO CAPS: 1.0000 | ORAL_CAPSULE | Freq: Every morning | ORAL | Status: DC

## 2017-03-15 MED ORDER — VITAMIN B-12 1000 MCG PO TABS
1000.0000 ug | ORAL_TABLET | Freq: Every day | ORAL | Status: DC
  Administered 2017-03-15 – 2017-03-17 (×3): 1000 ug via ORAL
  Filled 2017-03-15 (×3): qty 1

## 2017-03-15 MED ORDER — CEFAZOLIN SODIUM-DEXTROSE 2-4 GM/100ML-% IV SOLN
INTRAVENOUS | Status: AC
  Filled 2017-03-15: qty 100

## 2017-03-15 MED ORDER — PANTOPRAZOLE SODIUM 40 MG PO TBEC
40.0000 mg | DELAYED_RELEASE_TABLET | Freq: Two times a day (BID) | ORAL | Status: DC
  Administered 2017-03-15 – 2017-03-17 (×4): 40 mg via ORAL
  Filled 2017-03-15 (×4): qty 1

## 2017-03-15 MED ORDER — OXYCODONE HCL 5 MG PO TABS
10.0000 mg | ORAL_TABLET | ORAL | Status: DC | PRN
  Administered 2017-03-17: 10 mg via ORAL
  Filled 2017-03-15: qty 2

## 2017-03-15 MED ORDER — SODIUM CHLORIDE 0.9 % IV SOLN
INTRAVENOUS | Status: DC
  Administered 2017-03-15: 22:00:00 via INTRAVENOUS

## 2017-03-15 MED ORDER — OXYCODONE HCL 5 MG PO TABS
5.0000 mg | ORAL_TABLET | ORAL | Status: DC | PRN
  Administered 2017-03-16 – 2017-03-17 (×5): 5 mg via ORAL
  Filled 2017-03-15 (×5): qty 1

## 2017-03-15 MED ORDER — SENNOSIDES-DOCUSATE SODIUM 8.6-50 MG PO TABS
1.0000 | ORAL_TABLET | Freq: Two times a day (BID) | ORAL | Status: DC
  Administered 2017-03-15 – 2017-03-17 (×4): 1 via ORAL
  Filled 2017-03-15 (×4): qty 1

## 2017-03-15 MED ORDER — MEPERIDINE HCL 50 MG/ML IJ SOLN: 6.2500 mg | INTRAMUSCULAR | Status: DC | PRN

## 2017-03-15 MED ORDER — ALUM & MAG HYDROXIDE-SIMETH 200-200-20 MG/5ML PO SUSP: 30.0000 mL | ORAL | Status: DC | PRN

## 2017-03-15 MED ORDER — LORATADINE 10 MG PO TABS
10.0000 mg | ORAL_TABLET | Freq: Every day | ORAL | Status: DC
  Administered 2017-03-16 – 2017-03-17 (×2): 10 mg via ORAL
  Filled 2017-03-15 (×3): qty 1

## 2017-03-15 MED ORDER — CEFAZOLIN SODIUM-DEXTROSE 2-4 GM/100ML-% IV SOLN: 2.0000 g | Freq: Four times a day (QID) | INTRAVENOUS | Status: DC

## 2017-03-15 MED ORDER — ACETAMINOPHEN 10 MG/ML IV SOLN
INTRAVENOUS | Status: AC
  Filled 2017-03-15: qty 100

## 2017-03-15 MED ORDER — BUPIVACAINE HCL (PF) 0.25 % IJ SOLN
INTRAMUSCULAR | Status: DC | PRN
  Administered 2017-03-15: 30 mL

## 2017-03-15 MED ORDER — BUPIVACAINE LIPOSOME 1.3 % IJ SUSP
INTRAMUSCULAR | Status: AC
  Filled 2017-03-15: qty 20

## 2017-03-15 MED ORDER — ACETAMINOPHEN 10 MG/ML IV SOLN
1000.0000 mg | Freq: Four times a day (QID) | INTRAVENOUS | Status: AC
  Administered 2017-03-15 – 2017-03-16 (×4): 1000 mg via INTRAVENOUS
  Filled 2017-03-15 (×6): qty 100

## 2017-03-15 MED ORDER — FENTANYL CITRATE (PF) 100 MCG/2ML IJ SOLN: 25.0000 ug | INTRAMUSCULAR | Status: DC | PRN

## 2017-03-15 MED ORDER — FENTANYL CITRATE (PF) 100 MCG/2ML IJ SOLN
INTRAMUSCULAR | Status: AC
  Filled 2017-03-15: qty 2

## 2017-03-15 MED ORDER — GLYCOPYRROLATE 0.2 MG/ML IJ SOLN
INTRAMUSCULAR | Status: AC
  Filled 2017-03-15: qty 1

## 2017-03-15 MED ORDER — DEXAMETHASONE SODIUM PHOSPHATE 10 MG/ML IJ SOLN
INTRAMUSCULAR | Status: DC | PRN
  Administered 2017-03-15: 5 mg via INTRAVENOUS

## 2017-03-15 MED ORDER — ONDANSETRON HCL 4 MG/2ML IJ SOLN: 4.0000 mg | Freq: Four times a day (QID) | INTRAMUSCULAR | Status: DC | PRN

## 2017-03-15 MED ORDER — RISAQUAD PO CAPS
1.0000 | ORAL_CAPSULE | Freq: Every morning | ORAL | Status: DC
  Administered 2017-03-16 – 2017-03-17 (×2): 1 via ORAL
  Filled 2017-03-15 (×2): qty 1

## 2017-03-15 MED ORDER — CEFAZOLIN SODIUM-DEXTROSE 2-3 GM-%(50ML) IV SOLR
INTRAVENOUS | Status: DC | PRN
  Administered 2017-03-15: 2 g via INTRAVENOUS

## 2017-03-15 MED ORDER — BUPIVACAINE HCL (PF) 0.25 % IJ SOLN
INTRAMUSCULAR | Status: AC
  Filled 2017-03-15: qty 60

## 2017-03-15 MED ORDER — SODIUM CHLORIDE 0.9 % IV SOLN
INTRAVENOUS | Status: DC | PRN
  Administered 2017-03-15: 25 ug/min via INTRAVENOUS

## 2017-03-15 MED ORDER — TETRACAINE HCL 1 % IJ SOLN
INTRAMUSCULAR | Status: AC
  Filled 2017-03-15: qty 2

## 2017-03-15 MED ORDER — PROMETHAZINE HCL 25 MG/ML IJ SOLN: 6.2500 mg | INTRAMUSCULAR | Status: DC | PRN

## 2017-03-15 MED ORDER — MIDAZOLAM HCL 5 MG/5ML IJ SOLN
INTRAMUSCULAR | Status: DC | PRN
  Administered 2017-03-15: 1 mg via INTRAVENOUS

## 2017-03-15 MED ORDER — SODIUM CHLORIDE 0.9 % IV SOLN
1000.0000 mg | Freq: Once | INTRAVENOUS | Status: AC
  Administered 2017-03-15: 1000 mg via INTRAVENOUS
  Filled 2017-03-15: qty 10

## 2017-03-15 MED ORDER — PHENOL 1.4 % MT LIQD
1.0000 | OROMUCOSAL | Status: DC | PRN
  Filled 2017-03-15: qty 177

## 2017-03-15 MED ORDER — PROPOFOL 10 MG/ML IV BOLUS
INTRAVENOUS | Status: DC | PRN
  Administered 2017-03-15: 20 mg via INTRAVENOUS

## 2017-03-15 MED ORDER — FENTANYL CITRATE (PF) 100 MCG/2ML IJ SOLN
INTRAMUSCULAR | Status: DC | PRN
  Administered 2017-03-15: 25 ug via INTRAVENOUS

## 2017-03-15 MED ORDER — MAGNESIUM HYDROXIDE 400 MG/5ML PO SUSP
30.0000 mL | Freq: Every day | ORAL | Status: DC | PRN
  Administered 2017-03-17: 30 mL via ORAL
  Filled 2017-03-15: qty 30

## 2017-03-15 MED ORDER — TRANEXAMIC ACID 1000 MG/10ML IV SOLN
INTRAVENOUS | Status: DC | PRN
  Administered 2017-03-15: 1000 mg via INTRAVENOUS

## 2017-03-15 MED ORDER — SODIUM CHLORIDE 0.9 % IV SOLN
INTRAVENOUS | Status: DC | PRN
  Administered 2017-03-15: 60 mL

## 2017-03-15 MED ORDER — LIDOCAINE HCL (CARDIAC) 20 MG/ML IV SOLN
INTRAVENOUS | Status: DC | PRN
  Administered 2017-03-15: 60 mg via INTRAVENOUS

## 2017-03-15 MED ORDER — MENTHOL 3 MG MT LOZG
1.0000 | LOZENGE | OROMUCOSAL | Status: DC | PRN
  Filled 2017-03-15: qty 9

## 2017-03-15 MED ORDER — METOPROLOL SUCCINATE ER 50 MG PO TB24
100.0000 mg | ORAL_TABLET | Freq: Every day | ORAL | Status: DC
  Administered 2017-03-17: 100 mg via ORAL
  Filled 2017-03-15 (×2): qty 2

## 2017-03-15 MED ORDER — SODIUM CHLORIDE 0.9 % IJ SOLN
INTRAMUSCULAR | Status: AC
  Filled 2017-03-15: qty 50

## 2017-03-15 MED ORDER — BISACODYL 10 MG RE SUPP
10.0000 mg | Freq: Every day | RECTAL | Status: DC | PRN
  Administered 2017-03-17: 10 mg via RECTAL
  Filled 2017-03-15: qty 1

## 2017-03-15 MED ORDER — CHLORHEXIDINE GLUCONATE 4 % EX LIQD: 60.0000 mL | Freq: Once | CUTANEOUS | Status: DC

## 2017-03-15 MED ORDER — MIDAZOLAM HCL 2 MG/2ML IJ SOLN
INTRAMUSCULAR | Status: AC
  Filled 2017-03-15: qty 2

## 2017-03-15 MED ORDER — ACETAMINOPHEN 10 MG/ML IV SOLN
INTRAVENOUS | Status: DC | PRN
  Administered 2017-03-15: 1000 mg via INTRAVENOUS

## 2017-03-15 MED ORDER — METOCLOPRAMIDE HCL 10 MG PO TABS
10.0000 mg | ORAL_TABLET | Freq: Three times a day (TID) | ORAL | Status: DC
  Administered 2017-03-15 – 2017-03-17 (×6): 10 mg via ORAL
  Filled 2017-03-15 (×6): qty 1

## 2017-03-15 SURGICAL SUPPLY — 65 items
BATTERY INSTRU NAVIGATION (MISCELLANEOUS) ×12 IMPLANT
BLADE SAW 1 (BLADE) ×3 IMPLANT
BLADE SAW 1/2 (BLADE) ×3 IMPLANT
BLADE SAW 70X12.5 (BLADE) IMPLANT
CANISTER SUCT 1200ML W/VALVE (MISCELLANEOUS) ×3 IMPLANT
CANISTER SUCT 3000ML PPV (MISCELLANEOUS) ×6 IMPLANT
CAPT KNEE TOTAL 3 ATTUNE ×3 IMPLANT
CATH TRAY METER 16FR LF (MISCELLANEOUS) ×3 IMPLANT
CEMENT HV SMART SET (Cement) ×6 IMPLANT
COOLER POLAR GLACIER W/PUMP (MISCELLANEOUS) ×3 IMPLANT
CUFF TOURN 24 STER (MISCELLANEOUS) IMPLANT
CUFF TOURN 30 STER DUAL PORT (MISCELLANEOUS) ×3 IMPLANT
DRAPE SHEET LG 3/4 BI-LAMINATE (DRAPES) ×3 IMPLANT
DRSG DERMACEA 8X12 NADH (GAUZE/BANDAGES/DRESSINGS) ×3 IMPLANT
DRSG OPSITE POSTOP 4X14 (GAUZE/BANDAGES/DRESSINGS) ×3 IMPLANT
DRSG TEGADERM 4X4.75 (GAUZE/BANDAGES/DRESSINGS) ×3 IMPLANT
DURAPREP 26ML APPLICATOR (WOUND CARE) ×6 IMPLANT
ELECT CAUTERY BLADE 6.4 (BLADE) ×3 IMPLANT
ELECT REM PT RETURN 9FT ADLT (ELECTROSURGICAL) ×3
ELECTRODE REM PT RTRN 9FT ADLT (ELECTROSURGICAL) ×1 IMPLANT
EVACUATOR 1/8 PVC DRAIN (DRAIN) ×3 IMPLANT
EX-PIN ORTHOLOCK NAV 4X150 (PIN) ×6 IMPLANT
GLOVE BIOGEL M STRL SZ7.5 (GLOVE) ×6 IMPLANT
GLOVE BIOGEL PI IND STRL 7.0 (GLOVE) ×4 IMPLANT
GLOVE BIOGEL PI IND STRL 9 (GLOVE) ×1 IMPLANT
GLOVE BIOGEL PI INDICATOR 7.0 (GLOVE) ×8
GLOVE BIOGEL PI INDICATOR 9 (GLOVE) ×2
GLOVE INDICATOR 8.0 STRL GRN (GLOVE) ×3 IMPLANT
GLOVE PROTEXIS LATEX SZ 7.5 (GLOVE) ×12 IMPLANT
GLOVE SURG SYN 9.0  PF PI (GLOVE) ×2
GLOVE SURG SYN 9.0 PF PI (GLOVE) ×1 IMPLANT
GOWN STRL REUS W/ TWL LRG LVL3 (GOWN DISPOSABLE) ×2 IMPLANT
GOWN STRL REUS W/TWL 2XL LVL3 (GOWN DISPOSABLE) ×3 IMPLANT
GOWN STRL REUS W/TWL LRG LVL3 (GOWN DISPOSABLE) ×4
HOLDER FOLEY CATH W/STRAP (MISCELLANEOUS) ×3 IMPLANT
HOOD PEEL AWAY FLYTE STAYCOOL (MISCELLANEOUS) ×6 IMPLANT
KIT RM TURNOVER STRD PROC AR (KITS) ×3 IMPLANT
KNIFE SCULPS 14X20 (INSTRUMENTS) ×3 IMPLANT
LABEL OR SOLS (LABEL) ×3 IMPLANT
NDL SAFETY 18GX1.5 (NEEDLE) ×3 IMPLANT
NEEDLE SPNL 20GX3.5 QUINCKE YW (NEEDLE) ×6 IMPLANT
NS IRRIG 500ML POUR BTL (IV SOLUTION) ×3 IMPLANT
PACK TOTAL KNEE (MISCELLANEOUS) ×3 IMPLANT
PAD WRAPON POLAR KNEE (MISCELLANEOUS) ×1 IMPLANT
PIN DRILL QUICK PACK ×3 IMPLANT
PIN FIXATION 1/8DIA X 3INL (PIN) ×3 IMPLANT
PULSAVAC PLUS IRRIG FAN TIP (DISPOSABLE) ×3
SOL .9 NS 3000ML IRR  AL (IV SOLUTION) ×2
SOL .9 NS 3000ML IRR UROMATIC (IV SOLUTION) ×1 IMPLANT
SOL PREP PVP 2OZ (MISCELLANEOUS) ×3
SOLUTION PREP PVP 2OZ (MISCELLANEOUS) ×1 IMPLANT
SPONGE DRAIN TRACH 4X4 STRL 2S (GAUZE/BANDAGES/DRESSINGS) ×3 IMPLANT
STAPLER SKIN PROX 35W (STAPLE) ×3 IMPLANT
STRAP TIBIA SHORT (MISCELLANEOUS) ×3 IMPLANT
SUCTION FRAZIER HANDLE 10FR (MISCELLANEOUS) ×2
SUCTION TUBE FRAZIER 10FR DISP (MISCELLANEOUS) ×1 IMPLANT
SUT VIC AB 0 CT1 36 (SUTURE) ×3 IMPLANT
SUT VIC AB 1 CT1 36 (SUTURE) ×6 IMPLANT
SUT VIC AB 2-0 CT2 27 (SUTURE) ×3 IMPLANT
SYR 20CC LL (SYRINGE) ×3 IMPLANT
SYR 30ML LL (SYRINGE) ×6 IMPLANT
TIP FAN IRRIG PULSAVAC PLUS (DISPOSABLE) ×1 IMPLANT
TOWEL OR 17X26 4PK STRL BLUE (TOWEL DISPOSABLE) ×3 IMPLANT
TOWER CARTRIDGE SMART MIX (DISPOSABLE) ×3 IMPLANT
WRAPON POLAR PAD KNEE (MISCELLANEOUS) ×3

## 2017-03-15 NOTE — H&P (Signed)
The patient has been re-examined, and the chart reviewed, and there have been no interval changes to the documented history and physical.    The risks, benefits, and alternatives have been discussed at length. The patient expressed understanding of the risks benefits and agreed with plans for surgical intervention.  Jadence Kinlaw P. Quentina Fronek, Jr. M.D.    

## 2017-03-15 NOTE — Anesthesia Post-op Follow-up Note (Signed)
Anesthesia QCDR form completed.        

## 2017-03-15 NOTE — Anesthesia Procedure Notes (Signed)
Spinal  Patient location during procedure: OR Start time: 03/15/2017 3:28 PM End time: 03/15/2017 3:32 PM Staffing Anesthesiologist: Penwarden, Amy, MD Resident/CRNA: Peralta, Nicole, CRNA Performed: resident/CRNA  Preanesthetic Checklist Completed: patient identified, site marked, surgical consent, pre-op evaluation, timeout performed, IV checked, risks and benefits discussed and monitors and equipment checked Spinal Block Patient position: sitting Prep: ChloraPrep Patient monitoring: heart rate, continuous pulse ox, blood pressure and cardiac monitor Approach: midline Location: L4-5 Injection technique: single-shot Needle Needle type: Introducer and Pencil-Tip  Needle gauge: 24 G Needle length: 9 cm Additional Notes Negative paresthesia. Negative blood return. Positive free-flowing CSF. Expiration date of kit checked and confirmed. Patient tolerated procedure well, without complications.       

## 2017-03-15 NOTE — Op Note (Signed)
OPERATIVE NOTE  DATE OF SURGERY:  03/15/2017  PATIENT NAME:  Tiziana Cislo   DOB: 01-15-1941  MRN: [id number]  PRE-OPERATIVE DIAGNOSIS: Degenerative arthrosis of the right knee, primary  POST-OPERATIVE DIAGNOSIS:  Same  PROCEDURE:  Right total knee arthroplasty using computer-assisted navigation  SURGEON:  Marciano Sequin. M.D.  ASSISTANT:  Vance Peper, PA (present and scrubbed throughout the case, critical for assistance with exposure, retraction, instrumentation, and closure)  ANESTHESIA: spinal  ESTIMATED BLOOD LOSS: 100 mL  FLUIDS REPLACED: 1300 mL of crystalloid  TOURNIQUET TIME: 94 minutes  DRAINS: 2 medium Hemovac drains  SOFT TISSUE RELEASES: Anterior cruciate ligament, posterior cruciate ligament, deep medial collateral ligament, patellofemoral ligament  IMPLANTS UTILIZED: DePuy Attune size 6N posterior stabilized femoral component (cemented), size 5 rotating platform tibial component (cemented), 35 mm medialized dome patella (cemented), and a 5 mm stabilized rotating platform polyethylene insert.  INDICATIONS FOR SURGERY: Tresa Jolley is a 76 y.o. year old female with a long history of progressive knee pain. X-rays demonstrated severe degenerative changes in tricompartmental fashion. The patient had not seen any significant improvement despite conservative nonsurgical intervention. After discussion of the risks and benefits of surgical intervention, the patient expressed understanding of the risks benefits and agree with plans for total knee arthroplasty.   The risks, benefits, and alternatives were discussed at length including but not limited to the risks of infection, bleeding, nerve injury, stiffness, blood clots, the need for revision surgery, cardiopulmonary complications, among others, and they were willing to proceed.  PROCEDURE IN DETAIL: The patient was brought into the operating room and, after adequate spinal anesthesia was achieved, a tourniquet was  placed on the patient's upper thigh. The patient's knee and leg were cleaned and prepped with alcohol and DuraPrep and draped in the usual sterile fashion. A "timeout" was performed as per usual protocol. The lower extremity was exsanguinated using an Esmarch, and the tourniquet was inflated to 300 mmHg. An anterior longitudinal incision was made followed by a standard mid vastus approach. The deep fibers of the medial collateral ligament were elevated in a subperiosteal fashion off of the medial flare of the tibia so as to maintain a continuous soft tissue sleeve. The patella was subluxed laterally and the patellofemoral ligament was incised. Inspection of the knee demonstrated severe degenerative changes with full-thickness loss of articular cartilage. Osteophytes were debrided using a rongeur. Anterior and posterior cruciate ligaments were excised. Two 4.0 mm Schanz pins were inserted in the femur and into the tibia for attachment of the array of trackers used for computer-assisted navigation. Hip center was identified using a circumduction technique. Distal landmarks were mapped using the computer. The distal femur and proximal tibia were mapped using the computer. The distal femoral cutting guide was positioned using computer-assisted navigation so as to achieve a 5 distal valgus cut. The femur was sized and it was felt that a size 6N femoral component was appropriate. A size 6 femoral cutting guide was positioned and the anterior cut was performed and verified using the computer. This was followed by completion of the posterior and chamfer cuts. Femoral cutting guide for the central box was then positioned in the center box cut was performed.  Attention was then directed to the proximal tibia. Medial and lateral menisci were excised. The extramedullary tibial cutting guide was positioned using computer-assisted navigation so as to achieve a 0 varus-valgus alignment and 3 posterior slope. The cut was  performed and verified using the computer. The proximal tibia was sized  and it was felt that a size 5 tibial tray was appropriate. Tibial and femoral trials were inserted followed by insertion of a 5 mm polyethylene insert. This allowed for excellent mediolateral soft tissue balancing both in flexion and in full extension. Finally, the patella was cut and prepared so as to accommodate a 35 mm medialized dome patella. A patella trial was placed and the knee was placed through a range of motion with excellent patellar tracking appreciated. The femoral trial was removed after debridement of posterior osteophytes. The central post-hole for the tibial component was reamed followed by insertion of a keel punch. Tibial trials were then removed. Cut surfaces of bone were irrigated with copious amounts of normal saline with antibiotic solution using pulsatile lavage and then suctioned dry. Polymethylmethacrylate cement was prepared in the usual fashion using a vacuum mixer. Cement was applied to the cut surface of the proximal tibia as well as along the undersurface of a size 5 rotating platform tibial component. Tibial component was positioned and impacted into place. Excess cement was removed using [employer] fast streamer. Cement was then applied to the cut surfaces of the femur as well as along the posterior flanges of the size 6N femoral component. The femoral component was positioned and impacted into place. Excess cement was removed using [employer] fast streamer. A 5 mm polyethylene trial was inserted and the knee was brought into full extension with steady axial compression applied. Finally, cement was applied to the backside of a 35 mm medialized dome patella and the patellar component was positioned and patellar clamp applied. Excess cement was removed using [employer] fast streamer. After adequate curing of the cement, the tourniquet was deflated after a total tourniquet time of 94 minutes. Hemostasis was achieved using electrocautery. The  knee was irrigated with copious amounts of normal saline with antibiotic solution using pulsatile lavage and then suctioned dry. 20 mL of 1.3% Exparel and 60 mL of 0.25% Marcaine in 40 mL of normal saline was injected along the posterior capsule, medial and lateral gutters, and along the arthrotomy site. A 5 mm stabilized rotating platform polyethylene insert was inserted and the knee was placed through a range of motion with excellent mediolateral soft tissue balancing appreciated and excellent patellar tracking noted. 2 medium drains were placed in the wound bed and brought out through separate stab incisions. The medial parapatellar portion of the incision was reapproximated using interrupted sutures of #1 Vicryl. Subcutaneous tissue was approximated in layers using first #0 Vicryl followed #2-0 Vicryl. The skin was approximated with skin staples. A sterile dressing was applied.  The patient tolerated the procedure well and was transported to the recovery room in stable condition.    Tjay Velazquez P. Holley Bouche., M.D.

## 2017-03-15 NOTE — Transfer of Care (Signed)
Immediate Anesthesia Transfer of Care Note  Patient: Mary Elliott  Procedure(s) Performed: COMPUTER ASSISTED TOTAL KNEE ARTHROPLASTY (Right Knee)  Patient Location: PACU  Anesthesia Type:Spinal  Level of Consciousness: awake, alert  and oriented  Airway & Oxygen Therapy: Patient Spontanous Breathing and Patient connected to face mask oxygen  Post-op Assessment: Report given to RN and Post -op Vital signs reviewed and stable  Post vital signs: Reviewed  Last Vitals:  Vitals:   03/15/17 1408 03/15/17 1853  BP: (!) 156/86 129/84  Pulse: 64 69  Resp: 16 18  Temp: 36.9 C   SpO2: 100% 96%    Last Pain:  Vitals:   03/15/17 1408  TempSrc: Temporal         Complications: No apparent anesthesia complications

## 2017-03-15 NOTE — Anesthesia Preprocedure Evaluation (Signed)
Anesthesia Evaluation  Patient identified by MRN, date of birth, ID band Patient awake    Reviewed: Allergy & Precautions, NPO status , Patient's Chart, lab work & pertinent test results  History of Anesthesia Complications (+) PONV and history of anesthetic complications  Airway Mallampati: I  TM Distance: >3 FB Neck ROM: Full    Dental no notable dental hx.    Pulmonary neg sleep apnea, neg COPD, former smoker,    breath sounds clear to auscultation- rhonchi (-) wheezing      Cardiovascular hypertension, Pt. on medications (-) Past MI, (-) Cardiac Stents and (-) CABG  Rhythm:Regular Rate:Normal - Systolic murmurs and - Diastolic murmurs    Neuro/Psych Anxiety negative neurological ROS     GI/Hepatic Neg liver ROS, GERD  ,  Endo/Other  neg diabetesHypothyroidism   Renal/GU negative Renal ROS     Musculoskeletal negative musculoskeletal ROS (+)   Abdominal (+) + obese,   Peds  Hematology negative hematology ROS (+)   Anesthesia Other Findings Past Medical History: No date: Anxiety 1999: Cancer ([place])     Comment:  basil cell on face No date: GERD (gastroesophageal reflux disease)     Comment:  history of No date: History of kidney stones     Comment:  twice No date: HOH (hard of hearing) No date: Hypertension No date: Hypothyroidism No date: PONV (postoperative nausea and vomiting)     Comment:  nausea only with 2 prior surgeries No date: Tinnitus   Reproductive/Obstetrics                             Anesthesia Physical Anesthesia Plan  ASA: II  Anesthesia Plan: Spinal   Post-op Pain Management:    Induction:   PONV Risk Score and Plan: 3 and Propofol infusion, Dexamethasone and Ondansetron  Airway Management Planned: Natural Airway  Additional Equipment:   Intra-op Plan:   Post-operative Plan:   Informed Consent: I have reviewed the patients History and Physical,  chart, labs and discussed the procedure including the risks, benefits and alternatives for the proposed anesthesia with the patient or authorized representative who has indicated his/her understanding and acceptance.   Dental advisory given  Plan Discussed with: CRNA and Anesthesiologist  Anesthesia Plan Comments:         Anesthesia Quick Evaluation

## 2017-03-15 NOTE — Discharge Instructions (Signed)
°  Instructions after Total Knee Replacement ° ° Cielo Arias P. Lynx Goodrich, Jr., M.D.    ° Dept. of Orthopaedics & Sports Medicine ° [facility] ° [address] ° [place], [place]  [address] ° Phone: [phone]   Fax: [phone] ° °  °DIET: °• Drink plenty of non-alcoholic fluids. °• Resume your normal diet. Include foods high in fiber. ° °ACTIVITY:  °• You may use crutches or a walker with weight-bearing as tolerated, unless instructed otherwise. °• You may be weaned off of the walker or crutches by your Physical Therapist.  °• Do NOT place pillows under the knee. Anything placed under the knee could limit your ability to straighten the knee.   °• Continue doing gentle exercises. Exercising will reduce the pain and swelling, increase motion, and prevent muscle weakness.   °• Please continue to use the TED compression stockings for 6 weeks. You may remove the stockings at night, but should reapply them in the morning. °• Do not drive or operate any equipment until instructed. ° °WOUND CARE:  °• Continue to use the PolarCare or ice packs periodically to reduce pain and swelling. °• You may bathe or shower after the staples are removed at the first office visit following surgery. ° °MEDICATIONS: °• You may resume your regular medications. °• Please take the pain medication as prescribed on the medication. °• Do not take pain medication on an empty stomach. °• You have been given a prescription for a blood thinner (Lovenox or Coumadin). Please take the medication as instructed. (NOTE: After completing a 2 week course of Lovenox, take one Enteric-coated aspirin once a day. This along with elevation will help reduce the possibility of phlebitis in your operated leg.) °• Do not drive or drink alcoholic beverages when taking pain medications. ° °CALL THE OFFICE FOR: °• Temperature above 101 degrees °• Excessive bleeding or drainage on the dressing. °• Excessive swelling, coldness, or paleness of the toes. °• Persistent  nausea and vomiting. ° °FOLLOW-UP:  °• You should have an appointment to return to the office in 10-14 days after surgery. °• Arrangements have been made for continuation of Physical Therapy (either home therapy or outpatient therapy). °  °

## 2017-03-16 ENCOUNTER — Encounter: Payer: Self-pay | Admitting: Orthopedic Surgery

## 2017-03-16 LAB — BASIC METABOLIC PANEL
Anion gap: 7 (ref 5–15)
BUN: 15 mg/dL (ref 6–20)
CHLORIDE: 105 mmol/L (ref 101–111)
CO2: 25 mmol/L (ref 22–32)
CREATININE: 0.68 mg/dL (ref 0.44–1.00)
Calcium: 8.6 mg/dL — ABNORMAL LOW (ref 8.9–10.3)
GFR calc Af Amer: >60 mL/min (ref >60)
GFR calc non Af Amer: >60 mL/min (ref >60)
Glucose, Bld: 150 mg/dL — ABNORMAL HIGH (ref 65–99)
Potassium: 3.9 mmol/L (ref 3.5–5.1)
Sodium: 137 mmol/L (ref 135–145)

## 2017-03-16 LAB — CBC
HEMATOCRIT: 36.7 % (ref 35.0–47.0)
HEMOGLOBIN: 12.6 g/dL (ref 12.0–16.0)
MCH: 31 pg (ref 26.0–34.0)
MCHC: 34.2 g/dL (ref 32.0–36.0)
MCV: 90.4 fL (ref 80.0–100.0)
Platelets: 250 10*3/uL (ref 150–440)
RBC: 4.06 MIL/uL (ref 3.80–5.20)
RDW: 14.5 % (ref 11.5–14.5)
WBC: 7.5 10*3/uL (ref 3.6–11.0)

## 2017-03-16 MED ORDER — OXYCODONE HCL 5 MG PO TABS: 5.0000 mg | ORAL_TABLET | ORAL | 0 refills | Status: DC | PRN

## 2017-03-16 MED ORDER — ENOXAPARIN SODIUM 40 MG/0.4ML ~~LOC~~ SOLN: 40.0000 mg | SUBCUTANEOUS | 0 refills | Status: DC

## 2017-03-16 MED ORDER — ENOXAPARIN SODIUM 40 MG/0.4ML ~~LOC~~ SOLN: 40.0000 mg | SUBCUTANEOUS | Status: DC

## 2017-03-16 NOTE — Care Management (Addendum)
RNCM  [facility] and [facility] said that "it apparently was called to another pharmacy".  Patient said it may be at [facility] on [address] [phone]. Patient said she had a problem filling it before at [facility] which is why she wanted [facility]. I attempted to reach out to [facility] however they did not pick up the phone after multiple attempts.  It appears that Lovenox was e-faxed to [place]- I will re-attempt to contact [facility]. 85$ at [place]. Patient aware and agrees to price. Patient also aware that [facility] at [facility] not [facility].  [facility] have [facility] unless [facility] can provide it for less than 85$.

## 2017-03-16 NOTE — Evaluation (Signed)
Physical Therapy Evaluation Patient Details Name: Mary Elliott MRN: [id number] DOB: 05-14-1940 Today's Date: 03/16/2017   History of Present Illness  76 y/o female s/p R total knee replacement 11/7.  She had her L knee replaced a few months ago this summer.  Clinical Impression  Pt did very well with PT exam.  She was able to easily do SLRs, showed great mobility, was able to walk ~100 ft with consistent cadence and good confidence (O2 99%, HR 59 after ambulation).  She showed great mobility in the knee with extension and flexion and generally easily achieved POD1 expectations.  Pt should be able to go home with HHPT w/o issue.      Follow Up Recommendations Home health PT    Equipment Recommendations       Recommendations for Other Services       Precautions / Restrictions Precautions Precautions: Fall Restrictions Weight Bearing Restrictions: Yes RLE Weight Bearing: Weight bearing as tolerated      Mobility  Bed Mobility Overal bed mobility: Modified Independent             General bed mobility comments: Pt able to get herself up to EOB with light bed rail use, good confidence  Transfers Overall transfer level: Independent Equipment used: Rolling walker (2 wheeled)             General transfer comment: Pt able to rise to standing with very little cuing and good safety awareness.  Ambulation/Gait Ambulation/Gait assistance: Supervision Ambulation Distance (Feet): 100 Feet Assistive device: Rolling walker (2 wheeled)       General Gait Details: After a few initial stiff/hesitant steps she was able to increase cadence with consistent walker motion, good WBing tolerance on the R (with no buckling) and generally did very well especially the first time walking since sx.   Stairs            Wheelchair Mobility    Modified [name] (Stroke Patients Only)       Balance Overall balance assessment: Independent                                           Pertinent Vitals/Pain Pain Assessment: 0-10 Pain Score: 4  Pain Location: R knee    Home Living Family/patient expects to be discharged to:: Private residence Living Arrangements: Spouse/significant other Available Help at Discharge: Family Type of Home: House Home Access: Stairs to enter Entrance Stairs-Rails: Psychiatric nurse of Steps: 3 Home Layout: One level Home Equipment: Environmental consultant - 2 wheels;Cane - single point;Bedside commode;Shower seat - built in;Grab bars - tub/shower      Prior Function Level of Independence: Independent         Comments: Independent with ADLs/IADLs, Active, Driving, and manging own medication.     Hand Dominance        Extremity/Trunk Assessment   Upper Extremity Assessment Upper Extremity Assessment: Overall WFL for tasks assessed    Lower Extremity Assessment Lower Extremity Assessment: Overall WFL for tasks assessed(expected post-op R LE limitations)       Communication   Communication: No difficulties  Cognition Arousal/Alertness: Awake/alert Behavior During Therapy: WFL for tasks assessed/performed Overall Cognitive Status: Within Functional Limits for tasks assessed  General Comments      Exercises Total Joint Exercises Ankle Circles/Pumps: AROM;10 reps Quad Sets: 10 reps;Strengthening Short Arc Quad: 10 reps;Strengthening Heel Slides: Strengthening;10 reps Hip ABduction/ADduction: Strengthening;10 reps Straight Leg Raises: AROM;10 reps;Strengthening Knee Flexion: PROM;5 reps Goniometric ROM: full TKE, >80 deg flexion   Assessment/Plan    PT Assessment Patient needs continued PT services  PT Problem List Decreased strength;Decreased range of motion;Decreased activity tolerance;Decreased balance;Decreased mobility;Decreased knowledge of use of DME;Decreased safety awareness;Pain       PT Treatment Interventions DME  instruction;Gait training;Stair training;Functional mobility training;Therapeutic activities;Therapeutic exercise;Balance training;Neuromuscular re-education;Patient/family education    PT Goals (Current goals can be found in the Care Plan section)  Acute Rehab PT Goals Patient Stated Goal: do as well as she did with the L TKA PT Goal Formulation: With patient Time For Goal Achievement: 03/30/17 Potential to Achieve Goals: Good    Frequency BID   Barriers to discharge        Co-evaluation               AM-PAC PT "6 Clicks" Daily Activity  Outcome Measure Difficulty turning over in bed (including adjusting bedclothes, sheets and blankets)?: None Difficulty moving from lying on back to sitting on the side of the bed? : None Difficulty sitting down on and standing up from a chair with arms (e.g., wheelchair, bedside commode, etc,.)?: A Little Help needed moving to and from a bed to chair (including a wheelchair)?: None Help needed walking in hospital room?: None Help needed climbing 3-5 steps with a railing? : A Little 6 Click Score: 22    End of Session Equipment Utilized During Treatment: Gait belt Activity Tolerance: Patient tolerated treatment well Patient left: with chair alarm set;with call bell/phone within reach;with family/visitor present Nurse Communication: Mobility status PT Visit Diagnosis: Muscle weakness (generalized) (M62.81);Difficulty in walking, not elsewhere classified (R26.2)    Time: 2956-2130 PT Time Calculation (min) (ACUTE ONLY): 32 min   Charges:   PT Evaluation $PT Eval Low Complexity: 1 Low PT Treatments $Therapeutic Exercise: 8-22 mins   PT G Codes:   PT G-Codes **NOT FOR INPATIENT CLASS** Functional Assessment Tool Used: AM-PAC 6 Clicks Basic Mobility Functional Limitation: Mobility: Walking and moving around Mobility: Walking and Moving Around Current Status (Q6578): At least 20 percent but less than 40 percent impaired, limited or  restricted Mobility: Walking and Moving Around Goal Status [phone]): At least 1 percent but less than 20 percent impaired, limited or restricted    Kreg Shropshire, DPT 03/16/2017, 11:04 AM

## 2017-03-16 NOTE — Plan of Care (Signed)
  Progressing Education: Knowledge of General Education information will improve 03/16/2017 0527 - Progressing by Koni Kannan, Lucille Passy, RN Health Behavior/Discharge Planning: Ability to manage health-related needs will improve 03/16/2017 0527 - Progressing by Ashliegh Parekh, Lucille Passy, RN Clinical Measurements: Ability to maintain clinical measurements within normal limits will improve 03/16/2017 0527 - Progressing by Mikai Meints, Lucille Passy, RN Will remain free from infection 03/16/2017 0527 - Progressing by Terek Bee, Lucille Passy, RN Diagnostic test results will improve 03/16/2017 0527 - Progressing by Annali Lybrand, Lucille Passy, RN Respiratory complications will improve 03/16/2017 0527 - Progressing by Bryna Colander, RN Cardiovascular complication will be avoided 03/16/2017 0527 - Progressing by Tyaira Heward, Lucille Passy, RN Activity: Risk for activity intolerance will decrease 03/16/2017 0527 - Progressing by Bryna Colander, RN Nutrition: Adequate nutrition will be maintained 03/16/2017 0527 - Progressing by Bryna Colander, RN Coping: Level of anxiety will decrease 03/16/2017 0527 - Progressing by Bryna Colander, RN Elimination: Will not experience complications related to bowel motility 03/16/2017 0527 - Progressing by Carlas Vandyne, Lucille Passy, RN Will not experience complications related to urinary retention 03/16/2017 0527 - Progressing by Roshon Duell, Lucille Passy, RN Pain Managment: General experience of comfort will improve 03/16/2017 0527 - Progressing by Demone Lyles, Lucille Passy, RN Safety: Ability to remain free from injury will improve 03/16/2017 0527 - Progressing by Neftaly Swiss, Lucille Passy, RN Skin Integrity: Risk for impaired skin integrity will decrease 03/16/2017 0527 - Progressing by Bryna Colander, RN Education: Knowledge of the prescribed therapeutic regimen will improve 03/16/2017 0527 - Progressing by Tabita Corbo, Lucille Passy, RN Activity: Ability to avoid complications of mobility  impairment will improve 03/16/2017 0527 - Progressing by Josealfredo Adkins, Lucille Passy, RN Range of joint motion will improve 03/16/2017 0527 - Progressing by Bryna Colander, RN Clinical Measurements: Postoperative complications will be avoided or minimized 03/16/2017 0527 - Progressing by Bryna Colander, RN Pain Management: Pain level will decrease with appropriate interventions 03/16/2017 0527 - Progressing by Bryna Colander, RN Skin Integrity: Signs of wound healing will improve 03/16/2017 0527 - Progressing by Rileyann Florance, Lucille Passy, RN

## 2017-03-16 NOTE — Progress Notes (Signed)
   Subjective: 1 Day Post-Op Procedure(s) (LRB): COMPUTER ASSISTED TOTAL KNEE ARTHROPLASTY (Right) Patient reports pain as moderate.   Patient is well, and has had no acute complaints or problems We will start therapy today.  Plan is to go Home after hospital stay. no nausea and no vomiting Patient denies any chest pains or shortness of breath. Rested off and on during the night. No complaints.  Objective: Vital signs in last 24 hours: Temp:  [97.5 F (36.4 C)-98.4 F (36.9 C)] 97.7 F (36.5 C) (11/08 0400) Pulse Rate:  [46-77] 46 (11/08 0400) Resp:  [11-19] 17 (11/08 0400) BP: (93-156)/(54-97) 93/56 (11/08 0400) SpO2:  [95 %-100 %] 95 % (11/08 0400) Weight:  [74.8 kg (165 lb)] 74.8 kg (165 lb) (11/07 1408) Heels are non tender and elevated off the bed using rolled towels Intake/Output from previous day: 11/07 0701 - 11/08 0700 In: 3105 [P.O.:600; I.V.:2205; IV Piggyback:300] Out: 1601 [Urine:3325; Drains:140; Blood:100] Intake/Output this shift: No intake/output data recorded.  Recent Labs    03/16/17 0347  HGB 12.6   Recent Labs    03/16/17 0347  WBC 7.5  RBC 4.06  HCT 36.7  PLT 250   Recent Labs    03/16/17 0347  NA 137  K 3.9  CL 105  CO2 25  BUN 15  CREATININE 0.68  GLUCOSE 150*  CALCIUM 8.6*   No results for input(s): LABPT, INR in the last 72 hours.  EXAM General - Patient is Alert, Appropriate and Oriented Extremity - Neurologically intact Neurovascular intact Sensation intact distally Intact pulses distally Dorsiflexion/Plantar flexion intact Compartment soft Dressing - dressing C/D/I Motor Function - intact, moving foot and toes well on exam.  Patient able to do straight leg raise on her own  Past Medical History:  Diagnosis Date  . Anxiety   . Cancer ([place]) 1999   basil cell on face  . GERD (gastroesophageal reflux disease)    history of  . History of kidney stones    twice  . HOH (hard of hearing)   . Hypertension   .  Hypothyroidism   . PONV (postoperative nausea and vomiting)    nausea only with 2 prior surgeries  . Tinnitus     Assessment/Plan: 1 Day Post-Op Procedure(s) (LRB): COMPUTER ASSISTED TOTAL KNEE ARTHROPLASTY (Right) Active Problems:   S/P total knee arthroplasty  Estimated body mass index is 30.18 kg/m as calculated from the following:   Height as of this encounter: 5\' 2"  (1.575 m).   Weight as of this encounter: 74.8 kg (165 lb). Advance diet Up with therapy D/C IV fluids Plan for discharge tomorrow Discharge home with home health  Labs: Were reviewed and acceptable DVT Prophylaxis - Lovenox, Foot Pumps and TED hose Weight-Bearing as tolerated to right leg D/C O2 and Pulse OX and try on Room Air Begin working on bowel movement Labs tomorrow morning  Mary Elliott R. [place] 03/16/2017, 7:08 AM

## 2017-03-16 NOTE — Progress Notes (Signed)
Patient is A+O with no signs of distress.  Pain is well controlled with current medications. Appears to have slept well.  Foley catheter removed this am.

## 2017-03-16 NOTE — Progress Notes (Signed)
Physical Therapy Treatment Patient Details Name: Mary Elliott MRN: [id number] DOB: 1940/09/27 Today's Date: 03/16/2017    History of Present Illness 76 y/o female s/p R total knee replacement 11/7.  She had her L knee replaced a few months ago this summer.    PT Comments    Pt continues to do very well with PT showing good quad control, solid SLRs and ROM 0-90.  She does have decreased speed and confidence with ambulation but overall did well and was safe.  Pt with pain increased from 4/10 to 8/10 with activity but remains motivated t/o.   Follow Up Recommendations  Home health PT     Equipment Recommendations       Recommendations for Other Services Rehab consult     Precautions / Restrictions Precautions Precautions: Fall Restrictions Weight Bearing Restrictions: Yes RLE Weight Bearing: Weight bearing as tolerated    Mobility  Bed Mobility Overal bed mobility: Modified Independent             General bed mobility comments: Pt able to get to EOB w/o assist.   Transfers Overall transfer level: Independent Equipment used: Rolling walker (2 wheeled)             General transfer comment: good safety awareness  Ambulation/Gait Ambulation/Gait assistance: Supervision Ambulation Distance (Feet): 100 Feet Assistive device: Rolling walker (2 wheeled)       General Gait Details: Pt able to ambulate with good safety, but does have a little more hesitation with WBing and needed increased UE use and slower gait speed   Stairs            Wheelchair Mobility    Modified [name] (Stroke Patients Only)       Balance Overall balance assessment: Independent                                          Cognition Arousal/Alertness: Awake/alert Behavior During Therapy: WFL for tasks assessed/performed Overall Cognitive Status: Within Functional Limits for tasks assessed                                        Exercises  Total Joint Exercises Ankle Circles/Pumps: AROM;10 reps Quad Sets: Strengthening;10 reps Gluteal Sets: Strengthening;10 reps Short Arc Quad: Strengthening;10 reps Heel Slides: Strengthening;10 reps Hip ABduction/ADduction: Strengthening;10 reps Straight Leg Raises: AROM;10 reps;Strengthening Knee Flexion: PROM;5 reps Other Exercises Other Exercises: pt/spouse educated in compression stocking mgt, polar care mgt, AE/DME, and falls prevention strategies to maximiz safety and functional independence. Pt/spouse verbalized understanding.     General Comments        Pertinent Vitals/Pain Pain Assessment: 0-10 Pain Score: 4 (increases significantly with ROM and other exercises) Pain Location: R knee Pain Intervention(s): Limited activity within patient's tolerance;Monitored during session;Ice applied    Home Living Family/patient expects to be discharged to:: Private residence Living Arrangements: Spouse/significant other Available Help at Discharge: Family;Available 24 hours/day Type of Home: House Home Access: Stairs to enter Entrance Stairs-Rails: Right;Left Home Layout: One level Home Equipment: Environmental consultant - 2 wheels;Cane - single point;Bedside commode;Shower seat - built in;Grab bars - tub/shower;Hand held shower head      Prior Function Level of Independence: Independent      Comments: Independent with ADLs/IADLs, Active, Driving, and manging own medication.   PT Goals (current  goals can now be found in the care plan section) Acute Rehab PT Goals Patient Stated Goal: go home and get back to PLOF Progress towards PT goals: Progressing toward goals    Frequency    BID      PT Plan Current plan remains appropriate    Co-evaluation              AM-PAC PT "6 Clicks" Daily Activity  Outcome Measure  Difficulty turning over in bed (including adjusting bedclothes, sheets and blankets)?: None Difficulty moving from lying on back to sitting on the side of the bed? :  None Difficulty sitting down on and standing up from a chair with arms (e.g., wheelchair, bedside commode, etc,.)?: A Little Help needed moving to and from a bed to chair (including a wheelchair)?: None Help needed walking in hospital room?: None Help needed climbing 3-5 steps with a railing? : A Little 6 Click Score: 22    End of Session Equipment Utilized During Treatment: Gait belt Activity Tolerance: Patient tolerated treatment well Patient left: with chair alarm set;with call bell/phone within reach;with family/visitor present   PT Visit Diagnosis: Muscle weakness (generalized) (M62.81);Difficulty in walking, not elsewhere classified (R26.2)     Time: 8676-7209 PT Time Calculation (min) (ACUTE ONLY): 40 min  Charges:  $Gait Training: 8-22 mins $Therapeutic Exercise: 23-37 mins                    G Codes:       Kreg Shropshire, DPT 03/16/2017, 4:28 PM

## 2017-03-16 NOTE — Progress Notes (Signed)
Clinical Social Worker (CSW) received SNF consult. PT is recommending home health. RN case manager aware of above. Please reconsult if future social work needs arise. CSW signing off.   Isela Stantz, LCSW [phone] 

## 2017-03-16 NOTE — Care Management Note (Signed)
Case Management Note  Patient Details  Name: Mary Elliott MRN: [id number] Date of Birth: November 16, 1940  Subjective/Objective:                  RNCM met with patient and her husband to discuss discharge planning Patient would like to return home at discharge. She states she had similar procedure in July this year and would like to use [facility] again. She states she will not need any DME; she has a front-wheeled walker at home. [phone].  Per Dr. Marry Guan he requests that I call in Lovenox/generic okay 109m injection daily for 14 days- no refills.    Action/Plan: [facility]. Referral to [facility]. RNCM will follow.    Expected Discharge Date:  03/20/17               Expected Discharge Plan:     In-House Referral:     Discharge planning Services  CM Consult  Post Acute Care Choice:  Home Health Choice offered to:  Patient, Spouse  DME Arranged:    DME Agency:     HH Arranged:  [facility]  Status of Service:  In process, will continue to follow  If discussed at Long Length of Stay Meetings, dates discussed:    Additional Comments:  AMarshell Garfinkel RN 03/16/2017, 12:09 PM

## 2017-03-16 NOTE — Progress Notes (Signed)
Patient legs dangled at bedside, tolerated well.

## 2017-03-16 NOTE — Evaluation (Signed)
Occupational Therapy Evaluation Patient Details Name: Mary Elliott MRN: [id number] DOB: Feb 18, 1941 Today's Date: 03/16/2017    History of Present Illness 76 y/o female s/p R total knee replacement 11/7.  She had her L knee replaced a few months ago this summer.   Clinical Impression   Pt is 76 year old female s/p TKR.  Pt was independent in all ADLs prior to surgery and is eager to return to PLOF.  Pt currently requires minimal assist for LB dressing while in seated position due to pain and limited AROM of R knee. Pt/spouse with great home set up from previous L TKA surgery several months ago. Pt/spouse educated in compression stocking mgt, polar care mgt, AE/DME, and falls prevention strategies to maximize safety and functional independence. Pt/spouse verbalized understanding, no additional OT needs at this time. Will sign off.       Follow Up Recommendations  No OT follow up    Equipment Recommendations  None recommended by OT    Recommendations for Other Services       Precautions / Restrictions Precautions Precautions: Fall Restrictions Weight Bearing Restrictions: Yes RLE Weight Bearing: Weight bearing as tolerated      Mobility Bed Mobility               General bed mobility comments: deferred up in recliner  Transfers Overall transfer level: Independent Equipment used: Rolling walker (2 wheeled)             General transfer comment: good safety awareness    Balance Overall balance assessment: Independent                                         ADL either performed or assessed with clinical judgement   ADL Overall ADL's : Needs assistance/impaired Eating/Feeding: Sitting;Independent   Grooming: Supervision/safety;Standing   Upper Body Bathing: Set up;Sitting   Lower Body Bathing: Sit to/from stand;Minimal assistance Lower Body Bathing Details (indicate cue type and reason): pt educated in benefits of seated shower once  cleared to shower Upper Body Dressing : Set up;Sitting   Lower Body Dressing: Minimal assistance;Sit to/from stand Lower Body Dressing Details (indicate cue type and reason): pt educated in AE for LB dressing, has reacher and sock aid at home; pt verbalized understanding of visual demonstration Toilet Transfer: RW;Min guard;Ambulation;Comfort height toilet   Toileting- Clothing Manipulation and Hygiene: Independent       Functional mobility during ADLs: Min guard;Rolling walker       Vision Baseline Vision/History: Wears glasses Wears Glasses: At all times Patient Visual Report: No change from baseline       Perception     Praxis      Pertinent Vitals/Pain Pain Assessment: 0-10 Pain Score: 4  Pain Location: R knee Pain Intervention(s): Limited activity within patient's tolerance;Monitored during session;Ice applied     Hand Dominance Right   Extremity/Trunk Assessment Upper Extremity Assessment Upper Extremity Assessment: Overall WFL for tasks assessed   Lower Extremity Assessment Lower Extremity Assessment: Defer to PT evaluation;Overall [facility] for tasks assessed   Cervical / Trunk Assessment Cervical / Trunk Assessment: Normal   Communication Communication Communication: No difficulties   Cognition Arousal/Alertness: Awake/alert Behavior During Therapy: WFL for tasks assessed/performed Overall Cognitive Status: Within Functional Limits for tasks assessed  General Comments       Exercises Other Exercises Other Exercises: pt/spouse educated in compression stocking mgt, polar care mgt, AE/DME, and falls prevention strategies to [facility] safety and functional independence. Pt/spouse verbalized understanding.    Shoulder Instructions      Home Living Family/patient expects to be discharged to:: Private residence Living Arrangements: Spouse/significant other Available Help at Discharge: Family;Available 24  hours/day Type of Home: House Home Access: Stairs to enter [employer] of Steps: 3 Entrance Stairs-Rails: Right;Left Home Layout: One level     Bathroom Shower/Tub: Walk-in shower;Door   Bathroom Toilet: Handicapped height     Home Equipment: Environmental consultant - 2 wheels;Cane - single point;Bedside commode;Shower seat - built in;Grab bars - tub/shower;Hand held shower head          Prior Functioning/Environment Level of Independence: Independent        Comments: Independent with ADLs/IADLs, Active, Driving, and manging own medication.        OT Problem List:        OT Treatment/Interventions:      OT Goals(Current goals can be found in the care plan section) Acute Rehab OT Goals Patient Stated Goal: go home and get back to PLOF OT Goal Formulation: All assessment and education complete, DC therapy  OT Frequency:     Barriers to D/C:            Co-evaluation              AM-PAC PT "6 Clicks" Daily Activity     Outcome Measure Help from another person eating meals?: None Help from another person taking care of personal grooming?: None Help from another person toileting, which includes using toliet, bedpan, or urinal?: A Little Help from another person bathing (including washing, rinsing, drying)?: A Little Help from another person to put on and taking off regular upper body clothing?: None Help from another person to put on and taking off regular lower body clothing?: A Little 6 Click Score: 21   End of Session    Activity Tolerance: Patient tolerated treatment well Patient left: in chair;with call bell/phone within reach;with chair alarm set;with family/visitor present;Other (comment)(polar care in place)  OT Visit Diagnosis: Other abnormalities of gait and mobility (R26.89)                Time: 3790-2409 OT Time Calculation (min): 13 min Charges:  OT General Charges $OT Visit: 1 Visit OT Evaluation $OT Eval Low Complexity: 1 Low OT  Treatments $Self Care/Home Management : 8-22 mins G-Codes: OT G-codes **NOT FOR INPATIENT CLASS** Functional Assessment Tool Used: AM-PAC 6 Clicks Daily Activity;Clinical judgement Functional Limitation: Self care Self Care Current Status (B3532): At least 1 percent but less than 20 percent impaired, limited or restricted Self Care Goal Status (D9242): At least 1 percent but less than 20 percent impaired, limited or restricted Self Care Discharge Status [phone]): At least 1 percent but less than 20 percent impaired, limited or restricted   Jeni Salles, MPH, MS, OTR/L ascom [phone] 03/16/17, 1:07 PM

## 2017-03-16 NOTE — Anesthesia Postprocedure Evaluation (Signed)
Anesthesia Post Note  Patient: [name]  Procedure(s) Performed: COMPUTER ASSISTED TOTAL KNEE ARTHROPLASTY (Right Knee)  Patient location during evaluation: Nursing Unit Anesthesia Type: Spinal Level of consciousness: oriented and awake and alert Pain management: pain level controlled Vital Signs Assessment: post-procedure vital signs reviewed and stable Respiratory status: spontaneous breathing and respiratory function stable Cardiovascular status: blood pressure returned to baseline and stable Postop Assessment: no headache, no backache and no apparent nausea or vomiting Anesthetic complications: no     Last Vitals:  Vitals:   03/16/17 0200 03/16/17 0400  BP: (!) 94/54 (!) 93/56  Pulse: (!) 50 (!) 46  Resp:  17  Temp:  36.5 C  SpO2:  95%    Last Pain:  Vitals:   03/16/17 0643  TempSrc:   PainSc: 4                  Darlyne Russian

## 2017-03-16 NOTE — NC FL2 (Signed)
[place] LEVEL OF CARE SCREENING TOOL     IDENTIFICATION  Patient Name: Mary Elliott Birthdate: 05/10/1940 Sex: female Admission Date (Current Location): 03/15/2017  [place] and [place] Number:  Engineering geologist and Address:  [facility], [address], [place], [place] [address]      Provider Number: [id number]  Attending Physician Name and Address:  Dereck Leep, MD  Relative Name and Phone Number:       Current Level of Care: Hospital Recommended Level of Care: [place] Prior Approval Number:    Date Approved/Denied:   PASRR Number: ([id number])  Discharge Plan: SNF    Current Diagnoses: Patient Active Problem List   Diagnosis Date Noted  . S/P total knee arthroplasty 11/14/2016    Orientation RESPIRATION BLADDER Height & Weight     Self, Time, Situation, Place  Normal Continent Weight: 165 lb (74.8 kg) Height:  5\' 2"  (157.5 cm)  BEHAVIORAL SYMPTOMS/MOOD NEUROLOGICAL BOWEL NUTRITION STATUS      Continent Diet(Regular)  AMBULATORY STATUS COMMUNICATION OF NEEDS Skin   Extensive Assist Verbally Surgical wounds(Incision Right Knee)                       Personal Care Assistance Level of Assistance  Bathing, Feeding, Dressing Bathing Assistance: Limited assistance Feeding assistance: Independent Dressing Assistance: Limited assistance     Functional Limitations Info  Sight, Hearing, Speech Sight Info: Adequate Hearing Info: Impaired Speech Info: Adequate    SPECIAL CARE FACTORS FREQUENCY  PT (By licensed PT), OT (By licensed OT)     PT Frequency: (5) OT Frequency: (5)            Contractures      Additional Factors Info  Code Status, Allergies Code Status Info: (Full Code) Allergies Info: (CLINDAMYCIN/LINCOMYCIN, NITROFURANTOIN, ULTRAM TRAMADOL, VESICARE SOLIFENACIN SUCCINATE, DULOXETINE, NSAIDS, OXYBUTYNIN, SOLIFENACIN, SULFA ANTIBIOTICS, TEGASEROD )            Current Medications (03/16/2017):  This is the current hospital active medication list Current Facility-Administered Medications  Medication Dose Route Frequency Provider Last Rate Last Dose  . 0.9 %  sodium chloride infusion   Intravenous Continuous Dereck Leep, MD 100 mL/hr at 03/15/17 2213    . acetaminophen (OFIRMEV) IV 1,000 mg  1,000 mg Intravenous Q6H Hooten, Laurice Record, MD   Stopped at 03/16/17 0246  . acetaminophen (TYLENOL) tablet 650 mg  650 mg Oral Q4H PRN Hooten, Laurice Record, MD       Or  . acetaminophen (TYLENOL) suppository 650 mg  650 mg Rectal Q4H PRN Hooten, Laurice Record, MD      . acidophilus (RISAQUAD) capsule 1 capsule  1 capsule Oral q morning - 10a Hooten, Laurice Record, MD      . alum & mag hydroxide-simeth (MAALOX/MYLANTA) 200-200-20 MG/5ML suspension 30 mL  30 mL Oral Q4H PRN Hooten, Laurice Record, MD      . bisacodyl (DULCOLAX) suppository 10 mg  10 mg Rectal Daily PRN Hooten, Laurice Record, MD      . ceFAZolin (ANCEF) 2 g in dextrose 5 % 100 mL IVPB  2 g Intravenous Q6H Hooten, Laurice Record, MD   Stopped at 03/16/17 0516  . cholecalciferol (VITAMIN D) tablet 2,000 Units  2,000 Units Oral Daily Dereck Leep, MD   2,000 Units at 03/15/17 2216  . diphenhydrAMINE (BENADRYL) 12.5 MG/5ML elixir 12.5-25 mg  12.5-25 mg Oral Q4H PRN Hooten, Laurice Record, MD      .  enoxaparin (LOVENOX) injection 30 mg  30 mg Subcutaneous Q12H Hooten, Laurice Record, MD      . ferrous sulfate tablet 325 mg  325 mg Oral BID WC Hooten, Laurice Record, MD      . levothyroxine (SYNTHROID, LEVOTHROID) tablet 100 mcg  100 mcg Oral QAC breakfast Hooten, Laurice Record, MD      . loratadine (CLARITIN) tablet 10 mg  10 mg Oral Daily Hooten, Laurice Record, MD      . magnesium hydroxide (MILK OF MAGNESIA) suspension 30 mL  30 mL Oral Daily PRN Hooten, Laurice Record, MD      . menthol-cetylpyridinium (CEPACOL) lozenge 3 mg  1 lozenge Oral PRN Hooten, Laurice Record, MD       Or  . phenol (CHLORASEPTIC) mouth spray 1 spray  1 spray Mouth/Throat PRN Hooten, Laurice Record, MD      .  metoCLOPramide (REGLAN) tablet 10 mg  10 mg Oral TID AC & HS Hooten, Laurice Record, MD   10 mg at 03/15/17 2216  . metoprolol succinate (TOPROL-XL) 24 hr tablet 100 mg  100 mg Oral Daily Hooten, Laurice Record, MD      . morphine 2 MG/ML injection 2 mg  2 mg Intravenous Q2H PRN Hooten, Laurice Record, MD      . ondansetron (ZOFRAN) tablet 4 mg  4 mg Oral Q6H PRN Hooten, Laurice Record, MD       Or  . ondansetron (ZOFRAN) injection 4 mg  4 mg Intravenous Q6H PRN Hooten, Laurice Record, MD      . oxyCODONE (Oxy IR/ROXICODONE) immediate release tablet 10 mg  10 mg Oral Q3H PRN Hooten, Laurice Record, MD      . oxyCODONE (Oxy IR/ROXICODONE) immediate release tablet 5 mg  5 mg Oral Q3H PRN Dereck Leep, MD   5 mg at 03/16/17 0643  . pantoprazole (PROTONIX) EC tablet 40 mg  40 mg Oral BID Dereck Leep, MD   40 mg at 03/15/17 2216  . senna-docusate (Senokot-S) tablet 1 tablet  1 tablet Oral BID Dereck Leep, MD   1 tablet at 03/15/17 2216  . sertraline (ZOLOFT) tablet 75 mg  75 mg Oral Daily Hooten, Laurice Record, MD      . sodium phosphate (FLEET) 7-19 GM/118ML enema 1 enema  1 enema Rectal Once PRN Hooten, Laurice Record, MD      . vitamin B-12 (CYANOCOBALAMIN) tablet 1,000 mcg  1,000 mcg Oral Daily Hooten, Laurice Record, MD   1,000 mcg at 03/15/17 2216     Discharge Medications: Please see discharge summary for a list of discharge medications.  Relevant Imaging Results:  Relevant Lab Results:   Additional Information (SSN: [id number])  Smith Mince, Student-Social Work

## 2017-03-16 NOTE — Discharge Summary (Signed)
Physician Discharge Summary  Patient ID: Mary Elliott MRN: [id number] DOB/AGE: 06/24/1940 76 y.o.  Admit date: 03/15/2017 Discharge date: 03/17/2017  Admission Diagnoses:  primary osteoarthritis of right knee   Discharge Diagnoses: Patient Active Problem List   Diagnosis Date Noted  . S/P total knee arthroplasty 11/14/2016    Past Medical History:  Diagnosis Date  . Anxiety   . Cancer ([place]) 1999   basil cell on face  . GERD (gastroesophageal reflux disease)    history of  . History of kidney stones    twice  . HOH (hard of hearing)   . Hypertension   . Hypothyroidism   . PONV (postoperative nausea and vomiting)    nausea only with 2 prior surgeries  . Tinnitus      Transfusion: No transfusions during this admission   Consultants (if any):   Discharged Condition: Improved  Hospital Course: Mary Elliott is an 76 y.o. female who was admitted 03/15/2017 with a diagnosis of degenerative arthrosis right knee and went to the operating room on 03/15/2017 and underwent the above named procedures.    Surgeries:Procedure(s): COMPUTER ASSISTED TOTAL KNEE ARTHROPLASTY on 03/15/2017   PRE-OPERATIVE DIAGNOSIS: Degenerative arthrosis of the right knee, primary  POST-OPERATIVE DIAGNOSIS:  Same  PROCEDURE:  Right total knee arthroplasty using computer-assisted navigation  SURGEON:  Marciano Sequin. M.D.  ASSISTANT:  Vance Peper, PA (present and scrubbed throughout the case, critical for assistance with exposure, retraction, instrumentation, and closure)  ANESTHESIA: spinal  ESTIMATED BLOOD LOSS: 100 mL  FLUIDS REPLACED: 1300 mL of crystalloid  TOURNIQUET TIME: 94 minutes  DRAINS: 2 medium Hemovac drains  SOFT TISSUE RELEASES: Anterior cruciate ligament, posterior cruciate ligament, deep medial collateral ligament, patellofemoral ligament  IMPLANTS UTILIZED: DePuy Attune size 6N posterior stabilized femoral component (cemented), size 5 rotating  platform tibial component (cemented), 35 mm medialized dome patella (cemented), and a 5 mm stabilized rotating platform polyethylene insert.  INDICATIONS FOR SURGERY: Mary Elliott is a 76 y.o. year old female with a long history of progressive knee pain. X-rays demonstrated severe degenerative changes in tricompartmental fashion. The patient had not seen any significant improvement despite conservative nonsurgical intervention. After discussion of the risks and benefits of surgical intervention, the patient expressed understanding of the risks benefits and agree with plans for total knee arthroplasty.   The risks, benefits, and alternatives were discussed at length including but not limited to the risks of infection, bleeding, nerve injury, stiffness, blood clots, the need for revision surgery, cardiopulmonary complications, among others, and they were willing to proceed.    Patient tolerated the surgery well. No complications .Patient was taken to PACU where she was stabilized and then transferred to the orthopedic floor.  Patient started on Lovenox 30 mg q 12 hrs. Foot pumps applied bilaterally at 80 mm hgb. Heels elevated off bed with rolled towels. No evidence of DVT. Calves non tender. Negative Homan. Physical therapy started on day #1 for gait training and transfer with OT starting on  day #1 for ADL and assisted devices. Patient has done well with therapy. Ambulated greater than 200 feet upon being discharged. Was able to ascend and descend 4 steps safely and independently  Patient's IV And Foley were discontinued on day #1 with Hemovac being discontinued on day #2. Dressing was changed on day 2 prior to patient being discharged   She was given perioperative antibiotics:  Anti-infectives (From admission, onward)   Start     Dose/Rate Route Frequency Ordered Stop  03/15/17 2145  ceFAZolin (ANCEF) 2 g in dextrose 5 % 100 mL IVPB     2 g 200 mL/hr over 30 Minutes Intravenous Every 6  hours 03/15/17 2138 03/16/17 2144   03/15/17 2130  ceFAZolin (ANCEF) IVPB 2g/100 mL premix  Status:  Discontinued     2 g 200 mL/hr over 30 Minutes Intravenous Every 6 hours 03/15/17 2127 03/15/17 2138   03/15/17 1409  ceFAZolin (ANCEF) 2-4 GM/100ML-% IVPB    Comments:  Dewayne Hatch   : cabinet override      03/15/17 1409 03/16/17 0214   03/15/17 0600  ceFAZolin (ANCEF) IVPB 2g/100 mL premix  Status:  Discontinued     2 g 200 mL/hr over 30 Minutes Intravenous On call to O.R. 03/14/17 2223 03/15/17 1356    .  She was fitted with AV 1 compression foot pump devices, instructed on heel pumps, early ambulation, and fitted with TED stockings bilaterally for DVT prophylaxis.  She benefited maximally from the hospital stay and there were no complications.    Recent vital signs:  Vitals:   03/16/17 0200 03/16/17 0400  BP: (!) 94/54 (!) 93/56  Pulse: (!) 50 (!) 46  Resp:  17  Temp:  97.7 F (36.5 C)  SpO2:  95%    Recent laboratory studies:  Lab Results  Component Value Date   HGB 12.6 03/16/2017   HGB 13.4 03/02/2017   HGB 11.6 (L) 11/16/2016   Lab Results  Component Value Date   WBC 7.5 03/16/2017   PLT 250 03/16/2017   Lab Results  Component Value Date   INR 0.97 03/02/2017   Lab Results  Component Value Date   NA 137 03/16/2017   K 3.9 03/16/2017   CL 105 03/16/2017   CO2 25 03/16/2017   BUN 15 03/16/2017   CREATININE 0.68 03/16/2017   GLUCOSE 150 (H) 03/16/2017    Discharge Medications:   Allergies as of 03/16/2017      Reactions   Clindamycin/lincomycin Hives   Nitrofurantoin Hives   Ultram [tramadol] Itching   Vesicare [solifenacin Succinate] Other (See Comments)   headache   Duloxetine Other (See Comments)   Unable to empty bladder At a high dose of med   Nsaids Other (See Comments)   Prior HX of ulcers   Oxybutynin Hives, Rash, Other (See Comments)   The patches cause a reaction.   Solifenacin Other (See Comments)   Unknown   Sulfa Antibiotics  Other (See Comments), Diarrhea   GI UPSET Felt cloudy and couldn't move   Tegaserod Other (See Comments)   Joint pain      Medication List    TAKE these medications   ALIGN 4 MG Caps Take 4 mg by mouth daily.   cyanocobalamin 1000 MCG tablet Take 1,000 mcg by mouth daily.   enoxaparin 40 MG/0.4ML injection Commonly known as:  LOVENOX Inject 0.4 mLs (40 mg total) daily into the skin.   Glucosamine-Chondroitin 500-400 MG Caps Take 2 capsules by mouth daily.   levothyroxine 100 MCG tablet Commonly known as:  SYNTHROID, LEVOTHROID Take 100 mcg by mouth daily before breakfast.   loratadine 10 MG tablet Commonly known as:  CLARITIN Take 10 mg by mouth daily.   metoprolol succinate 100 MG 24 hr tablet Commonly known as:  TOPROL-XL Take 100 mg by mouth daily. In am   oxyCODONE 5 MG immediate release tablet Commonly known as:  Oxy IR/ROXICODONE Take 1 tablet (5 mg total) every 3 (three) hours as needed  by mouth for moderate pain ((score 4 to 6)).   PROBIOTIC-PREBIOTIC PO Take 1 capsule by mouth every morning.   sertraline 50 MG tablet Commonly known as:  ZOLOFT Take 75 mg by mouth daily.   Vitamin D3 2000 units capsule Take 2,000 Units by mouth daily.            Durable Medical Equipment  (From admission, onward)        Start     Ordered   03/15/17 2128  DME Walker rolling  Once    Question:  Patient needs a walker to treat with the following condition  Answer:  Total knee replacement status   03/15/17 2127   03/15/17 2128  DME Bedside commode  Once    Question:  Patient needs a bedside commode to treat with the following condition  Answer:  Total knee replacement status   03/15/17 2127      Diagnostic Studies: Dg Knee Right Port  Result Date: 03/15/2017 CLINICAL DATA:  Right total knee arthroplasty. EXAM: PORTABLE RIGHT KNEE - 1-2 VIEW COMPARISON:  None FINDINGS: Right total knee arthroplasty identified without complicating features. Surgical changes and  drain noted. IMPRESSION: Right total knee arthroplasty without complicating features. Electronically Signed   By: Margarette Canada M.D.   On: 03/15/2017 19:24    Disposition: 01-Home or Self Care  Discharge Instructions    Diet - low sodium heart healthy   Complete by:  As directed    Increase activity slowly   Complete by:  As directed       Follow-up Information    Duanne Guess, PA-C On 03/29/2017.   Specialties:  Orthopedic Surgery, Emergency Medicine Why:  at 2:00pm Contact information: [place] [address] [phone]        Dereck Leep, MD On 04/25/2017.   Specialty:  Orthopedic Surgery Why:  at 10:45am Contact information: [place] [address] [phone]            Signed: Watt Climes 03/16/2017, 7:13 AM

## 2017-03-17 LAB — BASIC METABOLIC PANEL
Anion gap: 7 (ref 5–15)
BUN: 17 mg/dL (ref 6–20)
CO2: 24 mmol/L (ref 22–32)
Calcium: 8.4 mg/dL — ABNORMAL LOW (ref 8.9–10.3)
Chloride: 104 mmol/L (ref 101–111)
Creatinine, Ser: 0.86 mg/dL (ref 0.44–1.00)
GFR calc non Af Amer: >60 mL/min (ref >60)
GLUCOSE: 100 mg/dL — AB (ref 65–99)
Potassium: 3.4 mmol/L — ABNORMAL LOW (ref 3.5–5.1)
Sodium: 135 mmol/L (ref 135–145)

## 2017-03-17 LAB — CBC
HEMATOCRIT: 32.6 % — AB (ref 35.0–47.0)
Hemoglobin: 11 g/dL — ABNORMAL LOW (ref 12.0–16.0)
MCH: 30.3 pg (ref 26.0–34.0)
MCHC: 33.6 g/dL (ref 32.0–36.0)
MCV: 90.3 fL (ref 80.0–100.0)
Platelets: 231 10*3/uL (ref 150–440)
RBC: 3.62 MIL/uL — ABNORMAL LOW (ref 3.80–5.20)
RDW: 14.4 % (ref 11.5–14.5)
WBC: 7.5 10*3/uL (ref 3.6–11.0)

## 2017-03-17 MED ORDER — POTASSIUM CHLORIDE 20 MEQ PO PACK
40.0000 meq | PACK | Freq: Once | ORAL | Status: AC
  Administered 2017-03-17: 40 meq via ORAL
  Filled 2017-03-17: qty 2

## 2017-03-17 NOTE — Care Management (Signed)
Spoke with patient and she is waiting on BM and having some pain but anticipates returning home today with her husband. I have notified Sonia Side and Octavia Bruckner with [facility].  No other RNCM needs.

## 2017-03-17 NOTE — Progress Notes (Signed)
Physical Therapy Treatment Patient Details Name: Mary Elliott MRN: [id number] DOB: October 10, 1940 Today's Date: 03/17/2017    History of Present Illness 76 y/o female s/p R total knee replacement 11/7.  She had her L knee replaced a few months ago this summer.    PT Comments    Pt is able to ambulate fully around the nurses' station and negotiate up/down steps with some initial hesitancy but ultimately did very well and showed good safety and function.  She was stiff this AM but with reps and encouragement made good gains with ROM (0-96) SLRs (initially couldn't do actively, ultimately did a few reps w/o assist).  By the end of the prolonged bout of ambulation she was walking with very good speed and cadence w/o heavy reliance on the walker. Pt safe to return home once medically cleared.   Follow Up Recommendations  Home health PT     Equipment Recommendations       Recommendations for Other Services Rehab consult     Precautions / Restrictions Precautions Precautions: Fall Restrictions RLE Weight Bearing: Weight bearing as tolerated    Mobility  Bed Mobility Overal bed mobility: Modified Independent             General bed mobility comments: Pt able to get to EOB w/o assist.   Transfers Overall transfer level: Independent Equipment used: Rolling walker (2 wheeled)                Ambulation/Gait Ambulation/Gait assistance: Supervision Ambulation Distance (Feet): 300 Feet Assistive device: Rolling walker (2 wheeled)       General Gait Details: Per her normal pt started out with slow, hesitant steps, but after warming up and getting more comfortable she was able to maintain consistent speed/cadence with decreased UE use.    Stairs Stairs: Yes   Stair Management: One rail Right Number of Stairs: 8 General stair comments: Pt initially hesitant on steps but, as with ambulation, after warming up she improved  Wheelchair Mobility    Modified [name]  (Stroke Patients Only)       Balance Overall balance assessment: Independent                                          Cognition Arousal/Alertness: Awake/alert Behavior During Therapy: WFL for tasks assessed/performed Overall Cognitive Status: Within Functional Limits for tasks assessed                                        Exercises Total Joint Exercises Ankle Circles/Pumps: AROM;10 reps Quad Sets: Strengthening;10 reps Gluteal Sets: Strengthening;10 reps Short Arc Quad: Strengthening;10 reps Heel Slides: Strengthening;10 reps Hip ABduction/ADduction: Strengthening;10 reps Straight Leg Raises: AAROM;AROM;10 reps Knee Flexion: PROM;5 reps Goniometric ROM: 0-96    General Comments        Pertinent Vitals/Pain Pain Score: 7     Home Living                      Prior Function            PT Goals (current goals can now be found in the care plan section) Progress towards PT goals: Progressing toward goals    Frequency    BID      PT Plan Current plan remains appropriate  Co-evaluation              AM-PAC PT "6 Clicks" Daily Activity  Outcome Measure  Difficulty turning over in bed (including adjusting bedclothes, sheets and blankets)?: None Difficulty moving from lying on back to sitting on the side of the bed? : None Difficulty sitting down on and standing up from a chair with arms (e.g., wheelchair, bedside commode, etc,.)?: None Help needed moving to and from a bed to chair (including a wheelchair)?: None Help needed walking in hospital room?: None Help needed climbing 3-5 steps with a railing? : None 6 Click Score: 24    End of Session Equipment Utilized During Treatment: Gait belt Activity Tolerance: Patient tolerated treatment well Patient left: with call bell/phone within reach;with family/visitor present;with bed alarm set   PT Visit Diagnosis: Muscle weakness (generalized) (M62.81);Difficulty  in walking, not elsewhere classified (R26.2)     Time: 3903-0092 PT Time Calculation (min) (ACUTE ONLY): 40 min  Charges:  $Gait Training: 8-22 mins $Therapeutic Exercise: 23-37 mins                    G Codes:       Kreg Shropshire, DPT 03/17/2017, 1:23 PM

## 2017-03-17 NOTE — Progress Notes (Signed)
   Subjective: 2 Days Post-Op Procedure(s) (LRB): COMPUTER ASSISTED TOTAL KNEE ARTHROPLASTY (Right) Patient reports pain as moderate.   Patient is well, and has had no acute complaints or problems Patient did very well with physical therapy yesterday Plan is to go Home after hospital stay. no nausea and no vomiting Patient denies any chest pains or shortness of breath. Objective: Vital signs in last 24 hours: Temp:  [97.6 F (36.4 C)-98 F (36.7 C)] 97.9 F (36.6 C) (11/08 2006) Pulse Rate:  [52-57] 57 (11/08 2006) Resp:  [16-18] 18 (11/08 2006) BP: (108-120)/(57-63) 113/58 (11/08 2006) SpO2:  [97 %-100 %] 97 % (11/08 2006) well approximated incision Heels are non tender and elevated off the bed using rolled towels Intake/Output from previous day: 11/08 0701 - 11/09 0700 In: 720 [P.O.:720] Out: 260 [Drains:260] Intake/Output this shift: Total I/O In: -  Out: 110 [Drains:110]  Recent Labs    03/16/17 0347 03/17/17 0511  HGB 12.6 11.0*   Recent Labs    03/16/17 0347 03/17/17 0511  WBC 7.5 7.5  RBC 4.06 3.62*  HCT 36.7 32.6*  PLT 250 231   Recent Labs    03/16/17 0347 03/17/17 0511  NA 137 135  K 3.9 3.4*  CL 105 104  CO2 25 24  BUN 15 17  CREATININE 0.68 0.86  GLUCOSE 150* 100*  CALCIUM 8.6* 8.4*   No results for input(s): LABPT, INR in the last 72 hours.  EXAM General - Patient is Alert, Appropriate, Oriented and Seems to be a little bit on the slow side with cognitive effect this morning. Extremity - Neurologically intact Neurovascular intact Sensation intact distally Intact pulses distally Dorsiflexion/Plantar flexion intact No cellulitis present Compartment soft Dressing - scant drainage Motor Function - intact, moving foot and toes well on exam.    Past Medical History:  Diagnosis Date  . Anxiety   . Cancer ([place]) 1999   basil cell on face  . GERD (gastroesophageal reflux disease)    history of  . History of kidney stones    twice  .  HOH (hard of hearing)   . Hypertension   . Hypothyroidism   . PONV (postoperative nausea and vomiting)    nausea only with 2 prior surgeries  . Tinnitus     Assessment/Plan: 2 Days Post-Op Procedure(s) (LRB): COMPUTER ASSISTED TOTAL KNEE ARTHROPLASTY (Right) Active Problems:   S/P total knee arthroplasty  Estimated body mass index is 30.18 kg/m as calculated from the following:   Height as of this encounter: 5\' 2"  (1.575 m).   Weight as of this encounter: 74.8 kg (165 lb). Up with therapy Discharge home with home health  Labs: Were reviewed. Potassium 3.4. We'll add 1 dose of Klor-Con prior to being discharged DVT Prophylaxis - Lovenox, Foot Pumps and TED hose Weight-Bearing as tolerated to right leg Patient needs to have a bowel movement prior to being discharged Please change dressing to operative leg prior to discharge. Please wash operative leg and apply TED stockings to both legs. Be sure that the bone foam goes home with the patient.  Jillyn Ledger. [place] 03/17/2017, 6:39 AM

## 2017-03-17 NOTE — Progress Notes (Signed)
Pt is in no acute distress tonight. Pt wore bone foam throughout night and wishes to have a break this morning. Hemovac intact and charged. VSS. Family at bedside.

## 2017-03-17 NOTE — Progress Notes (Signed)
Discharge instructions and med details reviewed with patient and family. Printed prescription for oxycodone given to patient along with printed AVS. All questions answered, patient aware of prescription for Lovenox called in. IV removed. Patient was escorted out via wheelchair.

## 2017-03-19 DIAGNOSIS — Z9181 History of falling: Secondary | ICD-10-CM | POA: Diagnosis not present

## 2017-03-19 DIAGNOSIS — Z471 Aftercare following joint replacement surgery: Secondary | ICD-10-CM | POA: Diagnosis not present

## 2017-03-19 DIAGNOSIS — Z96653 Presence of artificial knee joint, bilateral: Secondary | ICD-10-CM | POA: Diagnosis not present

## 2017-03-27 DIAGNOSIS — Z96653 Presence of artificial knee joint, bilateral: Secondary | ICD-10-CM | POA: Diagnosis not present

## 2017-03-27 DIAGNOSIS — Z471 Aftercare following joint replacement surgery: Secondary | ICD-10-CM | POA: Diagnosis not present

## 2017-03-27 DIAGNOSIS — Z9181 History of falling: Secondary | ICD-10-CM | POA: Diagnosis not present

## 2017-03-29 DIAGNOSIS — Z96651 Presence of right artificial knee joint: Secondary | ICD-10-CM | POA: Diagnosis not present

## 2017-03-29 DIAGNOSIS — M25561 Pain in right knee: Secondary | ICD-10-CM | POA: Diagnosis not present

## 2017-04-03 DIAGNOSIS — Z96651 Presence of right artificial knee joint: Secondary | ICD-10-CM | POA: Diagnosis not present

## 2017-04-03 DIAGNOSIS — S61211A Laceration without foreign body of left index finger without damage to nail, initial encounter: Secondary | ICD-10-CM | POA: Diagnosis not present

## 2017-04-05 DIAGNOSIS — Z96651 Presence of right artificial knee joint: Secondary | ICD-10-CM | POA: Diagnosis not present

## 2017-04-07 DIAGNOSIS — Z96651 Presence of right artificial knee joint: Secondary | ICD-10-CM | POA: Diagnosis not present

## 2017-04-10 DIAGNOSIS — Z96651 Presence of right artificial knee joint: Secondary | ICD-10-CM | POA: Diagnosis not present

## 2017-04-12 DIAGNOSIS — Z96651 Presence of right artificial knee joint: Secondary | ICD-10-CM | POA: Diagnosis not present

## 2017-04-14 DIAGNOSIS — Z96651 Presence of right artificial knee joint: Secondary | ICD-10-CM | POA: Diagnosis not present

## 2017-04-19 DIAGNOSIS — Z96651 Presence of right artificial knee joint: Secondary | ICD-10-CM | POA: Diagnosis not present

## 2017-04-21 DIAGNOSIS — Z96651 Presence of right artificial knee joint: Secondary | ICD-10-CM | POA: Diagnosis not present

## 2017-04-24 DIAGNOSIS — M25561 Pain in right knee: Secondary | ICD-10-CM | POA: Diagnosis not present

## 2017-04-24 DIAGNOSIS — Z96651 Presence of right artificial knee joint: Secondary | ICD-10-CM | POA: Diagnosis not present

## 2017-04-25 DIAGNOSIS — Z96651 Presence of right artificial knee joint: Secondary | ICD-10-CM | POA: Diagnosis not present

## 2017-05-23 DIAGNOSIS — F411 Generalized anxiety disorder: Secondary | ICD-10-CM | POA: Diagnosis not present

## 2017-05-23 DIAGNOSIS — F3342 Major depressive disorder, recurrent, in full remission: Secondary | ICD-10-CM | POA: Diagnosis not present

## 2017-05-23 DIAGNOSIS — F410 Panic disorder [episodic paroxysmal anxiety] without agoraphobia: Secondary | ICD-10-CM | POA: Diagnosis not present

## 2017-06-19 DIAGNOSIS — Z471 Aftercare following joint replacement surgery: Secondary | ICD-10-CM | POA: Diagnosis not present

## 2017-06-21 DIAGNOSIS — R102 Pelvic and perineal pain: Secondary | ICD-10-CM | POA: Diagnosis not present

## 2017-06-21 DIAGNOSIS — R300 Dysuria: Secondary | ICD-10-CM | POA: Diagnosis not present

## 2017-06-26 DIAGNOSIS — K219 Gastro-esophageal reflux disease without esophagitis: Secondary | ICD-10-CM | POA: Diagnosis not present

## 2017-06-26 DIAGNOSIS — M170 Bilateral primary osteoarthritis of knee: Secondary | ICD-10-CM | POA: Diagnosis not present

## 2017-06-26 DIAGNOSIS — H2513 Age-related nuclear cataract, bilateral: Secondary | ICD-10-CM | POA: Diagnosis not present

## 2017-06-26 DIAGNOSIS — E034 Atrophy of thyroid (acquired): Secondary | ICD-10-CM | POA: Diagnosis not present

## 2017-06-26 DIAGNOSIS — E7800 Pure hypercholesterolemia, unspecified: Secondary | ICD-10-CM | POA: Diagnosis not present

## 2017-06-26 DIAGNOSIS — I10 Essential (primary) hypertension: Secondary | ICD-10-CM | POA: Diagnosis not present

## 2017-07-03 DIAGNOSIS — E7800 Pure hypercholesterolemia, unspecified: Secondary | ICD-10-CM | POA: Diagnosis not present

## 2017-07-03 DIAGNOSIS — R600 Localized edema: Secondary | ICD-10-CM | POA: Diagnosis not present

## 2017-07-03 DIAGNOSIS — F3341 Major depressive disorder, recurrent, in partial remission: Secondary | ICD-10-CM | POA: Diagnosis not present

## 2017-07-03 DIAGNOSIS — E034 Atrophy of thyroid (acquired): Secondary | ICD-10-CM | POA: Diagnosis not present

## 2017-07-03 DIAGNOSIS — R002 Palpitations: Secondary | ICD-10-CM | POA: Diagnosis not present

## 2017-07-03 DIAGNOSIS — K219 Gastro-esophageal reflux disease without esophagitis: Secondary | ICD-10-CM | POA: Diagnosis not present

## 2017-07-03 DIAGNOSIS — N3941 Urge incontinence: Secondary | ICD-10-CM | POA: Diagnosis not present

## 2017-07-03 DIAGNOSIS — Z780 Asymptomatic menopausal state: Secondary | ICD-10-CM | POA: Diagnosis not present

## 2017-07-03 DIAGNOSIS — I10 Essential (primary) hypertension: Secondary | ICD-10-CM | POA: Diagnosis not present

## 2017-07-10 DIAGNOSIS — Z780 Asymptomatic menopausal state: Secondary | ICD-10-CM | POA: Diagnosis not present

## 2017-07-10 DIAGNOSIS — M8588 Other specified disorders of bone density and structure, other site: Secondary | ICD-10-CM | POA: Diagnosis not present

## 2017-09-12 DIAGNOSIS — F410 Panic disorder [episodic paroxysmal anxiety] without agoraphobia: Secondary | ICD-10-CM | POA: Diagnosis not present

## 2017-09-12 DIAGNOSIS — F3342 Major depressive disorder, recurrent, in full remission: Secondary | ICD-10-CM | POA: Diagnosis not present

## 2017-09-12 DIAGNOSIS — Z96651 Presence of right artificial knee joint: Secondary | ICD-10-CM | POA: Diagnosis not present

## 2017-09-12 DIAGNOSIS — F411 Generalized anxiety disorder: Secondary | ICD-10-CM | POA: Diagnosis not present

## 2017-09-20 DIAGNOSIS — H903 Sensorineural hearing loss, bilateral: Secondary | ICD-10-CM | POA: Diagnosis not present

## 2017-12-14 DIAGNOSIS — Z96653 Presence of artificial knee joint, bilateral: Secondary | ICD-10-CM | POA: Diagnosis not present

## 2017-12-26 DIAGNOSIS — K219 Gastro-esophageal reflux disease without esophagitis: Secondary | ICD-10-CM | POA: Diagnosis not present

## 2017-12-26 DIAGNOSIS — I10 Essential (primary) hypertension: Secondary | ICD-10-CM | POA: Diagnosis not present

## 2017-12-26 DIAGNOSIS — E034 Atrophy of thyroid (acquired): Secondary | ICD-10-CM | POA: Diagnosis not present

## 2017-12-26 DIAGNOSIS — R600 Localized edema: Secondary | ICD-10-CM | POA: Diagnosis not present

## 2017-12-26 DIAGNOSIS — E7800 Pure hypercholesterolemia, unspecified: Secondary | ICD-10-CM | POA: Diagnosis not present

## 2018-01-02 DIAGNOSIS — K219 Gastro-esophageal reflux disease without esophagitis: Secondary | ICD-10-CM | POA: Diagnosis not present

## 2018-01-02 DIAGNOSIS — Z0000 Encounter for general adult medical examination without abnormal findings: Secondary | ICD-10-CM | POA: Diagnosis not present

## 2018-01-02 DIAGNOSIS — F3341 Major depressive disorder, recurrent, in partial remission: Secondary | ICD-10-CM | POA: Diagnosis not present

## 2018-01-02 DIAGNOSIS — R002 Palpitations: Secondary | ICD-10-CM | POA: Diagnosis not present

## 2018-01-02 DIAGNOSIS — E034 Atrophy of thyroid (acquired): Secondary | ICD-10-CM | POA: Diagnosis not present

## 2018-01-02 DIAGNOSIS — I10 Essential (primary) hypertension: Secondary | ICD-10-CM | POA: Diagnosis not present

## 2018-01-02 DIAGNOSIS — E7800 Pure hypercholesterolemia, unspecified: Secondary | ICD-10-CM | POA: Diagnosis not present

## 2018-01-09 DIAGNOSIS — F410 Panic disorder [episodic paroxysmal anxiety] without agoraphobia: Secondary | ICD-10-CM | POA: Diagnosis not present

## 2018-01-09 DIAGNOSIS — F3342 Major depressive disorder, recurrent, in full remission: Secondary | ICD-10-CM | POA: Diagnosis not present

## 2018-01-09 DIAGNOSIS — F411 Generalized anxiety disorder: Secondary | ICD-10-CM | POA: Diagnosis not present

## 2018-01-10 ENCOUNTER — Other Ambulatory Visit: Payer: Self-pay | Admitting: Internal Medicine

## 2018-01-10 DIAGNOSIS — Z1231 Encounter for screening mammogram for malignant neoplasm of breast: Secondary | ICD-10-CM

## 2018-02-20 ENCOUNTER — Ambulatory Visit
Admission: RE | Admit: 2018-02-20 | Discharge: 2018-02-20 | Disposition: A | Discharge status: Home / Self Care | Payer: PPO | Source: Ambulatory Visit | Attending: Internal Medicine | Admitting: Internal Medicine

## 2018-02-20 DIAGNOSIS — Z1231 Encounter for screening mammogram for malignant neoplasm of breast: Secondary | ICD-10-CM | POA: Insufficient documentation

## 2018-03-01 DIAGNOSIS — D2271 Melanocytic nevi of right lower limb, including hip: Secondary | ICD-10-CM | POA: Diagnosis not present

## 2018-03-01 DIAGNOSIS — Z85828 Personal history of other malignant neoplasm of skin: Secondary | ICD-10-CM | POA: Diagnosis not present

## 2018-03-01 DIAGNOSIS — L821 Other seborrheic keratosis: Secondary | ICD-10-CM | POA: Diagnosis not present

## 2018-03-01 DIAGNOSIS — D2272 Melanocytic nevi of left lower limb, including hip: Secondary | ICD-10-CM | POA: Diagnosis not present

## 2018-03-01 DIAGNOSIS — D2261 Melanocytic nevi of right upper limb, including shoulder: Secondary | ICD-10-CM | POA: Diagnosis not present

## 2018-03-01 DIAGNOSIS — D2262 Melanocytic nevi of left upper limb, including shoulder: Secondary | ICD-10-CM | POA: Diagnosis not present

## 2018-03-01 DIAGNOSIS — L718 Other rosacea: Secondary | ICD-10-CM | POA: Diagnosis not present

## 2018-03-01 DIAGNOSIS — Z08 Encounter for follow-up examination after completed treatment for malignant neoplasm: Secondary | ICD-10-CM | POA: Diagnosis not present

## 2018-03-05 DIAGNOSIS — T169XXA Foreign body in ear, unspecified ear, initial encounter: Secondary | ICD-10-CM | POA: Diagnosis not present

## 2018-03-05 DIAGNOSIS — H903 Sensorineural hearing loss, bilateral: Secondary | ICD-10-CM | POA: Diagnosis not present

## 2018-03-13 DIAGNOSIS — F3342 Major depressive disorder, recurrent, in full remission: Secondary | ICD-10-CM | POA: Diagnosis not present

## 2018-03-13 DIAGNOSIS — F411 Generalized anxiety disorder: Secondary | ICD-10-CM | POA: Diagnosis not present

## 2018-03-13 DIAGNOSIS — F410 Panic disorder [episodic paroxysmal anxiety] without agoraphobia: Secondary | ICD-10-CM | POA: Diagnosis not present

## 2018-03-29 IMAGING — DX DG KNEE 1-2V PORT*L*
2 series · 2 of 2 positions shown · non-contrast
Comparison: None.

CLINICAL DATA: Left total knee replacement.

EXAM:
PORTABLE LEFT KNEE - 1-2 VIEW

[knee ap]
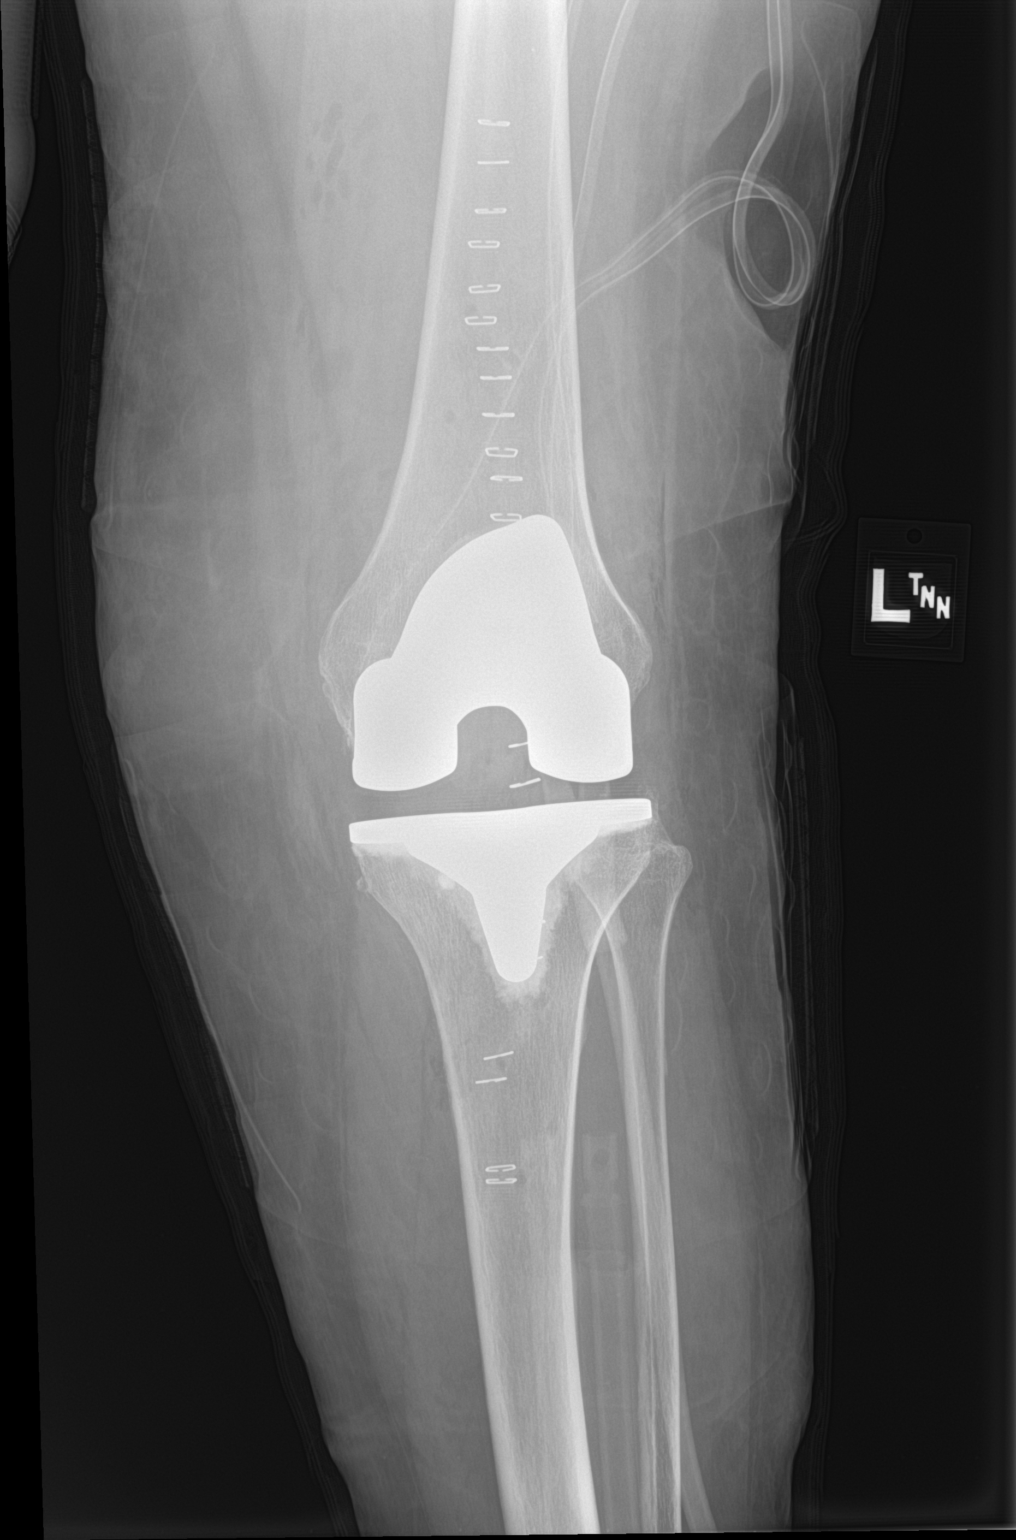

[knee lat]
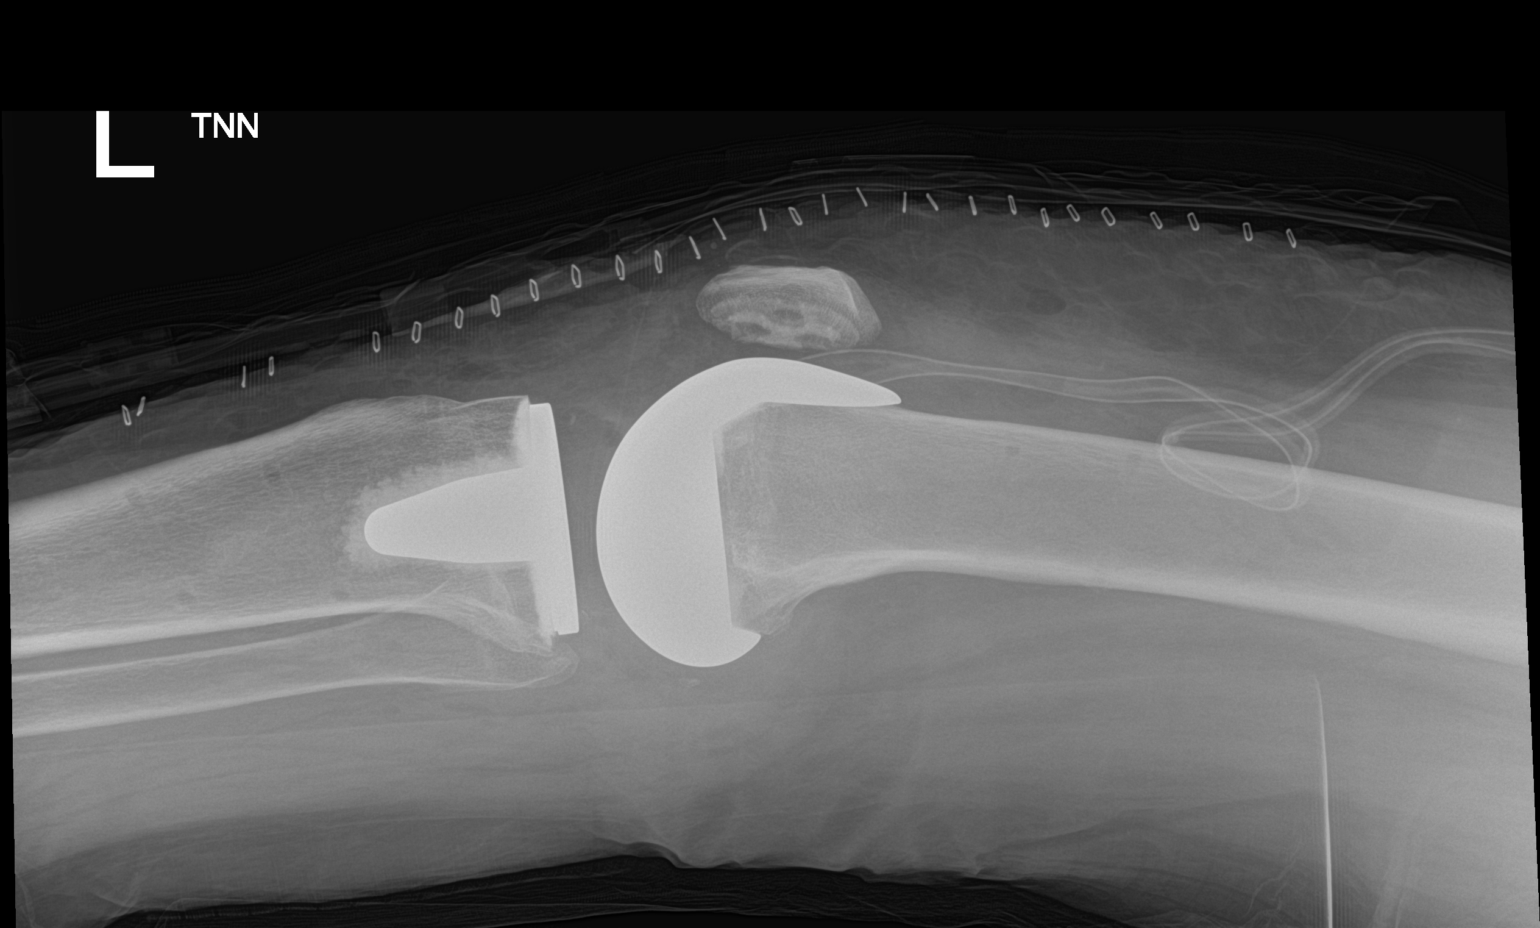

[2 of 2 positions shown; findings below may reference images not displayed]

FINDINGS: Left total knee arthroplasty. Surgical drains and skin staples are
in place. Associated joint air and fluid.
IMPRESSION: Left total knee arthroplasty with expected postoperative findings

## 2018-04-10 DIAGNOSIS — M26609 Unspecified temporomandibular joint disorder, unspecified side: Secondary | ICD-10-CM | POA: Diagnosis not present

## 2018-04-10 DIAGNOSIS — H9209 Otalgia, unspecified ear: Secondary | ICD-10-CM | POA: Diagnosis not present

## 2018-04-24 DIAGNOSIS — H6980 Other specified disorders of Eustachian tube, unspecified ear: Secondary | ICD-10-CM | POA: Diagnosis not present

## 2018-04-24 DIAGNOSIS — H9209 Otalgia, unspecified ear: Secondary | ICD-10-CM | POA: Diagnosis not present

## 2018-04-24 DIAGNOSIS — M26609 Unspecified temporomandibular joint disorder, unspecified side: Secondary | ICD-10-CM | POA: Diagnosis not present

## 2018-06-01 DIAGNOSIS — N3941 Urge incontinence: Secondary | ICD-10-CM | POA: Diagnosis not present

## 2018-06-01 DIAGNOSIS — N8111 Cystocele, midline: Secondary | ICD-10-CM | POA: Diagnosis not present

## 2018-06-04 DIAGNOSIS — N3941 Urge incontinence: Secondary | ICD-10-CM | POA: Diagnosis not present

## 2018-06-21 DIAGNOSIS — H2513 Age-related nuclear cataract, bilateral: Secondary | ICD-10-CM | POA: Diagnosis not present

## 2018-06-21 DIAGNOSIS — G43109 Migraine with aura, not intractable, without status migrainosus: Secondary | ICD-10-CM | POA: Diagnosis not present

## 2018-07-02 DIAGNOSIS — N3941 Urge incontinence: Secondary | ICD-10-CM | POA: Diagnosis not present

## 2018-07-03 DIAGNOSIS — E7800 Pure hypercholesterolemia, unspecified: Secondary | ICD-10-CM | POA: Diagnosis not present

## 2018-07-03 DIAGNOSIS — K219 Gastro-esophageal reflux disease without esophagitis: Secondary | ICD-10-CM | POA: Diagnosis not present

## 2018-07-03 DIAGNOSIS — I10 Essential (primary) hypertension: Secondary | ICD-10-CM | POA: Diagnosis not present

## 2018-07-09 DIAGNOSIS — E034 Atrophy of thyroid (acquired): Secondary | ICD-10-CM | POA: Diagnosis not present

## 2018-07-09 DIAGNOSIS — E7800 Pure hypercholesterolemia, unspecified: Secondary | ICD-10-CM | POA: Diagnosis not present

## 2018-07-09 DIAGNOSIS — I10 Essential (primary) hypertension: Secondary | ICD-10-CM | POA: Diagnosis not present

## 2018-07-09 DIAGNOSIS — F419 Anxiety disorder, unspecified: Secondary | ICD-10-CM | POA: Diagnosis not present

## 2018-07-09 DIAGNOSIS — F3341 Major depressive disorder, recurrent, in partial remission: Secondary | ICD-10-CM | POA: Diagnosis not present

## 2018-07-09 DIAGNOSIS — K219 Gastro-esophageal reflux disease without esophagitis: Secondary | ICD-10-CM | POA: Diagnosis not present

## 2018-07-09 DIAGNOSIS — R600 Localized edema: Secondary | ICD-10-CM | POA: Diagnosis not present

## 2018-07-18 DIAGNOSIS — F3342 Major depressive disorder, recurrent, in full remission: Secondary | ICD-10-CM | POA: Diagnosis not present

## 2018-07-18 DIAGNOSIS — F411 Generalized anxiety disorder: Secondary | ICD-10-CM | POA: Diagnosis not present

## 2018-07-18 DIAGNOSIS — F410 Panic disorder [episodic paroxysmal anxiety] without agoraphobia: Secondary | ICD-10-CM | POA: Diagnosis not present

## 2018-07-28 IMAGING — DX DG KNEE 1-2V PORT*R*
2 series · 2 of 2 positions shown · non-contrast
Comparison: None

CLINICAL DATA: Right total knee arthroplasty.

EXAM:
PORTABLE RIGHT KNEE - 1-2 VIEW

[knee ap]
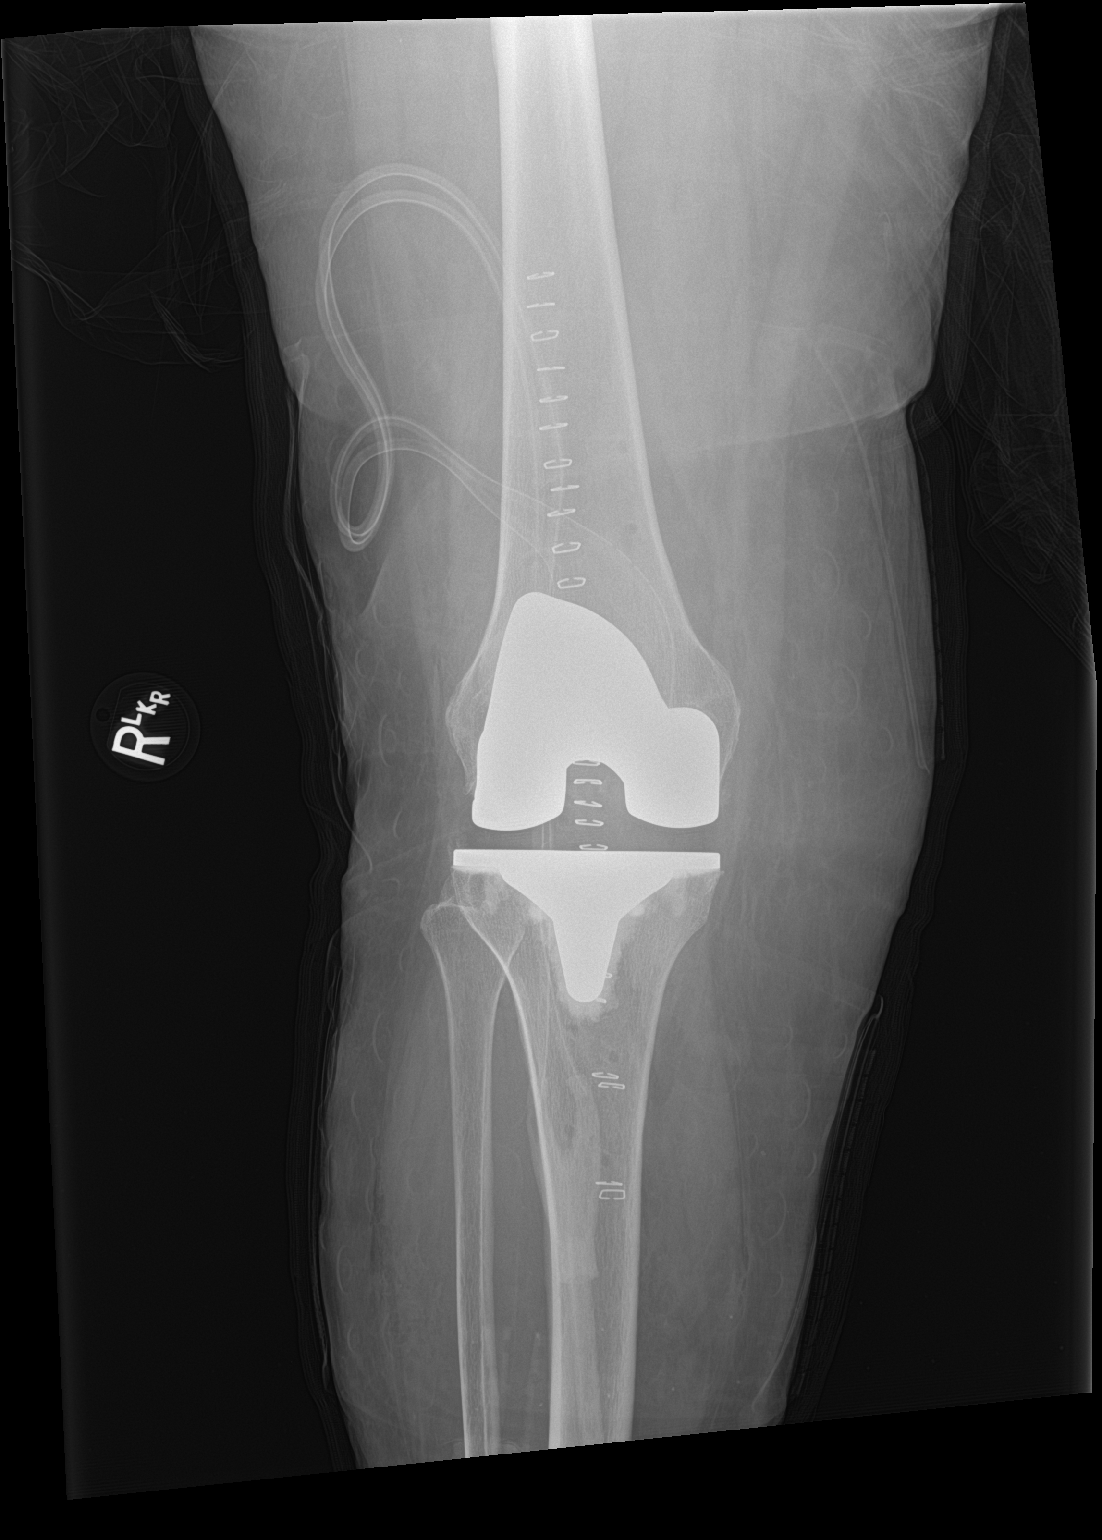

[knee lat]
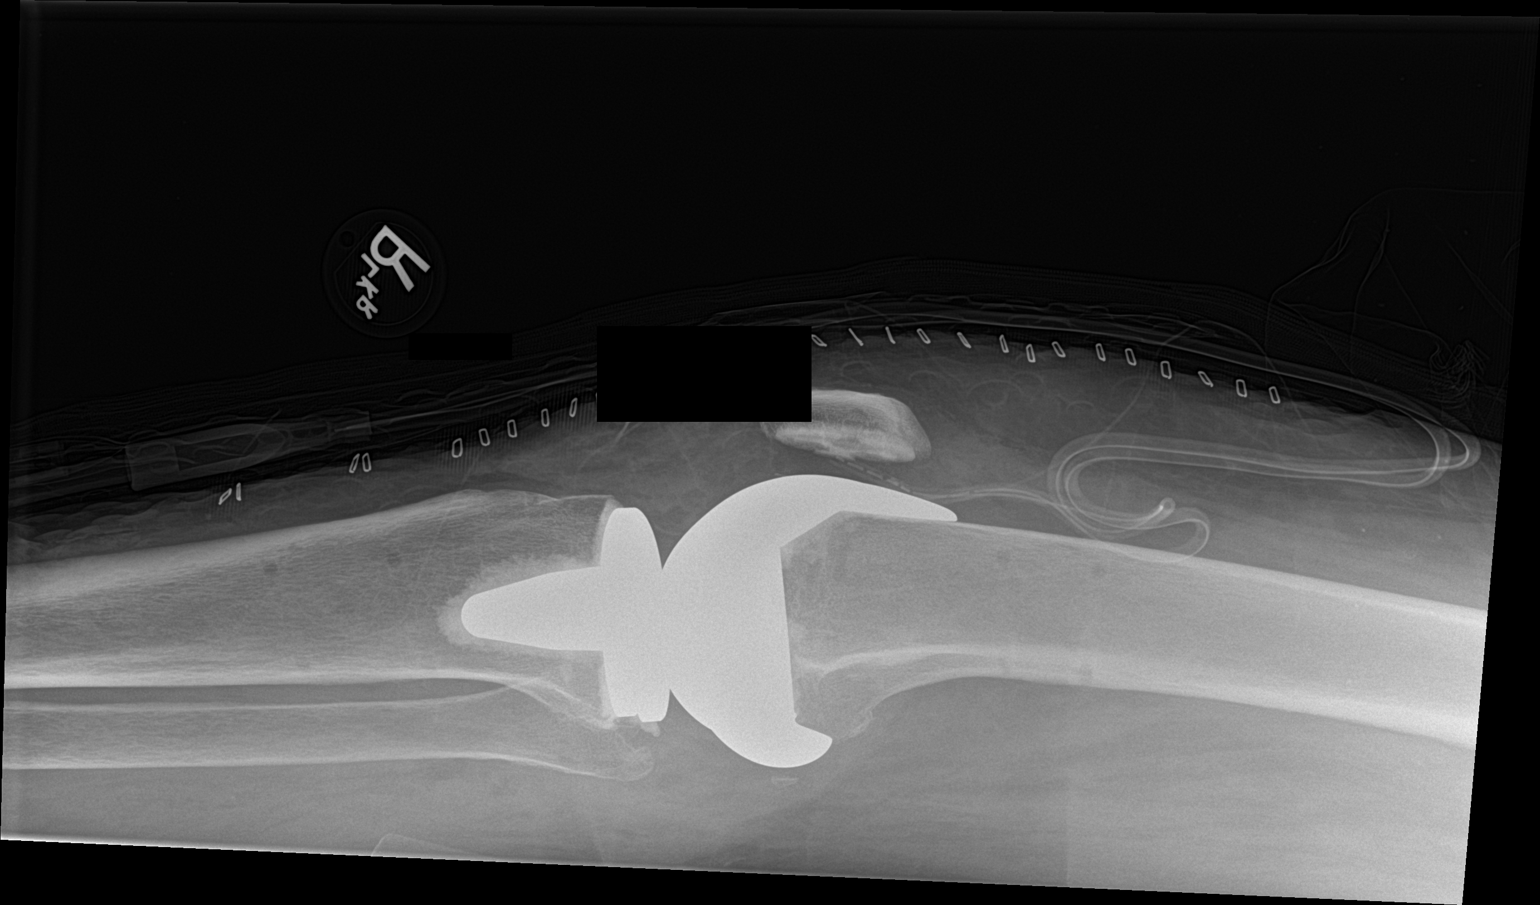

[2 of 2 positions shown; findings below may reference images not displayed]

FINDINGS: Right total knee arthroplasty identified without complicating
features.

Surgical changes and drain noted.
IMPRESSION: Right total knee arthroplasty without complicating features.

## 2018-09-05 DIAGNOSIS — H01003 Unspecified blepharitis right eye, unspecified eyelid: Secondary | ICD-10-CM | POA: Diagnosis not present

## 2018-11-13 DIAGNOSIS — F3342 Major depressive disorder, recurrent, in full remission: Secondary | ICD-10-CM | POA: Diagnosis not present

## 2018-11-13 DIAGNOSIS — F410 Panic disorder [episodic paroxysmal anxiety] without agoraphobia: Secondary | ICD-10-CM | POA: Diagnosis not present

## 2018-11-13 DIAGNOSIS — F411 Generalized anxiety disorder: Secondary | ICD-10-CM | POA: Diagnosis not present

## 2019-01-02 DIAGNOSIS — K219 Gastro-esophageal reflux disease without esophagitis: Secondary | ICD-10-CM | POA: Diagnosis not present

## 2019-01-02 DIAGNOSIS — E034 Atrophy of thyroid (acquired): Secondary | ICD-10-CM | POA: Diagnosis not present

## 2019-01-02 DIAGNOSIS — E7800 Pure hypercholesterolemia, unspecified: Secondary | ICD-10-CM | POA: Diagnosis not present

## 2019-01-02 DIAGNOSIS — I10 Essential (primary) hypertension: Secondary | ICD-10-CM | POA: Diagnosis not present

## 2019-01-02 DIAGNOSIS — R600 Localized edema: Secondary | ICD-10-CM | POA: Diagnosis not present

## 2019-01-09 DIAGNOSIS — E034 Atrophy of thyroid (acquired): Secondary | ICD-10-CM | POA: Diagnosis not present

## 2019-01-09 DIAGNOSIS — E7800 Pure hypercholesterolemia, unspecified: Secondary | ICD-10-CM | POA: Diagnosis not present

## 2019-01-09 DIAGNOSIS — R002 Palpitations: Secondary | ICD-10-CM | POA: Diagnosis not present

## 2019-01-09 DIAGNOSIS — Z0000 Encounter for general adult medical examination without abnormal findings: Secondary | ICD-10-CM | POA: Diagnosis not present

## 2019-01-09 DIAGNOSIS — K219 Gastro-esophageal reflux disease without esophagitis: Secondary | ICD-10-CM | POA: Diagnosis not present

## 2019-01-09 DIAGNOSIS — I739 Peripheral vascular disease, unspecified: Secondary | ICD-10-CM | POA: Diagnosis not present

## 2019-01-09 DIAGNOSIS — F3341 Major depressive disorder, recurrent, in partial remission: Secondary | ICD-10-CM | POA: Diagnosis not present

## 2019-01-09 DIAGNOSIS — H903 Sensorineural hearing loss, bilateral: Secondary | ICD-10-CM | POA: Diagnosis not present

## 2019-01-09 DIAGNOSIS — Z1159 Encounter for screening for other viral diseases: Secondary | ICD-10-CM | POA: Diagnosis not present

## 2019-01-09 DIAGNOSIS — I10 Essential (primary) hypertension: Secondary | ICD-10-CM | POA: Diagnosis not present

## 2019-01-15 ENCOUNTER — Other Ambulatory Visit: Payer: Self-pay | Admitting: Internal Medicine

## 2019-01-15 DIAGNOSIS — Z1231 Encounter for screening mammogram for malignant neoplasm of breast: Secondary | ICD-10-CM

## 2019-01-16 DIAGNOSIS — I739 Peripheral vascular disease, unspecified: Secondary | ICD-10-CM | POA: Diagnosis not present

## 2019-01-21 DIAGNOSIS — R2689 Other abnormalities of gait and mobility: Secondary | ICD-10-CM | POA: Diagnosis not present

## 2019-01-21 DIAGNOSIS — R21 Rash and other nonspecific skin eruption: Secondary | ICD-10-CM | POA: Diagnosis not present

## 2019-02-08 ENCOUNTER — Other Ambulatory Visit: Payer: Self-pay

## 2019-02-08 ENCOUNTER — Ambulatory Visit: Payer: PPO | Attending: Family Medicine

## 2019-02-08 DIAGNOSIS — M6281 Muscle weakness (generalized): Secondary | ICD-10-CM | POA: Diagnosis not present

## 2019-02-08 DIAGNOSIS — R2681 Unsteadiness on feet: Secondary | ICD-10-CM | POA: Insufficient documentation

## 2019-02-08 NOTE — Therapy (Addendum)
[place] MAIN [facility] SERVICES [address] [place], [place], [address] Phone: [phone]   Fax:  [phone]   Physical Therapy Evaluation  Patient Details  Name: Kanishka Sparks MRN: [id number] Date of Birth: 1940-11-27 Referring Provider (PT): Grayland Ormond PA-C   Encounter Date: 02/08/2019  PT End of Session - 02/11/19 1147    Visit Number  1    Number of Visits  13    Date for PT Re-Evaluation  05/03/19    Authorization Type  eval 02/08/19    PT Start Time  1045    PT Stop Time  1215    PT Time Calculation (min)  90 min    Equipment Utilized During Treatment  Gait belt    Activity Tolerance  Patient tolerated treatment well    Behavior During Therapy  WFL for tasks assessed/performed       Past Medical History:  Diagnosis Date  . Anxiety   . Cancer ([place]) 1999   basil cell on face  . GERD (gastroesophageal reflux disease)    history of  . History of kidney stones    twice  . HOH (hard of hearing)   . Hypertension   . Hypothyroidism   . PONV (postoperative nausea and vomiting)    nausea only with 2 prior surgeries  . Tinnitus     Past Surgical History:  Procedure Laterality Date  . ABDOMINAL HYSTERECTOMY    . basil cell     face  . BLEPHAROPLASTY    . HEMORROIDECTOMY    . JOINT REPLACEMENT Left 11/2016  . KNEE ARTHROPLASTY Left 11/14/2016   Procedure: COMPUTER ASSISTED TOTAL KNEE ARTHROPLASTY;  Surgeon: Dereck Leep, MD;  Location: [facility] ORS;  Service: Orthopedics;  Laterality: Left;  . KNEE ARTHROPLASTY Right 03/15/2017   Procedure: COMPUTER ASSISTED TOTAL KNEE ARTHROPLASTY;  Surgeon: Dereck Leep, MD;  Location: [facility] ORS;  Service: Orthopedics;  Laterality: Right;  . KNEE ARTHROSCOPY Left   . LIPOSUCTION    . PELVIC FLOOR REPAIR    . TOE SURGERY Right   . VARICOSE VEIN SURGERY      There were no vitals filed for this visit.   Subjective Assessment - 02/11/19 1141    Subjective  Dizziness/imbalance     Pertinent History  Pt referred by Grayland Ormond for issues related to her balance. She reports that she has a history of BPPV and started to notice she was feeling "swimmy-headed" when she bent over or turned her head really quickly. This started in early September 2020. She suffered a fall on 01/10/19 with a bruise on her L knee and an abrasion on her R knee as well. She has been having bilateral foot pain/burning and heaviness in her legs as well. The foot pain is worse in the morning but improves as she moves around. She had an ABI performed and states that it was normal. She has a remote history of low back pain and in 2005 she injured herself and had sciatica down her RLE. She had to go to the pain clinic and she had to have epidural steroid injections which improved her pain however she continued to experience numbness in digits 2, 3, and 4 of her R foot. She also has a history of multiple foot surgeries on the R foot and is unsure if this could possibly be the cause. At the end of the evaluation pt remembered that she bend down in the bathroom a couple days before her  fall and hit her head very hard on the counter top. She states that she had a hematoma on her forehead. She denies any headaches, immediate dizziness, or focal neurological deficits. Pt denies taking blood thinners.    Diagnostic tests  ABI which was normal    Patient Stated Goals  Improve balance, leg strength, and walking endurance    Currently in Pain?  No/denies   R foot pain this AM however not related to current episode        VESTIBULAR AND BALANCE EVALUATION   HISTORY:  Subjective history of current problem: Pt referred by Grayland Ormond for issues related to her balance. She reports that she has a history of BPPV and started to notice she was feeling "swimmy-headed" when she bent over or turned her head really quickly. This started in early September 2020. She suffered a fall on 01/10/19 with a bruise on her L knee and an  abrasion on her R knee as well. She has been having bilateral foot pain/burning and heaviness in her legs as well. The foot pain is worse in the morning but improves as she moves around. She had an ABI performed and states that it was normal. She has a remote history of low back pain and in 2005 she injured herself and had sciatica down her RLE. She had to go to the pain clinic and she had to have epidural steroid injections which improved her pain however she continued to experience numbness in digits 2, 3, and 4 of her R foot. She also has a history of multiple foot surgeries on the R foot and is unsure if this could possibly be the cause. At the end of the evaluation pt remembered that she bend down in the bathroom a couple days before her fall and hit her head very hard on the counter top. She states that she had a hematoma on her forehead. She denies any headaches, immediate dizziness, or focal neurological deficits. Pt denies taking blood thinners.   Description of dizziness: unsteadiness, "whooziness." No true vertigo Frequency: At least once/day Duration: Less than 1 minute Symptom nature: (motion provoked, positional, spontaneous, constant, variable, intermittent) always motion-provoked  Provocative Factors: looking up, bending forward Easing Factors:Waiting for symptoms to pass  Progression of symptoms: (better, worse, no change since onset) better History of similar episodes: History of BPPV at least 5 years ago which was much worse than her current episodes  Falls (yes/no): Yes Number of falls in past 6 months: 1  Prior Functional Level: Independent for ADL, IADLs, and gait without assistive device. She has hiking poles at home and has tried to use them for long walks.  Auditory complaints (tinnitus, pain, drainage, hearing loss, aural fullness): hx of bilateral hearing aids, no aural fullness, no drainage, no pain, hx of bilateral tinnitus Vision (diplopia, visual field loss, recent  changes, last eye exam): no visual changes, last eye exam August 2020. Wears corrective lenses at all times  Red Flags: (dysarthria, dysphagia, drop attacks, bowel and bladder changes, recent weight loss/gain) Review of systems negative for red flags with the exception of weight gain over the last year since her second knee replacement.    EXAMINATION  POSTURE:   NEUROLOGICAL SCREEN: (2+ unless otherwise noted.) N=normal  Ab=abnormal  Level Dermatome R L Myotome R L Reflex R L  C3 Anterior Neck N N Sidebend C2-3 N N Jaw CN V    C4 Top of Shoulder N N Shoulder Shrug C4 N N Hoffman's  UMN N N  C5 Lateral Upper Arm N N Shoulder ABD C4-5 N N Biceps C5-6    C6 Lateral Arm/ Thumb N N Arm Flex/ Wrist Ext C5-6 N N Brachiorad. C5-6    C7 Middle Finger N N Arm Ext//Wrist Flex C6-7 N N Triceps C7    C8 4th & 5th Finger N N Flex/ Ext Carpi Ulnaris C8 N N Patellar (L3-4)    T1 Medial Arm N N Interossei T1 N N Gastrocnemius    L2 Medial thigh/groin N N Illiopsoas (L2-3) N N     L3 Lower thigh/med.knee N N Quadriceps (L3-4) N N     L4 Medial leg/lat thigh N N Tibialis Ant (L4-5) A N     L5 Lat. leg & dorsal foot N N EHL (L5) A N     S1 post/lat foot/thigh/leg N N Gastrocnemius (S1-2) A N     S2 Post./med. thigh & leg N N Hamstrings (L4-S3) N N       Cranial Nerves Visual acuity and visual fields are intact  Extraocular muscles are intact  Facial sensation is intact bilaterally  Facial strength is intact bilaterally  Hearing is normal as tested by gross conversation Palate elevates midline, normal phonation  Shoulder shrug strength is intact  Tongue protrudes midline    SOMATOSENSORY:         Sensation           Intact      Diminished         Absent  Light touch Normal      COORDINATION: Finger to Nose: Normal Heel to Shin: Normal with R leg, L leg is mildly dysmetric Pronator Drift: Negative Rapid Alternating Movements: Normal Finger to Thumb Opposition: Normal  MUSCULOSKELETAL  SCREEN: Cervical Spine ROM: Mod/severe loss of extension, min/mod loss in all other directions   ROM: UE/LE grossly WNL  MMT: R ankle dorsiflexion and 2-4th digit extension weakness  Functional Mobility: Independent for transfers and ambulation    POSTURAL CONTROL TESTS:   Clinical Test of Sensory Interaction for Balance    (CTSIB):  CONDITION TIME STRATEGY SWAY  Eyes open, firm surface 30 seconds Ankle 1+  Eyes closed, firm surface 30 seconds Ankle, knee 2+  Eyes open, foam surface 30 seconds Ankle, knee 3+  Eyes closed, foam surface 9 seconds Ankle, knee 4+    OCULOMOTOR / VESTIBULAR TESTING:  Oculomotor Exam- Room Light  Findings Comments  Ocular Alignment normal   Ocular ROM normal   Spontaneous Nystagmus normal   Gaze-Holding Nystagmus abnormal L horizontal beating nystagmus at L mid-range. No spontaneous or R gaze-evoked nystagmus  End-Gaze Nystagmus normal   Vergence (normal 2-3") normal   Smooth Pursuit abnormal Saccadic in all directions  Cross-Cover Test not examined   Saccades abnormal Consistently hypometric in all directions with corrective saccade required to hit target. Velocity is slow  VOR Cancellation normal   Left Head Thrust normal   Right Head Thrust abnormal Corrective saccade   Static Acuity not examined   Dynamic Acuity not examined     Oculomotor Exam- Fixation Suppressed: Deferred at this time   BPPV TESTS: Performed on inverted mat table  Symptoms Duration Intensity Nystagmus  L Dix-Hallpike None   None  R Dix-Hallpike None   None  L Head Roll None   None  R Head Roll None   None  L Sidelying Test      R Sidelying Test        FUNCTIONAL  OUTCOME MEASURES   Results Comments  BERG 51/56 Fall risk, in need of intervention  DGI 23/24 Fall risk, in need of intervention  5TSTS 12.83 seconds WNL  10 Meter Gait Speed Self-selected: 10.75s = 0.93 m/s; Fastest: 8.1s = 1.23 m/s WNL                                    [facility] PT Assessment - 02/11/19 1142      Assessment   Medical Diagnosis  Vestibular/Balance    Referring Provider (PT)  Grayland Ormond PA-C    Onset Date/Surgical Date  01/08/19    Hand Dominance  Right    Next MD Visit  None scheduled    Prior Therapy  Not for balance, has been treated for BPPV in the past. Previous PT for orthopedic injuries/surgeries      Precautions   Precautions  Fall      Restrictions   Weight Bearing Restrictions  No      Balance Screen   Has the patient fallen in the past 6 months  Yes    How many times?  1    Has the patient had a decrease in activity level because of a fear of falling?   Yes    Is the patient reluctant to leave their home because of a fear of falling?   No      Home Film/video editor residence    Living Arrangements  Spouse/significant other    Available Help at Discharge  Family    Type of [place] to enter    Entrance Stairs-Number of Steps  4    Entrance Stairs-Rails  Right;Left;Cannot reach both      Prior Function   Level of Independence  Independent    Vocation  Retired    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     Leisure  Yoga (sometimes chair yoga), walking       Standardized Balance Assessment   Standardized Balance Assessment  Berg Balance Test;Dynamic Gait Index      Berg Balance Test   Sit to Stand  Able to stand without using hands and stabilize independently    Standing Unsupported  Able to stand safely 2 minutes    Sitting with Back Unsupported but Feet Supported on Floor or Stool  Able to sit safely and securely 2 minutes    Stand to Sit  Sits safely with minimal use of hands    Transfers  Able to transfer safely, minor use of hands    Standing Unsupported with Eyes Closed  Able to stand 10 seconds with supervision    Standing Unsupported with Feet Together  Able to place feet together independently and stand 1  minute safely    From Standing, Reach Forward with Outstretched Arm  Can reach confidently >25 cm (10")    From Standing Position, Pick up Object from Floor  Able to pick up shoe safely and easily    From Standing Position, Turn to Look Behind Over each Shoulder  Looks behind from both sides and weight shifts well    Turn 360 Degrees  Able to turn 360 degrees safely in 4 seconds or less    Standing Unsupported, Alternately Place Feet on Step/Stool  Able to stand independently and safely and complete 8 steps in 20  seconds    Standing Unsupported, One Foot in Front  Able to plae foot ahead of the other independently and hold 30 seconds    Standing on One Leg  Tries to lift leg/unable to hold 3 seconds but remains standing independently    Total Score  51      Dynamic Gait Index   Level Surface  Normal    Change in Gait Speed  Normal    Gait with Horizontal Head Turns  Normal    Gait with Vertical Head Turns  Normal    Gait and Pivot Turn  Normal    Step Over Obstacle  Normal    Step Around Obstacles  Normal    Steps  Mild Impairment    Total Score  23                Objective measurements completed on examination: See above findings.              PT Education - 02/11/19 1144    Education Details  Plan of care    Person(s) Educated  Patient    Methods  Explanation;Demonstration;Handout    Comprehension  Verbalized understanding;Returned demonstration;Verbal cues required       PT Short Term Goals - 02/11/19 1212      PT SHORT TERM GOAL #1   Title  Pt will be independent with HEP in order to improve balance and decrease dizziness in order to decrease fall risk and improve symptom-free function at home.    Time  12    Period  Weeks    Status  New    Target Date  05/03/19        PT Long Term Goals - 02/11/19 1212      PT LONG TERM GOAL #1   Title  Pt will improve BERG by at least 3 points in order to demonstrate clinically significant improvement in  balance.    Baseline  02/08/19: 51/56    Time  12    Period  Weeks    Status  New    Target Date  05/06/19      PT LONG TERM GOAL #2   Title  Pt wil report at least 80% improvement in her dizziness symptoms in order to decrease her fall risk and improve her symptom-free function at home.    Time  12    Period  Weeks    Status  New    Target Date  05/06/19             Plan - 02/11/19 1146    Clinical Impression Statement  Pt is a pleasant 78 year-old female referred for difficulty with balance and subjective dizziness. She reports symptoms occur with quick head motions and typically resolve within 1 minute. Remote history of BPPV. All canal testing is negative for BPPV on this date. PT examination reveals grossly normal DGI, 5TSTS, and 63m gait speed. She presents with higher level balance deficits as noted by BERG of 51/56. Oculomotor testing shows a mix of central and peripheral findings with saccadic smooth pursuit and consistently slow and hypometric saccade testing. She appears to have a L horizontal beating nystagmus with L gaze holding and well as a positive R head impulse test. These indicate a possible R vestibular weakness. Will conduct further testing and follow-up visit. Pt presents with deficits in balance and dizziness and will benefit from skilled PT services to address these deficits in order to improve symptom-free function at  home.    Personal Factors and Comorbidities  Age;Comorbidity 3+;Past/Current Experience    Comorbidities  anxiety, tinnitus, HTN    Examination-Activity Limitations  Locomotion Level;Bend;Transfers    Examination-Participation Restrictions  Cleaning;Shop;Yard Work    Stability/Clinical Decision Making  Unstable/Unpredictable    Clinical Decision Making  High    Rehab Potential  Good    PT Frequency  1x / week    PT Duration  12 weeks    PT Treatment/Interventions  ADLs/Self Care Home Management;Aquatic Therapy;Canalith  Repostioning;Cryotherapy;Electrical Stimulation;Iontophoresis 4mg /ml Dexamethasone;Moist Heat;Traction;Ultrasound;DME Instruction;Gait training;Stair training;Functional mobility training;Therapeutic activities;Therapeutic exercise;Balance training;Neuromuscular re-education;Patient/family education;Manual techniques;Passive range of motion;Dry needling;Vestibular;Spinal Manipulations;Joint Manipulations    PT Next Visit Plan  Have pt complete ABC/DHI and update goals as appropriate, Recheck BPPV testing, IR goggles for fixation suppression testing at some point, DVA, FGA, initiate VOR x 1 horizontal as well as any additional balance/strength exercises    PT Home Exercise Plan  None currently    Consulted and Agree with Plan of Care  Patient       Patient will benefit from skilled therapeutic intervention in order to improve the following deficits and impairments:  Dizziness, Decreased balance  Visit Diagnosis: Unsteadiness on feet - Plan: PT plan of care cert/re-cert  Muscle weakness (generalized) - Plan: PT plan of care cert/re-cert     Problem List Patient Active Problem List   Diagnosis Date Noted  . S/P total knee arthroplasty 11/14/2016   Phillips Grout PT, DPT, GCS  Huprich,Jason 02/11/2019, 12:28 PM  [place] MAIN [facility] SERVICES [address] [place], [place], [address] Phone: [phone]   Fax:  [phone]  Name: Torie Olivarez MRN: [id number] Date of Birth: 1940-10-05

## 2019-02-11 NOTE — Addendum Note (Signed)
Addended by: Roxana Hires D on: 02/11/2019 12:29 PM   Modules accepted: Orders

## 2019-02-15 ENCOUNTER — Ambulatory Visit: Payer: PPO

## 2019-02-15 ENCOUNTER — Other Ambulatory Visit: Payer: Self-pay

## 2019-02-15 DIAGNOSIS — M6281 Muscle weakness (generalized): Secondary | ICD-10-CM

## 2019-02-15 DIAGNOSIS — R2681 Unsteadiness on feet: Secondary | ICD-10-CM | POA: Diagnosis not present

## 2019-02-15 NOTE — Therapy (Signed)
[place] MAIN [facility] SERVICES [address] [place], [place], [address] Phone: [phone]   Fax:  [phone]  Physical Therapy Treatment  Patient Details  Name: Mary Elliott MRN: [id number] Date of Birth: 27-Nov-1940 Referring Provider (PT): Grayland Ormond PA-C   Encounter Date: 02/15/2019  PT End of Session - 02/15/19 1035    Visit Number  2    Number of Visits  13    Date for PT Re-Evaluation  05/03/19    Authorization Type  eval 02/08/19; 2/10 progress note    PT Start Time  1030    PT Stop Time  1116    PT Time Calculation (min)  46 min    Equipment Utilized During Treatment  Gait belt    Activity Tolerance  Patient tolerated treatment well    Behavior During Therapy  WFL for tasks assessed/performed       Past Medical History:  Diagnosis Date  . Anxiety   . Cancer ([place]) 1999   basil cell on face  . GERD (gastroesophageal reflux disease)    history of  . History of kidney stones    twice  . HOH (hard of hearing)   . Hypertension   . Hypothyroidism   . PONV (postoperative nausea and vomiting)    nausea only with 2 prior surgeries  . Tinnitus     Past Surgical History:  Procedure Laterality Date  . ABDOMINAL HYSTERECTOMY    . basil cell     face  . BLEPHAROPLASTY    . HEMORROIDECTOMY    . JOINT REPLACEMENT Left 11/2016  . KNEE ARTHROPLASTY Left 11/14/2016   Procedure: COMPUTER ASSISTED TOTAL KNEE ARTHROPLASTY;  Surgeon: Dereck Leep, MD;  Location: [facility] ORS;  Service: Orthopedics;  Laterality: Left;  . KNEE ARTHROPLASTY Right 03/15/2017   Procedure: COMPUTER ASSISTED TOTAL KNEE ARTHROPLASTY;  Surgeon: Dereck Leep, MD;  Location: [facility] ORS;  Service: Orthopedics;  Laterality: Right;  . KNEE ARTHROSCOPY Left   . LIPOSUCTION    . PELVIC FLOOR REPAIR    . TOE SURGERY Right   . VARICOSE VEIN SURGERY      There were no vitals filed for this visit.  Subjective Assessment - 02/15/19 1032    Subjective  Patient  reports she now is not having the "swinging" sensation that she had been having. Patient reports she has been able to bend forward without sensation of falling forward in the last few days. Patient reports she does want to be able to walk better. Patient reports she is having some pain in her right arch of her foot.    Pertinent History  Pt referred by Grayland Ormond for issues related to her balance. She reports that she has a history of BPPV and started to notice she was feeling "swimmy-headed" when she bent over or turned her head really quickly. This started in early September 2020. She suffered a fall on 01/10/19 with a bruise on her L knee and an abrasion on her R knee as well. She has been having bilateral foot pain/burning and heaviness in her legs as well. The foot pain is worse in the morning but improves as she moves around. She had an ABI performed and states that it was normal. She has a remote history of low back pain and in 2005 she injured herself and had sciatica down her RLE. She had to go to the pain clinic and she had to have epidural steroid injections which improved her pain  however she continued to experience numbness in digits 2, 3, and 4 of her R foot. She also has a history of multiple foot surgeries on the R foot and is unsure if this could possibly be the cause. At the end of the evaluation pt remembered that she bend down in the bathroom a couple days before her fall and hit her head very hard on the counter top. She states that she had a hematoma on her forehead. She denies any headaches, immediate dizziness, or focal neurological deficits. Pt denies taking blood thinners.    Diagnostic tests  ABI which was normal    Patient Stated Goals  Improve balance, leg strength, and walking endurance    Currently in Pain?  Yes    Pain Score  3     Pain Location  Foot   arch   Pain Orientation  Right;Anterior    Pain Type  Acute pain   states it started in the middle of summer       Patient reports she has been able to bend forward without sensation of falling forward in the last few days.   VOR X 1 exercise:  Demonstrated and educated as to VOR X1. Patient required modeling cues for technique with VOR.  Patient performed VOR X 1 horizontal in sitting 1 rep of 30 seconds and 2 reps of 1 minute each with verbal cues for technique.  Patient reports the target is staying in focus and reports no dizziness.   Non-compliant/ Firm Surface: On firm surface, patient performed feet together static stands with eyes closed and then repeated with eyes closed with horizontal head turns 30 second reps. Patient reports no dizziness or imbalance. On firm surface, worked on semi-tandem stance progressions with alternating lead leg static 30-60 second holds with CGA.   Airex pad:  On Airex pad, patient performed normal base of support static holds and then eyes closed static holds with +2 sway noted multiple 30-60 second holds with CGA. Patient performed feet together on foam with eyes open with +1 sway and then eyes closed with +3 sway with CGA to Min A. Performed multiple 30 second reps.  Patient using ankle strategies to regain balance.  Patient denies dizziness.   Step Taps: Patient performed standing on Airex pad while performing alternating step taps on 6" wooden step with CGA.  Patient performed 2 sets of 10 reps each foot. Patient leans forward and onto her toes at times.  Patient with imbalance noted and patient needed to reach for // bar for support several times to regain balance.   Slow Marching: Patient performed on firm surface slow marching with 3 second holds with CGA. Patient performed 10 reps each leg.  Sit to Stands:  Patient performed 2 sets of 10 reps and then 1 set of 15 reps sit to stand from chair without using UEs with CGA.  Patient reports fatigue in legs towards last few repetitions. Patient was more challenged by 15 rep sets.   FUNCTIONAL OUTCOME  MEASURES:  Results Comments  Anthony 4/100 low perception of handicap  ABC Scale 93.7% normal    PT Short Term Goals - 02/11/19 1212      PT SHORT TERM GOAL #1   Title  Pt will be independent with HEP in order to improve balance and decrease dizziness in order to decrease fall risk and improve symptom-free function at home.    Time  12    Period  Weeks    Status  New    Target Date  05/03/19        PT Long Term Goals - 02/11/19 1212      PT LONG TERM GOAL #1   Title  Pt will improve BERG by at least 3 points in order to demonstrate clinically significant improvement in balance.    Baseline  02/08/19: 51/56    Time  12    Period  Weeks    Status  New    Target Date  05/03/19      PT LONG TERM GOAL #2   Title  Pt wil report at least 80% improvement in her dizziness symptoms in order to decrease her fall risk and improve her symptom-free function at home.    Time  12    Period  Weeks    Status  New    Target Date  05/03/19            Plan - 02/15/19 1117    Clinical Impression Statement  Patient reports that she has not had any dizziness episodes since last week but that she still has difficulty with her balance. Patient denied dizziness with activities in session this date. Patient was challenged by activities on Airex pad, eyes closed activties, narrow base of support and single leg stance activities. Patient had difficulty with performing slow controlled static holds with slow marching activity and would benefit from continued practice with single leg stance activities in order to help reduce her falls risk. Patient reports fatigue in legs with sit to stand exercise and would benefit from further strengthening and balance exercises. Patient issued HEP this date. Patient would benefit from continued PT services to work on functional deficits and goals.    Personal Factors and Comorbidities  Age;Comorbidity 3+;Past/Current Experience    Comorbidities  anxiety, tinnitus, HTN     Examination-Activity Limitations  Locomotion Level;Bend;Transfers    Examination-Participation Restrictions  Cleaning;Shop;Yard Work    Stability/Clinical Decision Making  Unstable/Unpredictable    Rehab Potential  Good    PT Frequency  1x / week    PT Duration  12 weeks    PT Treatment/Interventions  ADLs/Self Care Home Management;Aquatic Therapy;Canalith Repostioning;Cryotherapy;Electrical Stimulation;Iontophoresis 4mg /ml Dexamethasone;Moist Heat;Traction;Ultrasound;DME Instruction;Gait training;Stair training;Functional mobility training;Therapeutic activities;Therapeutic exercise;Balance training;Neuromuscular re-education;Patient/family education;Manual techniques;Passive range of motion;Dry needling;Vestibular;Spinal Manipulations;Joint Manipulations    PT Next Visit Plan  Have pt complete ABC/DHI and update goals as appropriate, Recheck BPPV testing, IR goggles for fixation suppression testing at some point, DVA, FGA, initiate VOR x 1 horizontal as well as any additional balance/strength exercises    PT Home Exercise Plan  None currently    Consulted and Agree with Plan of Care  Patient       Patient will benefit from skilled therapeutic intervention in order to improve the following deficits and impairments:  Dizziness, Decreased balance  Visit Diagnosis: Unsteadiness on feet  Muscle weakness (generalized)     Problem List Patient Active Problem List   Diagnosis Date Noted  . S/P total knee arthroplasty 11/14/2016   Lady Deutscher PT, DPT [phone] Lady Deutscher 02/15/2019, 11:49 AM  [place] MAIN [facility] SERVICES [address] [place], [place], [address] Phone: [phone]   Fax:  [phone]  Name: Mary Elliott MRN: [id number] Date of Birth: 1940-07-11

## 2019-02-22 ENCOUNTER — Ambulatory Visit
Admission: RE | Admit: 2019-02-22 | Discharge: 2019-02-22 | Disposition: A | Discharge status: Home / Self Care | Payer: PPO | Source: Ambulatory Visit | Attending: Internal Medicine | Admitting: Internal Medicine

## 2019-02-22 ENCOUNTER — Ambulatory Visit: Payer: PPO

## 2019-02-22 ENCOUNTER — Other Ambulatory Visit: Payer: Self-pay

## 2019-02-22 DIAGNOSIS — Z1231 Encounter for screening mammogram for malignant neoplasm of breast: Secondary | ICD-10-CM | POA: Insufficient documentation

## 2019-02-22 DIAGNOSIS — R2681 Unsteadiness on feet: Secondary | ICD-10-CM

## 2019-02-22 DIAGNOSIS — M6281 Muscle weakness (generalized): Secondary | ICD-10-CM

## 2019-02-22 NOTE — Therapy (Signed)
[place] MAIN [facility] SERVICES [address] [place], [place], [address] Phone: [phone]   Fax:  [phone]  Physical Therapy Discharge  Patient Details  Name: Mary Elliott MRN: [id number] Date of Birth: [birth year] Referring Provider (PT): Grayland Ormond PA-C   Encounter Date: 02/22/2019  PT End of Session - 02/22/19 1032    Visit Number  3    Number of Visits  13    Date for PT Re-Evaluation  05/03/19    Authorization Type  eval 02/08/19; 2/10 progress note    PT Start Time  1032    PT Stop Time  1115    PT Time Calculation (min)  43 min    Equipment Utilized During Treatment  Gait belt    Activity Tolerance  Patient tolerated treatment well    Behavior During Therapy  [facility] for tasks assessed/performed       Past Medical History:  Diagnosis Date  . Anxiety   . Cancer ([place]) 1999   basil cell on face  . GERD (gastroesophageal reflux disease)    history of  . History of kidney stones    twice  . HOH (hard of hearing)   . Hypertension   . Hypothyroidism   . PONV (postoperative nausea and vomiting)    nausea only with 2 prior surgeries  . Tinnitus     Past Surgical History:  Procedure Laterality Date  . ABDOMINAL HYSTERECTOMY    . basil cell     face  . BLEPHAROPLASTY    . HEMORROIDECTOMY    . JOINT REPLACEMENT Left 11/2016  . KNEE ARTHROPLASTY Left 11/14/2016   Procedure: COMPUTER ASSISTED TOTAL KNEE ARTHROPLASTY;  Surgeon: Dereck Leep, MD;  Location: [facility] ORS;  Service: Orthopedics;  Laterality: Left;  . KNEE ARTHROPLASTY Right 03/15/2017   Procedure: COMPUTER ASSISTED TOTAL KNEE ARTHROPLASTY;  Surgeon: Dereck Leep, MD;  Location: [facility] ORS;  Service: Orthopedics;  Laterality: Right;  . KNEE ARTHROSCOPY Left   . LIPOSUCTION    . PELVIC FLOOR REPAIR    . TOE SURGERY Right   . VARICOSE VEIN SURGERY      There were no vitals filed for this visit.  Subjective Assessment - 02/22/19 1036    Subjective  Pt  reports that her dizzness has resolved, however she states that she has increasing R arch pain that causes her to feel more unsteady.    Pertinent History  Pt referred by Grayland Ormond for issues related to her balance. She reports that she has a history of BPPV and started to notice she was feeling "swimmy-headed" when she bent over or turned her head really quickly. This started in early September 2020. She suffered a fall on 01/10/19 with a bruise on her L knee and an abrasion on her R knee as well. She has been having bilateral foot pain/burning and heaviness in her legs as well. The foot pain is worse in the morning but improves as she moves around. She had an ABI performed and states that it was normal. She has a remote history of low back pain and in 2005 she injured herself and had sciatica down her RLE. She had to go to the pain clinic and she had to have epidural steroid injections which improved her pain however she continued to experience numbness in digits 2, 3, and 4 of her R foot. She also has a history of multiple foot surgeries on the R foot and is unsure if this could  possibly be the cause. At the end of the evaluation pt remembered that she bend down in the bathroom a couple days before her fall and hit her head very hard on the counter top. She states that she had a hematoma on her forehead. She denies any headaches, immediate dizziness, or focal neurological deficits. Pt denies taking blood thinners.    Diagnostic tests  ABI which was normal    Patient Stated Goals  Improve balance, leg strength, and walking endurance    Currently in Pain?  Yes   R arch, unrated            [facility] PT Assessment - 02/22/19 1032      Functional Gait  Assessment   Gait assessed   Yes    Gait Level Surface  Walks 20 ft in less than 7 sec but greater than 5.5 sec, uses assistive device, slower speed, mild gait deviations, or deviates 6-10 in outside of the 12 in walkway width.    Change in Gait  Speed  Able to smoothly change walking speed without loss of balance or gait deviation. Deviate no more than 6 in outside of the 12 in walkway width.    Gait with Horizontal Head Turns  Performs head turns smoothly with slight change in gait velocity (eg, minor disruption to smooth gait path), deviates 6-10 in outside 12 in walkway width, or uses an assistive device.    Gait with Vertical Head Turns  Performs task with slight change in gait velocity (eg, minor disruption to smooth gait path), deviates 6 - 10 in outside 12 in walkway width or uses assistive device    Gait and Pivot Turn  Pivot turns safely within 3 sec and stops quickly with no loss of balance.    Step Over Obstacle  Is able to step over one shoe box (4.5 in total height) but must slow down and adjust steps to clear box safely. May require verbal cueing.    Gait with Narrow Base of Support  Ambulates less than 4 steps heel to toe or cannot perform without assistance.    Gait with Eyes Closed  Walks 20 ft, uses assistive device, slower speed, mild gait deviations, deviates 6-10 in outside 12 in walkway width. Ambulates 20 ft in less than 9 sec but greater than 7 sec.    Ambulating Backwards  Walks 20 ft, uses assistive device, slower speed, mild gait deviations, deviates 6-10 in outside 12 in walkway width.    Steps  Alternating feet, must use rail.    Total Score  19          TREATMENT   Neuromuscular Reeducation: Marye Round reassessed and negative in both directions; roll test reassessed today and negative in both directions.  Fixation suppression testing re-evaluated with negative gaze-evoked nystagmus, negative head shaking test, and negative high-frequency vibration testing;  FGA assessed today: 19/30  VORx1 horizontal in standing x60s with no provocation of symptoms  VORx1 horizontal in standing in semitandem x60 seconds in each direction with no provocation of symptoms  VORx1 horizontal with forward and retrogait  20' in each direction with no provocation of symptoms, but displays more difficulty with retro ambulation  Trouble with dissociating head and body during all exercises   Pt demonstrates good motivation throughout today's session. She has met 1 STG and 1 LTG to date.  She reports complete resolution of her dizziness at this time.  [employer] and Roll test were repeated today with negative tests  bilaterally.  Fixation suppression canalith repositioning testing was also assessed today with negative results.  VORx1 horizontal exercises were progressed today with no provocation of symptoms. Pt has been experiencing worsening R arch pain over the last few weeks that she feels is contributing to her unsteadiness.  At this time, pt states that she would like to discharge from PT and save her PT visits for after she addresses her foot pain with the podiatrist.  Pt states that she would like to make a podiatry appt to get this assessed and get a new order for PT.  Pt is ready for discharge for vestibular therapy at this time.       PT Short Term Goals - 02/22/19 1133      PT SHORT TERM GOAL #1   Title  Pt will be independent with HEP in order to improve balance and decrease dizziness in order to decrease fall risk and improve symptom-free function at home.    Time  12    Period  Weeks    Status  Achieved    Target Date  05/03/19        PT Long Term Goals - 02/22/19 1136      PT LONG TERM GOAL #1   Title  Pt will improve BERG by at least 3 points in order to demonstrate clinically significant improvement in balance.    Baseline  02/08/19: 51/56    Time  12    Period  Weeks    Status  Deferred    Target Date  05/03/19      PT LONG TERM GOAL #2   Title  Pt wil report at least 80% improvement in her dizziness symptoms in order to decrease her fall risk and improve her symptom-free function at home.    Baseline  pt reports 100% improvement in dizziness    Time  12    Period  Weeks    Status   Achieved    Target Date  05/03/19            Plan - 02/22/19 1152    Clinical Impression Statement  Pt demonstrates good motivation throughout today's session. She has met 1 STG and 1 LTG to date.  She reports complete resolution of her dizziness at this time.  [employer] and Roll test were repeated today with negative tests bilaterally.  Fixation suppression oculomotor testing was also assessed today with no significant findings.  VORx1 horizontal exercises were progressed today with no provocation of symptoms. Pt has been experiencing worsening R arch pain over the last few weeks that she feels is contributing to her unsteadiness.  At this time, pt states that she would like to discharge from PT and save her PT visits for after she addresses her foot pain with the podiatrist.  Pt states that she would like to make a podiatry appt to get this assessed and get a new order for PT if needed. Pt is ready for discharge from vestibular therapy at this time.    Personal Factors and Comorbidities  Age;Comorbidity 3+;Past/Current Experience    Comorbidities  anxiety, tinnitus, HTN    Examination-Activity Limitations  Locomotion Level;Bend;Transfers    Examination-Participation Restrictions  Cleaning;Shop;Yard Work    Stability/Clinical Decision Making  Unstable/Unpredictable    Rehab Potential  Good    PT Frequency  1x / week    PT Duration  12 weeks    PT Treatment/Interventions  ADLs/Self Care Home Management;Aquatic Therapy;Canalith Repostioning;Cryotherapy;Electrical Stimulation;Iontophoresis 8m/ml Dexamethasone;Moist Heat;Traction;Ultrasound;DME  Instruction;Gait training;Stair training;Functional mobility training;Therapeutic activities;Therapeutic exercise;Balance training;Neuromuscular re-education;Patient/family education;Manual techniques;Passive range of motion;Dry needling;Vestibular;Spinal Manipulations;Joint Manipulations    PT Next Visit Plan  pt is discharging at this time    PT Home  Exercise Plan  None currently    Consulted and Agree with Plan of Care  Patient       Patient will benefit from skilled therapeutic intervention in order to improve the following deficits and impairments:  Dizziness, Decreased balance  Visit Diagnosis: Unsteadiness on feet  Muscle weakness (generalized)     Problem List Patient Active Problem List   Diagnosis Date Noted  . S/P total knee arthroplasty 11/14/2016    This entire session was performed under direct supervision and direction of a licensed therapist/therapist assistant . I have personally read, edited and approve of the note as written.   Lutricia Horsfall, SPT Phillips Grout PT, DPT, GCS  Huprich,Jason 02/22/2019, 12:11 PM  [place] MAIN [facility] SERVICES [address] [place], [place], [address] Phone: [phone]   Fax:  [phone]  Name: Mary Elliott MRN: [id number] Date of Birth: [birth year]

## 2019-02-25 DIAGNOSIS — M2141 Flat foot [pes planus] (acquired), right foot: Secondary | ICD-10-CM | POA: Diagnosis not present

## 2019-02-25 DIAGNOSIS — M2142 Flat foot [pes planus] (acquired), left foot: Secondary | ICD-10-CM | POA: Diagnosis not present

## 2019-02-25 DIAGNOSIS — M216X1 Other acquired deformities of right foot: Secondary | ICD-10-CM | POA: Diagnosis not present

## 2019-02-25 DIAGNOSIS — G629 Polyneuropathy, unspecified: Secondary | ICD-10-CM | POA: Diagnosis not present

## 2019-02-25 DIAGNOSIS — M76811 Anterior tibial syndrome, right leg: Secondary | ICD-10-CM | POA: Diagnosis not present

## 2019-02-25 DIAGNOSIS — M216X2 Other acquired deformities of left foot: Secondary | ICD-10-CM | POA: Diagnosis not present

## 2019-02-25 DIAGNOSIS — M7731 Calcaneal spur, right foot: Secondary | ICD-10-CM | POA: Diagnosis not present

## 2019-02-25 DIAGNOSIS — M79671 Pain in right foot: Secondary | ICD-10-CM | POA: Diagnosis not present

## 2019-02-28 DIAGNOSIS — X32XXXA Exposure to sunlight, initial encounter: Secondary | ICD-10-CM | POA: Diagnosis not present

## 2019-02-28 DIAGNOSIS — D2262 Melanocytic nevi of left upper limb, including shoulder: Secondary | ICD-10-CM | POA: Diagnosis not present

## 2019-02-28 DIAGNOSIS — Z85828 Personal history of other malignant neoplasm of skin: Secondary | ICD-10-CM | POA: Diagnosis not present

## 2019-02-28 DIAGNOSIS — L538 Other specified erythematous conditions: Secondary | ICD-10-CM | POA: Diagnosis not present

## 2019-02-28 DIAGNOSIS — L820 Inflamed seborrheic keratosis: Secondary | ICD-10-CM | POA: Diagnosis not present

## 2019-02-28 DIAGNOSIS — L570 Actinic keratosis: Secondary | ICD-10-CM | POA: Diagnosis not present

## 2019-02-28 DIAGNOSIS — L718 Other rosacea: Secondary | ICD-10-CM | POA: Diagnosis not present

## 2019-02-28 DIAGNOSIS — D225 Melanocytic nevi of trunk: Secondary | ICD-10-CM | POA: Diagnosis not present

## 2019-02-28 DIAGNOSIS — D2261 Melanocytic nevi of right upper limb, including shoulder: Secondary | ICD-10-CM | POA: Diagnosis not present

## 2019-03-01 ENCOUNTER — Ambulatory Visit: Payer: PPO

## 2019-03-18 DIAGNOSIS — M79671 Pain in right foot: Secondary | ICD-10-CM | POA: Diagnosis not present

## 2019-03-18 DIAGNOSIS — M7731 Calcaneal spur, right foot: Secondary | ICD-10-CM | POA: Diagnosis not present

## 2019-03-18 DIAGNOSIS — M76811 Anterior tibial syndrome, right leg: Secondary | ICD-10-CM | POA: Diagnosis not present

## 2019-03-18 DIAGNOSIS — G629 Polyneuropathy, unspecified: Secondary | ICD-10-CM | POA: Diagnosis not present

## 2019-03-18 DIAGNOSIS — M216X1 Other acquired deformities of right foot: Secondary | ICD-10-CM | POA: Diagnosis not present

## 2019-03-18 DIAGNOSIS — M216X2 Other acquired deformities of left foot: Secondary | ICD-10-CM | POA: Diagnosis not present

## 2019-03-18 DIAGNOSIS — M76821 Posterior tibial tendinitis, right leg: Secondary | ICD-10-CM | POA: Diagnosis not present

## 2019-03-18 DIAGNOSIS — M2141 Flat foot [pes planus] (acquired), right foot: Secondary | ICD-10-CM | POA: Diagnosis not present

## 2019-03-18 DIAGNOSIS — M2142 Flat foot [pes planus] (acquired), left foot: Secondary | ICD-10-CM | POA: Diagnosis not present

## 2019-03-21 DIAGNOSIS — F410 Panic disorder [episodic paroxysmal anxiety] without agoraphobia: Secondary | ICD-10-CM | POA: Diagnosis not present

## 2019-03-21 DIAGNOSIS — F411 Generalized anxiety disorder: Secondary | ICD-10-CM | POA: Diagnosis not present

## 2019-03-21 DIAGNOSIS — F3342 Major depressive disorder, recurrent, in full remission: Secondary | ICD-10-CM | POA: Diagnosis not present

## 2019-03-27 DIAGNOSIS — M7751 Other enthesopathy of right foot: Secondary | ICD-10-CM | POA: Diagnosis not present

## 2019-03-27 DIAGNOSIS — R262 Difficulty in walking, not elsewhere classified: Secondary | ICD-10-CM | POA: Diagnosis not present

## 2019-03-27 DIAGNOSIS — M25571 Pain in right ankle and joints of right foot: Secondary | ICD-10-CM | POA: Diagnosis not present

## 2019-03-27 DIAGNOSIS — R29898 Other symptoms and signs involving the musculoskeletal system: Secondary | ICD-10-CM | POA: Diagnosis not present

## 2019-04-02 DIAGNOSIS — R200 Anesthesia of skin: Secondary | ICD-10-CM | POA: Diagnosis not present

## 2019-04-02 DIAGNOSIS — R202 Paresthesia of skin: Secondary | ICD-10-CM | POA: Diagnosis not present

## 2019-04-03 DIAGNOSIS — M7751 Other enthesopathy of right foot: Secondary | ICD-10-CM | POA: Diagnosis not present

## 2019-04-11 DIAGNOSIS — M7751 Other enthesopathy of right foot: Secondary | ICD-10-CM | POA: Diagnosis not present

## 2019-04-19 DIAGNOSIS — M7751 Other enthesopathy of right foot: Secondary | ICD-10-CM | POA: Diagnosis not present

## 2019-05-14 DIAGNOSIS — M79602 Pain in left arm: Secondary | ICD-10-CM | POA: Diagnosis not present

## 2019-05-14 DIAGNOSIS — R29818 Other symptoms and signs involving the nervous system: Secondary | ICD-10-CM | POA: Diagnosis not present

## 2019-05-14 DIAGNOSIS — R200 Anesthesia of skin: Secondary | ICD-10-CM | POA: Diagnosis not present

## 2019-05-19 ENCOUNTER — Ambulatory Visit: Payer: Medicare Other | Attending: Internal Medicine

## 2019-05-19 DIAGNOSIS — Z23 Encounter for immunization: Secondary | ICD-10-CM

## 2019-05-19 NOTE — Progress Notes (Signed)
   Covid-19 Vaccination Clinic  Name:  Shavante Birchard    MRN: [id number] DOB: 1940-06-10  05/19/2019  Ms. Dupas was observed post Covid-19 immunization for 30 minutes based on pre-vaccination screening without incidence. She was provided with Vaccine Information Sheet and instruction to access the V-Safe system.   Ms. Getman was instructed to call 911 with any severe reactions post vaccine: Marland Kitchen Difficulty breathing  . Swelling of your face and throat  . A fast heartbeat  . A bad rash all over your body  . Dizziness and weakness    Immunizations Administered    Name Date Dose VIS Date Route   Pfizer COVID-19 Vaccine 05/19/2019  1:22 PM 0.3 mL 04/19/2019 Intramuscular   Manufacturer: [employer]   Lot: [id number]   [place]: [id number]

## 2019-05-30 DIAGNOSIS — I10 Essential (primary) hypertension: Secondary | ICD-10-CM | POA: Diagnosis not present

## 2019-05-30 DIAGNOSIS — M25512 Pain in left shoulder: Secondary | ICD-10-CM | POA: Diagnosis not present

## 2019-05-30 DIAGNOSIS — W1789XA Other fall from one level to another, initial encounter: Secondary | ICD-10-CM | POA: Diagnosis not present

## 2019-05-30 DIAGNOSIS — F3341 Major depressive disorder, recurrent, in partial remission: Secondary | ICD-10-CM | POA: Diagnosis not present

## 2019-06-07 ENCOUNTER — Ambulatory Visit: Payer: PPO

## 2019-06-08 ENCOUNTER — Ambulatory Visit: Payer: PPO | Attending: Internal Medicine

## 2019-06-08 DIAGNOSIS — Z23 Encounter for immunization: Secondary | ICD-10-CM | POA: Insufficient documentation

## 2019-06-08 NOTE — Progress Notes (Signed)
   Covid-19 Vaccination Clinic  Name:  Merla Mendias    MRN: [id number] DOB: Dec 31, 1940  06/08/2019  Ms. Scarpulla was observed post Covid-19 immunization for 15 minutes without incidence. She was provided with Vaccine Information Sheet and instruction to access the V-Safe system.   Ms. Kofler was instructed to call 911 with any severe reactions post vaccine: Marland Kitchen Difficulty breathing  . Swelling of your face and throat  . A fast heartbeat  . A bad rash all over your body  . Dizziness and weakness    Immunizations Administered    Name Date Dose VIS Date Route   Pfizer COVID-19 Vaccine 06/08/2019 10:53 AM 0.3 mL 04/19/2019 Intramuscular   Manufacturer: [place]   Lot: [id number]   [place]: [id number]

## 2019-06-12 DIAGNOSIS — R29898 Other symptoms and signs involving the musculoskeletal system: Secondary | ICD-10-CM | POA: Diagnosis not present

## 2019-06-12 DIAGNOSIS — M25512 Pain in left shoulder: Secondary | ICD-10-CM | POA: Diagnosis not present

## 2019-06-12 DIAGNOSIS — M25612 Stiffness of left shoulder, not elsewhere classified: Secondary | ICD-10-CM | POA: Diagnosis not present

## 2019-06-19 DIAGNOSIS — M25512 Pain in left shoulder: Secondary | ICD-10-CM | POA: Diagnosis not present

## 2019-06-24 DIAGNOSIS — H2513 Age-related nuclear cataract, bilateral: Secondary | ICD-10-CM | POA: Diagnosis not present

## 2019-06-26 DIAGNOSIS — M25512 Pain in left shoulder: Secondary | ICD-10-CM | POA: Diagnosis not present

## 2019-07-01 DIAGNOSIS — M25512 Pain in left shoulder: Secondary | ICD-10-CM | POA: Diagnosis not present

## 2019-07-01 DIAGNOSIS — M19019 Primary osteoarthritis, unspecified shoulder: Secondary | ICD-10-CM | POA: Diagnosis not present

## 2019-07-01 DIAGNOSIS — M75102 Unspecified rotator cuff tear or rupture of left shoulder, not specified as traumatic: Secondary | ICD-10-CM | POA: Diagnosis not present

## 2019-07-03 ENCOUNTER — Other Ambulatory Visit: Payer: Self-pay | Admitting: Orthopedic Surgery

## 2019-07-03 DIAGNOSIS — M75102 Unspecified rotator cuff tear or rupture of left shoulder, not specified as traumatic: Secondary | ICD-10-CM

## 2019-07-03 DIAGNOSIS — E7800 Pure hypercholesterolemia, unspecified: Secondary | ICD-10-CM | POA: Diagnosis not present

## 2019-07-03 DIAGNOSIS — E034 Atrophy of thyroid (acquired): Secondary | ICD-10-CM | POA: Diagnosis not present

## 2019-07-03 DIAGNOSIS — K219 Gastro-esophageal reflux disease without esophagitis: Secondary | ICD-10-CM | POA: Diagnosis not present

## 2019-07-03 DIAGNOSIS — I10 Essential (primary) hypertension: Secondary | ICD-10-CM | POA: Diagnosis not present

## 2019-07-03 DIAGNOSIS — M19019 Primary osteoarthritis, unspecified shoulder: Secondary | ICD-10-CM

## 2019-07-03 DIAGNOSIS — Z1159 Encounter for screening for other viral diseases: Secondary | ICD-10-CM | POA: Diagnosis not present

## 2019-07-04 DIAGNOSIS — M25512 Pain in left shoulder: Secondary | ICD-10-CM | POA: Diagnosis not present

## 2019-07-12 DIAGNOSIS — E7800 Pure hypercholesterolemia, unspecified: Secondary | ICD-10-CM | POA: Diagnosis not present

## 2019-07-12 DIAGNOSIS — I10 Essential (primary) hypertension: Secondary | ICD-10-CM | POA: Diagnosis not present

## 2019-07-12 DIAGNOSIS — E034 Atrophy of thyroid (acquired): Secondary | ICD-10-CM | POA: Diagnosis not present

## 2019-07-12 DIAGNOSIS — F3341 Major depressive disorder, recurrent, in partial remission: Secondary | ICD-10-CM | POA: Diagnosis not present

## 2019-07-12 DIAGNOSIS — K219 Gastro-esophageal reflux disease without esophagitis: Secondary | ICD-10-CM | POA: Diagnosis not present

## 2019-07-23 DIAGNOSIS — F3342 Major depressive disorder, recurrent, in full remission: Secondary | ICD-10-CM | POA: Diagnosis not present

## 2019-07-23 DIAGNOSIS — F410 Panic disorder [episodic paroxysmal anxiety] without agoraphobia: Secondary | ICD-10-CM | POA: Diagnosis not present

## 2019-07-23 DIAGNOSIS — F411 Generalized anxiety disorder: Secondary | ICD-10-CM | POA: Diagnosis not present

## 2019-07-31 ENCOUNTER — Other Ambulatory Visit: Payer: Self-pay

## 2019-07-31 ENCOUNTER — Ambulatory Visit
Admission: RE | Admit: 2019-07-31 | Discharge: 2019-07-31 | Disposition: A | Discharge status: Home / Self Care | Payer: PPO | Source: Ambulatory Visit | Attending: Orthopedic Surgery | Admitting: Orthopedic Surgery

## 2019-07-31 DIAGNOSIS — M75102 Unspecified rotator cuff tear or rupture of left shoulder, not specified as traumatic: Secondary | ICD-10-CM

## 2019-07-31 DIAGNOSIS — M75122 Complete rotator cuff tear or rupture of left shoulder, not specified as traumatic: Secondary | ICD-10-CM | POA: Diagnosis not present

## 2019-07-31 DIAGNOSIS — M19019 Primary osteoarthritis, unspecified shoulder: Secondary | ICD-10-CM

## 2019-08-07 ENCOUNTER — Ambulatory Visit
Admission: RE | Admit: 2019-08-07 | Discharge: 2019-08-07 | Disposition: A | Discharge status: Home / Self Care | Payer: PPO | Source: Ambulatory Visit | Attending: Physician Assistant | Admitting: Physician Assistant

## 2019-08-07 ENCOUNTER — Other Ambulatory Visit: Payer: Self-pay | Admitting: Physician Assistant

## 2019-08-07 ENCOUNTER — Other Ambulatory Visit: Payer: Self-pay

## 2019-08-07 DIAGNOSIS — K5732 Diverticulitis of large intestine without perforation or abscess without bleeding: Secondary | ICD-10-CM | POA: Diagnosis not present

## 2019-08-07 DIAGNOSIS — R350 Frequency of micturition: Secondary | ICD-10-CM | POA: Diagnosis not present

## 2019-08-07 DIAGNOSIS — R1032 Left lower quadrant pain: Secondary | ICD-10-CM

## 2019-08-07 DIAGNOSIS — R3915 Urgency of urination: Secondary | ICD-10-CM | POA: Diagnosis not present

## 2019-08-07 DIAGNOSIS — Z9889 Other specified postprocedural states: Secondary | ICD-10-CM | POA: Diagnosis not present

## 2019-08-12 DIAGNOSIS — R202 Paresthesia of skin: Secondary | ICD-10-CM | POA: Diagnosis not present

## 2019-08-12 DIAGNOSIS — G629 Polyneuropathy, unspecified: Secondary | ICD-10-CM | POA: Insufficient documentation

## 2019-08-12 DIAGNOSIS — R200 Anesthesia of skin: Secondary | ICD-10-CM | POA: Diagnosis not present

## 2019-08-12 DIAGNOSIS — R29818 Other symptoms and signs involving the nervous system: Secondary | ICD-10-CM | POA: Diagnosis not present

## 2019-08-15 ENCOUNTER — Other Ambulatory Visit: Payer: Self-pay | Admitting: Neurology

## 2019-08-15 ENCOUNTER — Other Ambulatory Visit (HOSPITAL_COMMUNITY): Payer: Self-pay | Admitting: Neurology

## 2019-08-15 DIAGNOSIS — M5416 Radiculopathy, lumbar region: Secondary | ICD-10-CM

## 2019-08-26 DIAGNOSIS — S46012A Strain of muscle(s) and tendon(s) of the rotator cuff of left shoulder, initial encounter: Secondary | ICD-10-CM | POA: Diagnosis not present

## 2019-08-26 DIAGNOSIS — M7582 Other shoulder lesions, left shoulder: Secondary | ICD-10-CM | POA: Insufficient documentation

## 2019-08-28 ENCOUNTER — Ambulatory Visit
Admission: RE | Admit: 2019-08-28 | Discharge: 2019-08-28 | Disposition: A | Discharge status: Home / Self Care | Payer: PPO | Source: Ambulatory Visit | Attending: Neurology | Admitting: Neurology

## 2019-08-28 ENCOUNTER — Other Ambulatory Visit: Payer: Self-pay

## 2019-08-28 DIAGNOSIS — M5416 Radiculopathy, lumbar region: Secondary | ICD-10-CM | POA: Diagnosis not present

## 2019-08-28 DIAGNOSIS — M545 Low back pain: Secondary | ICD-10-CM | POA: Diagnosis not present

## 2019-09-11 ENCOUNTER — Other Ambulatory Visit: Payer: Self-pay | Admitting: Surgery

## 2019-09-12 ENCOUNTER — Other Ambulatory Visit: Payer: Self-pay

## 2019-09-12 ENCOUNTER — Encounter
Admission: RE | Admit: 2019-09-12 | Discharge: 2019-09-12 | Disposition: A | Discharge status: Home / Self Care | Payer: PPO | Source: Ambulatory Visit | Attending: Surgery | Admitting: Surgery

## 2019-09-12 HISTORY — DX: Anemia, unspecified: D64.9

## 2019-09-12 NOTE — Patient Instructions (Signed)
Your procedure is scheduled on: 09/24/19 Report to [place]. To find out your arrival time please call [phone] between 1PM - 3PM on 09/23/19.  Remember: Instructions that are not followed completely may result in serious medical risk, up to and including death, or upon the discretion of your surgeon and anesthesiologist your surgery may need to be rescheduled.     _X__ 1. Do not eat food after midnight the night before your procedure.                 No gum chewing or hard candies. You may drink clear liquids up to 2 hours                 before you are scheduled to arrive for your surgery- DO not drink clear                 liquids within 2 hours of the start of your surgery.                 Clear Liquids include:  water, apple juice without pulp, clear carbohydrate                 drink such as Clearfast or Gatorade, Black Coffee or Tea (Do not add                 anything to coffee or tea). Diabetics water only  __X__2.  On the morning of surgery brush your teeth with toothpaste and water, you                 may rinse your mouth with mouthwash if you wish.  Do not swallow any              toothpaste of mouthwash.     _X__ 3.  No Alcohol for 24 hours before or after surgery.   _X__ 4.  Do Not Smoke or use e-cigarettes For 24 Hours Prior to Your Surgery.                 Do not use any chewable tobacco products for at least 6 hours prior to                 surgery.  ____  5.  Bring all medications with you on the day of surgery if instructed.   __X__  6.  Notify your doctor if there is any change in your medical condition      (cold, fever, infections).     Do not wear jewelry, make-up, hairpins, clips or nail polish. Do not wear lotions, powders, or perfumes.  Do not shave 48 hours prior to surgery. Men may shave face and neck. Do not bring valuables to the hospital.    [facility] is not responsible for any belongings or  valuables.  Contacts, dentures/partials or body piercings may not be worn into surgery. Bring a case for your contacts, glasses or hearing aids, a denture cup will be supplied. Leave your suitcase in the car. After surgery it may be brought to your room. For patients admitted to the hospital, discharge time is determined by your treatment team.   Patients discharged the day of surgery will not be allowed to drive home.   Please read over the following fact sheets that you were given:   MRSA Information  __X__ Take these medicines the morning of surgery with A SIP OF WATER:  1. metoprolol succinate (TOPROL-XL) 100 MG 24 hr tablet  2. levothyroxine (SYNTHROID, LEVOTHROID) 100 MCG tablet  3. sertraline (ZOLOFT) 50 MG tablet  4.  5.  6.  ____ Fleet Enema (as directed)   __X__ Use CHG Soap/SAGE wipes as directed  ____ Use inhalers on the day of surgery  ____ Stop metformin/Janumet/Farxiga 2 days prior to surgery    ____ Take 1/2 of usual insulin dose the night before surgery. No insulin the morning          of surgery.   ____ Stop Blood Thinners Coumadin/Plavix/Xarelto/Pleta/Pradaxa/Eliquis/Effient/Aspirin  on   Or contact your Surgeon, Cardiologist or Medical Doctor regarding  ability to stop your blood thinners  __X__ Stop Anti-inflammatories 7 days before surgery such as Advil, Ibuprofen, Motrin,  BC or Goodies Powder, Naprosyn, Naproxen, Aleve, Aspirin    __X__ Stop all herbal supplements, fish oil or vitamin E until after surgery. STOP GLUCOSAMINE/CHONDROITIN 09/16/19   ____ Bring C-Pap to the hospital.     How to Use an Incentive Spirometer  An incentive spirometer is a tool that measures how well you are filling your lungs with each breath. Learning to take long, deep breaths using this tool can help you keep your lungs clear and active. This may help to reverse or lessen your chance of developing breathing (pulmonary) problems, especially infection. You may be asked to  use a spirometer:  After a surgery.  If you have a lung problem or a history of smoking.  After a long period of time when you have been unable to move or be active. If the spirometer includes an indicator to show the highest number that you have reached, your health care provider or respiratory therapist will help you set a goal. Keep a list (log) of your progress as told by your health care provider. What are the risks?  Breathing too quickly may cause dizziness or cause you to pass out. Take your time so you do not get dizzy or light-headed.  If you are in pain, you may need to take pain medicine before doing incentive spirometry. It is harder to take a deep breath if you are having pain. How to use your incentive spirometer  1. Sit up on the edge of your bed or on a chair. 2. Hold the incentive spirometer so that it is in an upright position. 3. Before you use the spirometer, breathe out normally. 4. Place the mouthpiece in your mouth. Make sure your lips are closed tightly around it. 5. Breathe in slowly and as deeply as you can through your mouth, causing the piston or the ball to rise toward the top of the chamber. 6. Hold your breath for 3-5 seconds, or for as long as possible. ? If the spirometer includes a coach indicator, use this to guide you in breathing. Slow down your breathing if the indicator goes above the marked areas. 7. Remove the mouthpiece from your mouth and breathe out normally. The piston or ball will return to the bottom of the chamber. 8. Rest for a few seconds, then repeat the steps 10 or more times. ? Take your time and take a few normal breaths between deep breaths so that you do not get dizzy or light-headed. ? Do this every 1-2 hours when you are awake. 9. If the spirometer includes a goal marker to show the highest number you have reached (best effort), use this as a goal to work toward during each repetition. 10. After each set  of 10 deep breaths, cough a  few times. This will help to make sure that your lungs are clear. ? If you have an incision on your chest or abdomen from surgery, place a pillow or a rolled-up towel firmly against the incision when you cough. This can help to reduce pain from coughing. General tips  When you become able to get out of bed, walk around often and continue to cough to help clear your lungs.  Keep using the incentive spirometer until your health care provider says it is okay to stop using it. If you have been in the hospital, you may be told to keep using the spirometer at home. Contact a health care provider if:  You are having difficulty using the spirometer.  You have trouble using the spirometer as often as instructed.  Your pain medicine is not giving enough relief for you to use the spirometer as told.  You have a fever.  You develop shortness of breath. Get help right away if:  You develop a cough with bloody mucus from the lungs (bloody sputum).  You have fluid or blood coming from an incision site after you cough. Summary  An incentive spirometer is a tool that can help you learn to take long, deep breaths to keep your lungs clear and active.  You may be asked to use a spirometer after a surgery, if you have a lung problem or a history of smoking, or if you have been inactive for a long period of time.  Use your incentive spirometer as instructed every 1-2 hours while you are awake.  If you have an incision on your chest or abdomen, place a pillow or a rolled-up towel firmly against your incision when you cough. This will help to reduce pain. This information is not intended to replace advice given to you by your health care provider. Make sure you discuss any questions you have with your health care provider. Document Revised: 11/23/2018 Document Reviewed: 03/08/2017 Elsevier Patient Education  2020 [employer].

## 2019-09-13 ENCOUNTER — Encounter
Admission: RE | Admit: 2019-09-13 | Discharge: 2019-09-13 | Disposition: A | Discharge status: Home / Self Care | Payer: PPO | Source: Ambulatory Visit | Attending: Surgery | Admitting: Surgery

## 2019-09-13 DIAGNOSIS — Z01818 Encounter for other preprocedural examination: Secondary | ICD-10-CM | POA: Diagnosis not present

## 2019-09-13 DIAGNOSIS — I10 Essential (primary) hypertension: Secondary | ICD-10-CM | POA: Diagnosis not present

## 2019-09-20 ENCOUNTER — Other Ambulatory Visit: Payer: Self-pay

## 2019-09-20 ENCOUNTER — Other Ambulatory Visit
Admission: RE | Admit: 2019-09-20 | Discharge: 2019-09-20 | Disposition: A | Discharge status: Home / Self Care | Payer: PPO | Source: Ambulatory Visit | Attending: Surgery | Admitting: Surgery

## 2019-09-20 DIAGNOSIS — Z01812 Encounter for preprocedural laboratory examination: Secondary | ICD-10-CM | POA: Diagnosis not present

## 2019-09-20 DIAGNOSIS — Z20822 Contact with and (suspected) exposure to covid-19: Secondary | ICD-10-CM | POA: Insufficient documentation

## 2019-09-21 LAB — SARS CORONAVIRUS 2 (TAT 6-24 HRS): SARS Coronavirus 2: NEGATIVE

## 2019-09-23 ENCOUNTER — Encounter: Payer: Self-pay | Admitting: Surgery

## 2019-09-23 DIAGNOSIS — S46012D Strain of muscle(s) and tendon(s) of the rotator cuff of left shoulder, subsequent encounter: Secondary | ICD-10-CM | POA: Diagnosis not present

## 2019-09-23 DIAGNOSIS — M7582 Other shoulder lesions, left shoulder: Secondary | ICD-10-CM | POA: Diagnosis not present

## 2019-09-24 ENCOUNTER — Other Ambulatory Visit: Payer: Self-pay

## 2019-09-24 ENCOUNTER — Ambulatory Visit: Payer: PPO

## 2019-09-24 ENCOUNTER — Ambulatory Visit
Admission: RE | Admit: 2019-09-24 | Discharge: 2019-09-24 | Disposition: A | Discharge status: Home / Self Care | Payer: PPO | Attending: Surgery | Admitting: Surgery

## 2019-09-24 ENCOUNTER — Ambulatory Visit: Payer: PPO | Admitting: Certified Registered Nurse Anesthetist

## 2019-09-24 ENCOUNTER — Encounter
Admission: RE | Disposition: A | Discharge status: Home / Self Care | Payer: Self-pay | Source: Home / Self Care | Attending: Surgery

## 2019-09-24 ENCOUNTER — Encounter: Payer: Self-pay | Admitting: Surgery

## 2019-09-24 DIAGNOSIS — Z87891 Personal history of nicotine dependence: Secondary | ICD-10-CM | POA: Diagnosis not present

## 2019-09-24 DIAGNOSIS — M7522 Bicipital tendinitis, left shoulder: Secondary | ICD-10-CM | POA: Diagnosis not present

## 2019-09-24 DIAGNOSIS — Z881 Allergy status to other antibiotic agents status: Secondary | ICD-10-CM | POA: Diagnosis not present

## 2019-09-24 DIAGNOSIS — M7582 Other shoulder lesions, left shoulder: Secondary | ICD-10-CM | POA: Diagnosis not present

## 2019-09-24 DIAGNOSIS — Z79890 Hormone replacement therapy (postmenopausal): Secondary | ICD-10-CM | POA: Insufficient documentation

## 2019-09-24 DIAGNOSIS — Z882 Allergy status to sulfonamides status: Secondary | ICD-10-CM | POA: Diagnosis not present

## 2019-09-24 DIAGNOSIS — Z880 Allergy status to penicillin: Secondary | ICD-10-CM | POA: Diagnosis not present

## 2019-09-24 DIAGNOSIS — S46012A Strain of muscle(s) and tendon(s) of the rotator cuff of left shoulder, initial encounter: Secondary | ICD-10-CM | POA: Diagnosis not present

## 2019-09-24 DIAGNOSIS — E785 Hyperlipidemia, unspecified: Secondary | ICD-10-CM | POA: Diagnosis not present

## 2019-09-24 DIAGNOSIS — M25512 Pain in left shoulder: Secondary | ICD-10-CM | POA: Diagnosis not present

## 2019-09-24 DIAGNOSIS — G8918 Other acute postprocedural pain: Secondary | ICD-10-CM | POA: Diagnosis not present

## 2019-09-24 DIAGNOSIS — Z888 Allergy status to other drugs, medicaments and biological substances status: Secondary | ICD-10-CM | POA: Diagnosis not present

## 2019-09-24 DIAGNOSIS — E039 Hypothyroidism, unspecified: Secondary | ICD-10-CM | POA: Diagnosis not present

## 2019-09-24 DIAGNOSIS — M75122 Complete rotator cuff tear or rupture of left shoulder, not specified as traumatic: Secondary | ICD-10-CM | POA: Insufficient documentation

## 2019-09-24 DIAGNOSIS — F419 Anxiety disorder, unspecified: Secondary | ICD-10-CM | POA: Insufficient documentation

## 2019-09-24 DIAGNOSIS — Z419 Encounter for procedure for purposes other than remedying health state, unspecified: Secondary | ICD-10-CM

## 2019-09-24 DIAGNOSIS — Z79899 Other long term (current) drug therapy: Secondary | ICD-10-CM | POA: Insufficient documentation

## 2019-09-24 DIAGNOSIS — I10 Essential (primary) hypertension: Secondary | ICD-10-CM | POA: Diagnosis not present

## 2019-09-24 HISTORY — PX: SHOULDER ARTHROSCOPY WITH DEBRIDEMENT AND BICEP TENDON REPAIR: SHX5690

## 2019-09-24 SURGERY — SHOULDER ARTHROSCOPY WITH DEBRIDEMENT AND BICEP TENDON REPAIR
Anesthesia: General | Site: Shoulder | Laterality: Left

## 2019-09-24 MED ORDER — FENTANYL CITRATE (PF) 100 MCG/2ML IJ SOLN
INTRAMUSCULAR | Status: DC | PRN
  Administered 2019-09-24 (×2): 50 ug via INTRAVENOUS

## 2019-09-24 MED ORDER — GLYCOPYRROLATE 0.2 MG/ML IJ SOLN
INTRAMUSCULAR | Status: DC | PRN
  Administered 2019-09-24 (×2): .1 mg via INTRAVENOUS

## 2019-09-24 MED ORDER — PHENYLEPHRINE HCL (PRESSORS) 10 MG/ML IV SOLN: INTRAVENOUS | Status: DC | PRN

## 2019-09-24 MED ORDER — FENTANYL CITRATE (PF) 100 MCG/2ML IJ SOLN
INTRAMUSCULAR | Status: AC
  Filled 2019-09-24: qty 2

## 2019-09-24 MED ORDER — SODIUM CHLORIDE FLUSH 0.9 % IV SOLN
INTRAVENOUS | Status: AC
  Filled 2019-09-24: qty 40

## 2019-09-24 MED ORDER — BUPIVACAINE LIPOSOME 1.3 % IJ SUSP
INTRAMUSCULAR | Status: DC | PRN
  Administered 2019-09-24: 20 mL via PERINEURAL

## 2019-09-24 MED ORDER — LIDOCAINE HCL (PF) 1 % IJ SOLN
INTRAMUSCULAR | Status: AC
  Filled 2019-09-24: qty 5

## 2019-09-24 MED ORDER — DEXAMETHASONE SODIUM PHOSPHATE 10 MG/ML IJ SOLN
INTRAMUSCULAR | Status: DC | PRN
  Administered 2019-09-24: 10 mg via INTRAVENOUS

## 2019-09-24 MED ORDER — LIDOCAINE HCL (PF) 1 % IJ SOLN
INTRAMUSCULAR | Status: DC | PRN
Start: 2019-09-24 — End: 2019-09-24
  Administered 2019-09-24: .8 mL via SUBCUTANEOUS

## 2019-09-24 MED ORDER — CHLORHEXIDINE GLUCONATE 0.12 % MT SOLN
15.0000 mL | Freq: Once | OROMUCOSAL | Status: AC
  Administered 2019-09-24: 15 mL via OROMUCOSAL

## 2019-09-24 MED ORDER — EPINEPHRINE PF 1 MG/ML IJ SOLN
INTRAMUSCULAR | Status: AC
  Filled 2019-09-24: qty 1

## 2019-09-24 MED ORDER — MIDAZOLAM HCL 2 MG/2ML IJ SOLN
1.0000 mg | Freq: Once | INTRAMUSCULAR | Status: AC
  Administered 2019-09-24: 1 mg via INTRAVENOUS

## 2019-09-24 MED ORDER — ONDANSETRON HCL 4 MG/2ML IJ SOLN
INTRAMUSCULAR | Status: DC | PRN
  Administered 2019-09-24: 4 mg via INTRAVENOUS

## 2019-09-24 MED ORDER — PROPOFOL 10 MG/ML IV BOLUS
INTRAVENOUS | Status: DC | PRN
  Administered 2019-09-24: 140 mg via INTRAVENOUS

## 2019-09-24 MED ORDER — VANCOMYCIN HCL IN DEXTROSE 1-5 GM/200ML-% IV SOLN
INTRAVENOUS | Status: AC
  Filled 2019-09-24: qty 200

## 2019-09-24 MED ORDER — FENTANYL CITRATE (PF) 100 MCG/2ML IJ SOLN
50.0000 ug | Freq: Once | INTRAMUSCULAR | Status: AC
  Administered 2019-09-24: 50 ug via INTRAVENOUS

## 2019-09-24 MED ORDER — BUPIVACAINE HCL (PF) 0.5 % IJ SOLN
INTRAMUSCULAR | Status: DC | PRN
  Administered 2019-09-24: 10 mL via PERINEURAL

## 2019-09-24 MED ORDER — BUPIVACAINE HCL (PF) 0.25 % IJ SOLN
INTRAMUSCULAR | Status: AC
  Filled 2019-09-24: qty 30

## 2019-09-24 MED ORDER — BUPIVACAINE HCL (PF) 0.5 % IJ SOLN
INTRAMUSCULAR | Status: AC
  Filled 2019-09-24: qty 30

## 2019-09-24 MED ORDER — FENTANYL CITRATE (PF) 100 MCG/2ML IJ SOLN: 25.0000 ug | INTRAMUSCULAR | Status: DC | PRN

## 2019-09-24 MED ORDER — BUPIVACAINE-EPINEPHRINE 0.5% -1:200000 IJ SOLN
INTRAMUSCULAR | Status: DC | PRN
  Administered 2019-09-24: 30 mL

## 2019-09-24 MED ORDER — ONDANSETRON HCL 4 MG/2ML IJ SOLN: 4.0000 mg | Freq: Once | INTRAMUSCULAR | Status: DC | PRN

## 2019-09-24 MED ORDER — PHENYLEPHRINE HCL (PRESSORS) 10 MG/ML IV SOLN
INTRAVENOUS | Status: DC | PRN
  Administered 2019-09-24: 50 ug via INTRAVENOUS

## 2019-09-24 MED ORDER — EPHEDRINE SULFATE 50 MG/ML IJ SOLN
INTRAMUSCULAR | Status: DC | PRN
  Administered 2019-09-24: 10 mg via INTRAVENOUS

## 2019-09-24 MED ORDER — NEOMYCIN-POLYMYXIN B GU 40-200000 IR SOLN
Status: AC
  Filled 2019-09-24: qty 20

## 2019-09-24 MED ORDER — BUPIVACAINE HCL (PF) 0.5 % IJ SOLN
INTRAMUSCULAR | Status: AC
  Filled 2019-09-24: qty 10

## 2019-09-24 MED ORDER — CHLORHEXIDINE GLUCONATE 0.12 % MT SOLN
OROMUCOSAL | Status: AC
  Filled 2019-09-24: qty 15

## 2019-09-24 MED ORDER — EPHEDRINE 5 MG/ML INJ
INTRAVENOUS | Status: AC
  Filled 2019-09-24: qty 20

## 2019-09-24 MED ORDER — SODIUM CHLORIDE 0.9 % IV SOLN
INTRAVENOUS | Status: DC | PRN
  Administered 2019-09-24: 30 ug/min via INTRAVENOUS

## 2019-09-24 MED ORDER — BUPIVACAINE LIPOSOME 1.3 % IJ SUSP
INTRAMUSCULAR | Status: AC
  Filled 2019-09-24: qty 20

## 2019-09-24 MED ORDER — SUGAMMADEX SODIUM 200 MG/2ML IV SOLN
INTRAVENOUS | Status: DC | PRN
  Administered 2019-09-24: 200 mg via INTRAVENOUS

## 2019-09-24 MED ORDER — ROCURONIUM BROMIDE 100 MG/10ML IV SOLN
INTRAVENOUS | Status: DC | PRN
  Administered 2019-09-24: 10 mg via INTRAVENOUS
  Administered 2019-09-24: 50 mg via INTRAVENOUS

## 2019-09-24 MED ORDER — LIDOCAINE HCL (CARDIAC) PF 100 MG/5ML IV SOSY
PREFILLED_SYRINGE | INTRAVENOUS | Status: DC | PRN
  Administered 2019-09-24: 80 mg via INTRAVENOUS

## 2019-09-24 MED ORDER — LACTATED RINGERS IV SOLN: INTRAVENOUS | Status: DC

## 2019-09-24 MED ORDER — VANCOMYCIN HCL IN DEXTROSE 1-5 GM/200ML-% IV SOLN
1000.0000 mg | INTRAVENOUS | Status: AC
  Administered 2019-09-24: 1000 mg via INTRAVENOUS

## 2019-09-24 MED ORDER — FAMOTIDINE 20 MG PO TABS
ORAL_TABLET | ORAL | Status: AC
  Administered 2019-09-24: 20 mg
  Filled 2019-09-24: qty 1

## 2019-09-24 MED ORDER — PROPOFOL 10 MG/ML IV BOLUS
INTRAVENOUS | Status: AC
  Filled 2019-09-24: qty 20

## 2019-09-24 MED ORDER — MIDAZOLAM HCL 2 MG/2ML IJ SOLN
INTRAMUSCULAR | Status: AC
  Filled 2019-09-24: qty 2

## 2019-09-24 MED ORDER — ORAL CARE MOUTH RINSE: 15.0000 mL | Freq: Once | OROMUCOSAL | Status: AC

## 2019-09-24 MED ORDER — LACTATED RINGERS IV SOLN
INTRAVENOUS | Status: DC | PRN
  Administered 2019-09-24: 2000 mL

## 2019-09-24 MED ORDER — OXYCODONE HCL 5 MG PO TABS: 5.0000 mg | ORAL_TABLET | ORAL | 0 refills | Status: DC | PRN

## 2019-09-24 MED ORDER — EPHEDRINE 5 MG/ML INJ
INTRAVENOUS | Status: AC
  Filled 2019-09-24: qty 10

## 2019-09-24 SURGICAL SUPPLY — 52 items
ANCHOR BONE REGENETEN (Anchor) ×2 IMPLANT
ANCHOR HEALICOIL REGEN 5.5 (Anchor) ×4 IMPLANT
ANCHOR JUGGERKNOT WTAP NDL 2.9 (Anchor) ×6 IMPLANT
ANCHOR TENDON REGENETEN (Staple) ×2 IMPLANT
BIT DRILL JUGRKNT W/NDL BIT2.9 (DRILL) ×1 IMPLANT
BLADE FULL RADIUS 3.5 (BLADE) ×2 IMPLANT
BUR ACROMIONIZER 4.0 (BURR) ×2 IMPLANT
CANNULA SHAVER 8MMX76MM (CANNULA) ×2 IMPLANT
CHLORAPREP W/TINT 26 (MISCELLANEOUS) ×2 IMPLANT
COVER MAYO STAND REUSABLE (DRAPES) ×2 IMPLANT
COVER WAND RF STERILE (DRAPES) ×2 IMPLANT
DILATOR 5.5 THREADED HEALICOIL (MISCELLANEOUS) ×2 IMPLANT
DRAPE IMP U-DRAPE 54X76 (DRAPES) ×4 IMPLANT
DRILL JUGGERKNOT W/NDL BIT 2.9 (DRILL) ×2
ELECT CAUTERY BLADE TIP 2.5 (TIP) ×2
ELECT REM PT RETURN 9FT ADLT (ELECTROSURGICAL) ×2
ELECTRODE CAUTERY BLDE TIP 2.5 (TIP) ×1 IMPLANT
ELECTRODE REM PT RTRN 9FT ADLT (ELECTROSURGICAL) ×1 IMPLANT
GAUZE SPONGE 4X4 12PLY STRL (GAUZE/BANDAGES/DRESSINGS) ×2 IMPLANT
GAUZE XEROFORM 1X8 LF (GAUZE/BANDAGES/DRESSINGS) ×2 IMPLANT
GLOVE BIO SURGEON STRL SZ7.5 (GLOVE) ×6 IMPLANT
GLOVE BIO SURGEON STRL SZ8 (GLOVE) ×4 IMPLANT
GLOVE BIOGEL PI IND STRL 8 (GLOVE) ×1 IMPLANT
GLOVE BIOGEL PI INDICATOR 8 (GLOVE) ×1
GLOVE INDICATOR 8.0 STRL GRN (GLOVE) ×2 IMPLANT
GOWN STRL REUS W/ TWL LRG LVL3 (GOWN DISPOSABLE) ×1 IMPLANT
GOWN STRL REUS W/ TWL XL LVL3 (GOWN DISPOSABLE) ×1 IMPLANT
GOWN STRL REUS W/TWL LRG LVL3 (GOWN DISPOSABLE) ×1
GOWN STRL REUS W/TWL XL LVL3 (GOWN DISPOSABLE) ×1
GRASPER SUT 15 45D LOW PRO (SUTURE) IMPLANT
IMPL REGENETEN MEDIUM (Shoulder) ×1 IMPLANT
IMPLANT REGENETEN MEDIUM (Shoulder) ×2 IMPLANT
IV LACTATED RINGER IRRG 3000ML (IV SOLUTION) ×2
IV LR IRRIG 3000ML ARTHROMATIC (IV SOLUTION) ×1 IMPLANT
KIT CANNULA 8X76-LX IN CANNULA (CANNULA) IMPLANT
MANIFOLD NEPTUNE II (INSTRUMENTS) ×2 IMPLANT
MASK FACE SPIDER DISP (MASK) ×2 IMPLANT
MAT ABSORB  FLUID 56X50 GRAY (MISCELLANEOUS) ×2
MAT ABSORB FLUID 56X50 GRAY (MISCELLANEOUS) ×1 IMPLANT
PACK SHDR ARTHRO (MISCELLANEOUS) ×2 IMPLANT
PASSER SUT FIRSTPASS SELF (INSTRUMENTS) IMPLANT
SLING ARM LRG DEEP (SOFTGOODS) ×2 IMPLANT
SLING ULTRA II LG (MISCELLANEOUS) ×2 IMPLANT
STAPLER SKIN PROX 35W (STAPLE) ×2 IMPLANT
STRAP SAFETY 5IN WIDE (MISCELLANEOUS) ×2 IMPLANT
SUT ETHIBOND 0 MO6 C/R (SUTURE) ×2 IMPLANT
SUT ULTRABRAID 2 COBRAID 38 (SUTURE) IMPLANT
SUT VIC AB 2-0 CT1 27 (SUTURE) ×2
SUT VIC AB 2-0 CT1 TAPERPNT 27 (SUTURE) ×2 IMPLANT
TAPE MICROFOAM 4IN (TAPE) ×2 IMPLANT
TUBING ARTHRO INFLOW-ONLY STRL (TUBING) ×2 IMPLANT
WAND WEREWOLF FLOW 90D (MISCELLANEOUS) ×2 IMPLANT

## 2019-09-24 NOTE — Anesthesia Procedure Notes (Addendum)
Procedure Name: Intubation Date/Time: 09/24/2019 9:58 AM Performed by: Lily Peer, Summer, RN Pre-anesthesia Checklist: Patient identified, Emergency Drugs available, Suction available and Patient being monitored Patient Re-evaluated:Patient Re-evaluated prior to induction Oxygen Delivery Method: Circle system utilized Preoxygenation: Pre-oxygenation with 100% oxygen Induction Type: IV induction Ventilation: Mask ventilation without difficulty and Oral airway inserted - appropriate to patient size Laryngoscope Size: McGraph and 3 Grade View: Grade I Tube type: Oral Number of attempts: 1 Airway Equipment and Method: Stylet and Oral airway Placement Confirmation: ETT inserted through vocal cords under direct vision,  positive ETCO2 and breath sounds checked- equal and bilateral Secured at: 21 cm Tube secured with: Tape Dental Injury: Teeth and Oropharynx as per pre-operative assessment

## 2019-09-24 NOTE — Anesthesia Preprocedure Evaluation (Addendum)
Anesthesia Evaluation  Patient identified by MRN, date of birth, ID band Patient awake    Reviewed: Allergy & Precautions, NPO status , Patient's Chart, lab work & pertinent test results  History of Anesthesia Complications (+) history of anesthetic complications (PONV only 1 episode with numerous surgeries)  Airway Mallampati: II       Dental   Pulmonary neg sleep apnea, neg COPD, Not current smoker, former smoker,           Cardiovascular hypertension, Pt. on medications (-) Past MI and (-) CHF (-) dysrhythmias (-) Valvular Problems/Murmurs     Neuro/Psych Anxiety    GI/Hepatic Neg liver ROS, GERD  Medicated and Controlled,  Endo/Other  neg diabetesHypothyroidism   Renal/GU negative Renal ROS     Musculoskeletal   Abdominal   Peds  Hematology  (+) anemia ,   Anesthesia Other Findings   Reproductive/Obstetrics                            Anesthesia Physical Anesthesia Plan  ASA: II  Anesthesia Plan: General   Post-op Pain Management:    Induction: Intravenous  PONV Risk Score and Plan: 3 and Ondansetron, Dexamethasone, Midazolam and Treatment may vary due to age or medical condition  Airway Management Planned: Oral ETT  Additional Equipment:   Intra-op Plan:   Post-operative Plan:   Informed Consent: I have reviewed the patients History and Physical, chart, labs and discussed the procedure including the risks, benefits and alternatives for the proposed anesthesia with the patient or authorized representative who has indicated his/her understanding and acceptance.       Plan Discussed with:   Anesthesia Plan Comments:         Anesthesia Quick Evaluation

## 2019-09-24 NOTE — Anesthesia Postprocedure Evaluation (Signed)
Anesthesia Post Note  Patient: Mary Elliott  Procedure(s) Performed: Left shoulder arthroscopy with debridement, decompression, rotator cuff repair, and possible biceps tenodesis. (Left Shoulder)  Patient location during evaluation: PACU Anesthesia Type: General Level of consciousness: awake and alert Pain management: pain level controlled Vital Signs Assessment: post-procedure vital signs reviewed and stable Respiratory status: spontaneous breathing and respiratory function stable Cardiovascular status: stable Anesthetic complications: no     Last Vitals:  Vitals:   09/24/19 1158 09/24/19 1201  BP:  130/83  Pulse:  71  Resp:  19  Temp:    SpO2: 92% 91%    Last Pain:  Vitals:   09/24/19 1201  TempSrc:   PainSc: 0-No pain                 Cheri Ayotte K

## 2019-09-24 NOTE — Transfer of Care (Signed)
Immediate Anesthesia Transfer of Care Note  Patient: Draven Towery  Procedure(s) Performed: Left shoulder arthroscopy with debridement, decompression, rotator cuff repair, and possible biceps tenodesis. (Left Shoulder)  Patient Location: PACU  Anesthesia Type:General  Level of Consciousness: awake and patient cooperative  Airway & Oxygen Therapy: Patient Spontanous Breathing and Patient connected to face mask oxygen  Post-op Assessment: Report given to RN and Post -op Vital signs reviewed and stable  Post vital signs: Reviewed and stable  Last Vitals:  Vitals Value Taken Time  BP 136/82 09/24/19 1145  Temp 36.3 C 09/24/19 1145  Pulse 69 09/24/19 1149  Resp 18 09/24/19 1149  SpO2 99 % 09/24/19 1149  Vitals shown include unvalidated device data.  Last Pain:  Vitals:   09/24/19 0826  TempSrc:   PainSc: 0-No pain         Complications: No apparent anesthesia complications

## 2019-09-24 NOTE — Discharge Instructions (Addendum)
Orthopedic discharge instructions: Keep dressing dry and intact.  May shower after dressing changed on post-op day #4 (Saturday).  Cover staples with Band-Aids after drying off. Apply ice frequently to shoulder. Take oxycodone as prescribed when needed.  May supplement with ES Tylenol if necessary. Keep shoulder immobilizer on at all times except may remove for bathing purposes. Follow-up in 10-14 days or as scheduled.   AMBULATORY SURGERY  DISCHARGE INSTRUCTIONS   1) The drugs that you were given will stay in your system until tomorrow so for the next 24 hours you should not:  A) Drive an automobile B) Make any legal decisions C) Drink any alcoholic beverage   2) You may resume regular meals tomorrow.  Today it is better to start with liquids and gradually work up to solid foods.  You may eat anything you prefer, but it is better to start with liquids, then soup and crackers, and gradually work up to solid foods.   3) Please notify your doctor immediately if you have any unusual bleeding, trouble breathing, redness and pain at the surgery site, drainage, fever, or pain not relieved by medication.    4) Additional Instructions:        Please contact your physician with any problems or Same Day Surgery at [phone], Monday through Friday 6 am to 4 pm, or [place] at [facility] number at [phone].       Interscalene Nerve Block with Exparel  1.  For your surgery you have received an Interscalene Nerve Block with Exparel. 2. Nerve Blocks affect many types of nerves, including nerves that control movement, pain and normal sensation.  You may experience feelings such as numbness, tingling, heaviness, weakness or the inability to move your arm or the feeling or sensation that your arm has "fallen asleep". 3. A nerve block with Exparel can last up to 5 days.  Usually the weakness wears off first.  The tingling and heaviness usually wear off next.  Finally you may  start to notice pain.  Keep in mind that this may occur in any order.  Once a nerve block starts to wear off it is usually completely gone within 60 minutes. 4. ISNB may cause mild shortness of breath, a hoarse voice, blurry vision, unequal pupils, or drooping of the face on the same side as the nerve block.  These symptoms will usually resolve with the numbness.  Very rarely the procedure itself can cause mild seizures. 5. If needed, your surgeon will give you a prescription for pain medication.  It will take about 60 minutes for the oral pain medication to become fully effective.  So, it is recommended that you start taking this medication before the nerve block first begins to wear off, or when you first begin to feel discomfort. 6. Take your pain medication only as prescribed.  Pain medication can cause sedation and decrease your breathing if you take more than you need for the level of pain that you have. 7. Nausea is a common side effect of many pain medications.  You may want to eat something before taking your pain medicine to prevent nausea. 8. After an Interscalene nerve block, you cannot feel pain, pressure or extremes in temperature in the effected arm.  Because your arm is numb it is at an increased risk for injury.  To decrease the possibility of injury, please practice the following:  a. While you are awake change the position of your arm frequently to prevent too much pressure  on any one area for prolonged periods of time. b.  If you have a cast or tight dressing, check the color or your fingers every couple of hours.  Call your surgeon with the appearance of any discoloration (white or blue). c. If you are given a sling to wear before you go home, please wear it  at all times until the block has completely worn off.  Do not get up at night without your sling. d. Please contact [facility] or your surgeon if you do not begin to regain sensation after 7 days from the surgery.  Anesthesia  may be contacted by calling the Same Day Surgery Department, Mon. through Fri., 6 am to 4 pm at [phone].   e. If you experience any other problems or concerns, please contact your surgeon's office. f. If you experience severe or prolonged shortness of breath go to the nearest emergency department.

## 2019-09-24 NOTE — Op Note (Addendum)
09/24/2019  11:35 AM  Patient:   Mary Elliott  Pre-Op Diagnosis:   Traumatic full-thickness rotator cuff tear, left shoulder.  Post-Op Diagnosis:   Traumatic full-thickness rotator cuff tear with degenerative labral fraying and biceps tendinopathy, left shoulder.  Procedure:   Limited arthroscopic debridement, arthroscopic subacromial decompression, mini-open rotator cuff repair using [employer] Regeneten patch, and mini-open biceps tenodesis, left shoulder.  Anesthesia:   General endotracheal with interscalene block using Exparel placed preoperatively by the anesthesiologist.  Surgeon:   Pascal Lux, MD  Assistant:   Cameron Proud, PA-C  Findings:   As above. The labrum demonstrated areas of degenerative fraying involving the posterior and posterosuperior portions without evidence for detachment from the glenoid rim. There was a full-thickness tear involving the anterior and mid insertional fibers of the supraspinatus tendon measuring approximately 1.5 x 1 cm. The remainder the rotator cuff was in satisfactory condition. There were areas of inflammation on the biceps tendon without partial or full-thickness tearing. The articular surfaces of the glenoid and humerus both were in satisfactory condition.  Complications:   None  Fluids:   500 cc  Estimated blood loss:   10 cc  Tourniquet time:   None  Drains:   None  Closure:   Staples      Brief clinical note:   The patient is a 79 year old female with a history of left shoulder pain following a trip and fall injury nearly 6 months ago. The patient's symptoms have progressed despite medications, activity modification, etc. The patient's history and examination are consistent with a rotator cuff tear. These findings were confirmed by MRI scan. The patient presents at this time for definitive management of these shoulder symptoms.  Procedure:   The patient underwent placement of an interscalene block using Exparel by the  anesthesiologist in the preoperative holding area before being brought into the operating room and lain in the supine position. The patient then underwent general endotracheal intubation and anesthesia before being repositioned in the beach chair position using the beach chair positioner. The left shoulder and upper extremity were prepped with ChloraPrep solution before being draped sterilely. Preoperative antibiotics were administered. A timeout was performed to confirm the proper surgical site before the expected portal sites and incision site were injected with 0.5% Sensorcaine with epinephrine. A posterior portal was created and the glenohumeral joint thoroughly inspected with the findings as described above. An anterior portal was created using an outside-in technique. The labrum and rotator cuff were further probed, again confirming the above-noted findings. The area of degenerative labral fraying as well as the torn edge of the rotator cuff were debrided back to stable margins using the full-radius resector, as were several areas of synovitis. The ArthroCare wand was inserted and used to release the biceps tendon from its labral anchor. It also was used to obtain hemostasis as well as to "anneal" the labrum superiorly and posteriorly. The instruments were removed from the joint after suctioning the excess fluid.  The camera was repositioned through the posterior portal into the subacromial space. A separate lateral portal was created using an outside-in technique. The 3.5 mm full-radius resector was introduced and used to perform a subtotal bursectomy. The ArthroCare wand was then inserted and used to remove the periosteal tissue off the undersurface of the anterior third of the acromion as well as to recess the coracoacromial ligament from its attachment along the anterior and lateral margins of the acromion. The 4.0 mm acromionizing bur was introduced and used  to complete the decompression by removing the  undersurface of the anterior third of the acromion. The full radius resector was reintroduced to remove any residual bony debris before the ArthroCare wand was reintroduced to obtain hemostasis. The instruments were then removed from the subacromial space after suctioning the excess fluid.  An approximately 4-5 cm incision was made over the anterolateral aspect of the shoulder beginning at the anterolateral corner of the acromion and extending distally in line with the bicipital groove. This incision was carried down through the subcutaneous tissues to expose the deltoid fascia. The raphae between the anterior and middle thirds was identified and this plane developed to provide access into the subacromial space. Additional bursal tissues were debrided sharply using Metzenbaum scissors. The rotator cuff tear was readily identified. The margins were debrided sharply with a #15 blade and the exposed greater tuberosity roughened with a rongeur. The tear was repaired using two Biomet 2.9 mm JuggerKnot anchors. These sutures were then brought back laterally and secured using two [place] Regenerex knotless anchors to create a two-layer closure. Given the patient's age and rotator cuff tissue quality, it was felt best to augment this repair with a [place] patch. Therefore, a medium patch was selected and secured over the repair using the appropriate bone and soft tissue staples. An apparent watertight closure was obtained.  The bicipital groove was identified by palpation and opened for 1-1.5 cm. The biceps tendon stump was retrieved through this defect. The floor of the bicipital groove was roughened with a curet before another Biomet 2.9 mm JuggerKnot anchor was inserted. Both sets of sutures were passed through the biceps tendon and tied securely to effect the tenodesis. The bicipital sheath was reapproximated using two #0 Ethibond interrupted sutures, incorporating the biceps tendon  to further reinforce the tenodesis.  The wound was copiously irrigated with sterile saline solution before the deltoid raphae was reapproximated using 2-0 Vicryl interrupted sutures. The subcutaneous tissues were closed in two layers using 2-0 Vicryl interrupted sutures before the skin was closed using staples. The portal sites also were closed using staples. A sterile bulky dressing was applied to the shoulder before the arm was placed into a shoulder immobilizer. The patient was then awakened, extubated, and returned to the recovery room in satisfactory condition after tolerating the procedure well.

## 2019-09-24 NOTE — H&P (Signed)
History of Present Illness:  Mary Elliott is a 79 y.o. female who presents today as a result of a referral from Rachelle Hora, PA-C, for left shoulder pain and weakness.  The patient's symptoms began in December, 2020, and developed as the result of a fall at home. Initially, she did not seek treatment, choosing to try to manage the symptoms on her own. However, because of increased discomfort and concern for developing a frozen shoulder, she went to her primary care provider who started her in some physical therapy. Despite a month or so of therapy, her symptoms did not improve substantially, so she was referred to orthopedics for further evaluation and treatment. She saw Rachelle Hora, PA-C, who sent her for an MRI scan and referred her to me for further evaluation and treatment. The patient describes the symptoms as moderate (patient is active but has had to make modifications or give up activities) and have the quality of being aching, nagging, stabbing and throbbing. The pain is localized to the lateral arm/shoulder. These symptoms are aggravated with normal daily activities, with sleeping, carrying heavy objects, at higher levels of activity, with overhead activity and reaching behind the back. She has tried acetaminophen with limited benefit. She has tried physical therapy and rest with limited benefit. She has not tried any physical therapy or steroid injections for the symptoms. The patient denies any prior problems with the left shoulder. She denies any neck pain, nor does she note any numbness or paresthesias down her arm to her hand.  This complaint is not work related. She is a sports non-participant.  Shoulder Surgical History:  The patient has had no shoulder surgery in the past.  PMH/PSH/Family History/Social History/Meds/Allergies:  I have reviewed past medical, surgical, social and family history, medications and allergies as documented in the EMR.  Current Outpatient  Medications: . acetaminophen (TYLENOL EXTRA STRENGTH) 500 MG tablet Take 500 mg by mouth 2 (two) times daily as needed for Pain  . BLADDER CONTROL PADS Pads once daily as needed.  . cholecalciferol (VITAMIN D3) 2,000 unit capsule Take 2,000 Units by mouth daily.  . cyanocobalamin (VITAMIN B12) 1000 MCG tablet Take 1,000 mcg by mouth once daily.  . diazePAM (VALIUM) 2 MG tablet Take one tablet 30 minutes before MRI and then repeat if needed immediately before MRI. You will need a driver. 2 tablet 0  . gabapentin (NEURONTIN) 100 MG capsule Take 100 mg twice a day for one week, then increase to 200 mg(2 tablets) twice a day and continue 120 capsule 3  . glucosamine-chondroitin 500-400 mg capsule Take 2 capsules by mouth daily.  . Lactobacillus acidophilus (PROBIOTIC ORAL) Take by mouth One daily  . levothyroxine (SYNTHROID) 100 MCG tablet Take 1 tablet (100 mcg total) by mouth once daily Take on an empty stomach with a glass of water at least 30-60 minutes before breakfast. 90 tablet 3  . loratadine (CLARITIN) 10 mg tablet Take 10 mg by mouth once daily As needed  . metoprolol succinate (TOPROL-XL) 100 MG XL tablet Take 1 tablet (100 mg total) by mouth every morning 90 tablet 1  . metroNIDAZOLE (METROGEL) 1 % gel once daily as needed 5  . sertraline (ZOLOFT) 50 MG tablet Take 50 mg by mouth once daily  . valACYclovir (VALTREX) 1000 MG tablet Take 2 tablets by mouth every 12 (twelve) hours as needed  . estradiol (ESTRACE) 0.01 % (0.1 mg/gram) vaginal cream Place 1 g vaginally twice a week Apply one gram (1/4 applicator) in  the vagina twice weekly. 42.5 g 4   No current Epic-ordered facility-administered medications on file.   Allergies:  Allergen Reactions  . Macrodantin [Nitrofurantoin Macrocrystalline] Hives  . Amoxicillin Itching  . Amlodipine Other (See Comments)  edema  . Clindamycin Itching  . Cymbalta [Duloxetine] Other (See Comments)  Unable to empty bladder  . Nsaids (Non-Steroidal  Anti-Inflammatory Drug) Other (See Comments)  Prior HX of ulcers  . Sulfa (Sulfonamide Antibiotics) Other (See Comments)  Felt cloudy and couldn't move  . Tegaserod Other (See Comments)  Joint pain . Tramadol Itching   Past Medical History:  . Anxiety  . Benign hypertension  . Diverticulitis 07/23/2013  On colonoscopy, asymptomatic  . Diverticulosis  on Colonoscopy, asymptomatic  . Esophageal reflux  . Hemorrhoid  . Hyperlipidemia  . Hypothyroidism  . Incontinence of urine in female  . Kidney stones  . Palpitations 07/23/2013  Evaluated by Dr. Ubaldo Glassing. Improved with treatment for anxiety  . PONV (postoperative nausea and vomiting)  only once  . Posterior vitreous detachment of left eye  . Sinoatrial node dysfunction (CMS-HCC)  . Tinnitus   Past Surgical History:  . basal cell carcinoma removed from face  . Blethoplasty  [id number]  . COLPOPEXY FOR SUSPENSION UTEROSACRUM INTRAPERITONEAL N/A 03/19/2013  Procedure: COLPOPEXY FOR SUSPENSION UTEROSACRUM INTRAPERITONEAL; Surgeon: Delanna Notice, MD; Location: [place]; Service: Gynecology; Laterality: N/A;  . COLPORRHAPHY FOR REPAIR CYSTOCELE ANTERIOR  . COLPORRHAPHY FOR REPAIR CYSTOCELE ANTERIOR N/A 03/19/2013  Procedure: COLPORRHAPHY FOR REPAIR CYSTOCELE ANTERIOR; Surgeon: Delanna Notice, MD; Location: [place]; Service: Gynecology; Laterality: N/A;  . CYSTOURETHROSCOPY N/A 03/19/2013  Procedure: CYSTOURETHROSCOPY; Surgeon: Delanna Notice, MD; Location: [place]; Service: Gynecology; Laterality: N/A;  . facial laser peel, Botox, collagen, radiant flips  1998 thru 2005  . HEMORRHOIDECTOMY EXTERNAL  x2-1968/1988  . HYSTERECTOMY VAGINAL N/A 03/19/2013  Procedure: HYSTERECTOMY VAGINAL; Surgeon: Delanna Notice, MD; Location: [place]; Service: Gynecology; Laterality: N/A;  . KNEE ARTHROSCOPY  left knee  . Laser surgery bilateral saphenous veins  2007  . Left total knee arthroplasty using  computer-assisted navigation 11/14/2016  Dr Marry Guan  . LIPOSUCTION EXTREMITIES  lower torso and legs  . Mid Uretheral Sling  . REPAIR VAGINAL PROLAPSE W/WO SACROSPINOUS LIGAMENT SUSPENSION N/A 03/19/2013  Procedure: REPAIR VAGINAL PROLAPSE W/WO SACROSPINOUS LIGAMENT SUSPENSION; Surgeon: Delanna Notice, MD; Location: [place]; Service: Gynecology; Laterality: N/A;  . Right foot surgery  . Right total knee arthroplasty using computer-assisted navigation 03/15/2017  Dr Marry Guan  . TONSILLECTOMY & ADENOIDECTOMY  . Varicose vein sclera therapy  [phone]   Family History  Problem Relation Age of Onset  . Osteoarthritis Mother  . Deep vein thrombosis (DVT or abnormal blood clot formation) Mother  . Diabetes type II Mother  . Uterine cancer Mother  . High blood pressure (Hypertension) Father  . Osteoarthritis Father  . Skin cancer Father  . Irregular Heart Beat (Arrhythmia) Sister  . Irregular Heart Beat (Arrhythmia) Brother  . Thyroid cancer Brother  . Atrial fibrillation (Abnormal heart rhythm sometimes requiring treatment with blood thinners) Brother  . Anesthesia problems Neg Hx  . Malignant hyperthermia Neg Hx   Social History   Socioeconomic History  . Marital status: Married  Spouse name: Jeneen Rinks  . Number of children: 3  . Years of education: 53  . Highest education level: Not on file  Occupational History  . Occupation: Retired Therapist, sports  Tobacco Use  . Smoking status: Former Smoker  Packs/day: 1.00  Years:  10.00  Pack years: 10.00  Types: Cigarettes  Quit date: 09/19/1967  Years since quitting: 51.9  . Smokeless tobacco: Never Used  Vaping Use  . Vaping Use: Never used  Substance and Sexual Activity  . Alcohol use: Yes  Alcohol/week: 1.0 - 3.0 standard drinks  Types: 1 - 3 Glasses of wine per week  Comment: 2-3 x week  . Drug use: No  . Sexual activity: Defer  Partners: Male  Other Topics Concern  . Not on file  Social History Narrative  . Not on file    Social Determinants of Health   Financial Resource Strain:  . Difficulty of Paying Living Expenses:  Food Insecurity:  . Worried About Charity fundraiser in the Last Year:  . Arboriculturist in the Last Year:  Transportation Needs:  . Film/video editor (Medical):  Marland Kitchen Lack of Transportation (Non-Medical):   Review of Systems:  A comprehensive 14 point ROS was performed, reviewed, and the pertinent orthopaedic findings are documented in the HPI.  Physical Exam:   Vitals:  08/26/19 1131  BP: 124/80  Weight: 85.7 kg (189 lb)  Height: 157.5 cm (5\' 2" )  PainSc: 3  PainLoc: Shoulder   General/Constitutional: The patient appears to be well-nourished, well-developed, and in no acute distress. Neuro/Psych: Normal mood and affect, oriented to person, place and time. Eyes: Non-icteric. Pupils are equal, round, and reactive to light, and exhibit synchronous movement. ENT: Unremarkable. Lymphatic: No palpable adenopathy. Respiratory: Lungs clear to auscultation, Normal chest excursion, No wheezes and Non-labored breathing Cardiovascular: Regular rate and rhythm. No murmurs. and No edema, swelling or tenderness, except as noted in detailed exam. Integumentary: No impressive skin lesions present, except as noted in detailed exam. Musculoskeletal: Unremarkable, except as noted in detailed exam.  Left shoulder exam: SKIN: normal SWELLING: none WARMTH: none LYMPH NODES: no adenopathy palpable CREPITUS: none TENDERNESS: Mildly tender over anterolateral shoulder ROM (active):  Forward flexion: 140 degrees Abduction: 130 degrees Internal rotation: L2 ROM (passive):  Forward flexion: 150 degrees Abduction: 145 degrees ER/IR at 90 abd: 90 degrees / 50 degrees  She has mild to moderate pain with abduction, and mild pain with forward flexion and internal rotation.  STRENGTH: Forward flexion: 4-4+/5 Abduction: 4/5 External rotation: 4-4+/5 Internal rotation: 4-4+/5 Pain with RC  testing: Moderate pain with resisted abduction and mild pain with resisted forward flexion more so than with resisted external rotation  STABILITY: Normal  SPECIAL TESTS: Luan Pulling' test: positive, mild Speed's test: Mildly positive Capsulitis - pain w/ passive ER: no Crossed arm test: Minimally positive Crank: Not evaluated Anterior apprehension: Negative Posterior apprehension: Not evaluated  She is neurovascularly intact to the left upper extremity.  Imaging:  Shoulder Imaging, MRI: Left Shoulder: MRI Shoulder Cartilage: No cartilage abnormality. MRI Shoulder Rotator Cuff: Full thickness tear of the supraspinatus. No retraction. MRI Shoulder Labrum / Biceps: Biceps tendinopathy. MRI Shoulder Bone: Normal bone.  Both the films and report were reviewed by myself and discussed with the patient.  Assessment:   Encounter Diagnoses  Name Primary?  . Rotator cuff tendinitis, left Yes  . Traumatic complete tear of left rotator cuff, initial encounter   Plan:  The treatment options were discussed with the patient. In addition, patient educational materials were provided regarding the diagnosis and treatment options. The patient is frustrated by her symptoms and function limitations, and is ready to discuss more aggressive treatment options. Therefore, I have recommended a surgical procedure, specifically a left shoulder arthroscopy with debridement,  decompression, and repair of her rotator cuff tear. The procedure was discussed with the patient, as were the potential risks (including bleeding, infection, nerve and/or blood vessel injury, persistent or recurrent pain, failure of the repair, progression of arthritis, need for further surgery, blood clots, strokes, heart attacks and/or arhythmias, pneumonia, etc.) and benefits. The patient states her understanding and would like to discuss this further with her husband before committing to the scheduling of this procedure. Meanwhile, the patient  may continue her normal daily activities, but is to avoid offending activities. She may take over-the-counter medications as needed for discomfort all of the patient's questions and concerns were answered. She can call any time with further concerns. She will follow up on an as necessary basis at this time.  H&P reviewed and patient re-examined. No changes.

## 2019-09-24 NOTE — Anesthesia Procedure Notes (Signed)
Anesthesia Regional Block: Interscalene brachial plexus block   Pre-Anesthetic Checklist: ,, timeout performed, Correct Patient, Correct Site, Correct Laterality, Correct Procedure, Correct Position, site marked, Risks and benefits discussed,  Surgical consent,  Pre-op evaluation,  At surgeon's request and post-op pain management  Laterality: Left  Prep: chloraprep, alcohol swabs       Needles:  Injection technique: Single-shot  Needle Type: Echogenic Stimulator Needle     Needle Length: 5cm  Needle Gauge: 22     Additional Needles:   Procedures:, nerve stimulator,,, ultrasound used (permanent image in chart),,,,   Nerve Stimulator or Paresthesia:  Response: biceps flexion, 0.8 mA,   Additional Responses:   Narrative:  Start time: 09/24/2019 8:11 AM End time: 09/24/2019 8:31 AM Injection made incrementally with aspirations every 5 mL.  Performed by: Personally   Additional Notes: Functioning IV was confirmed and monitors were applied.  A 66mm 22ga Stimuplex needle was used. Sterile prep and drape,hand hygiene and sterile gloves were used.  Negative aspiration and negative test dose prior to incremental administration of local anesthetic. The patient tolerated the procedure well.

## 2019-09-30 DIAGNOSIS — M25512 Pain in left shoulder: Secondary | ICD-10-CM | POA: Diagnosis not present

## 2019-09-30 DIAGNOSIS — Z9889 Other specified postprocedural states: Secondary | ICD-10-CM | POA: Diagnosis not present

## 2019-09-30 DIAGNOSIS — M25612 Stiffness of left shoulder, not elsewhere classified: Secondary | ICD-10-CM | POA: Diagnosis not present

## 2019-09-30 DIAGNOSIS — R29898 Other symptoms and signs involving the musculoskeletal system: Secondary | ICD-10-CM | POA: Diagnosis not present

## 2019-10-08 DIAGNOSIS — M25512 Pain in left shoulder: Secondary | ICD-10-CM | POA: Diagnosis not present

## 2019-10-08 DIAGNOSIS — Z9889 Other specified postprocedural states: Secondary | ICD-10-CM | POA: Diagnosis not present

## 2019-10-15 DIAGNOSIS — M25512 Pain in left shoulder: Secondary | ICD-10-CM | POA: Diagnosis not present

## 2019-10-15 DIAGNOSIS — Z9889 Other specified postprocedural states: Secondary | ICD-10-CM | POA: Diagnosis not present

## 2019-10-16 DIAGNOSIS — I10 Essential (primary) hypertension: Secondary | ICD-10-CM | POA: Diagnosis not present

## 2019-10-16 DIAGNOSIS — K219 Gastro-esophageal reflux disease without esophagitis: Secondary | ICD-10-CM | POA: Diagnosis not present

## 2019-10-16 DIAGNOSIS — R109 Unspecified abdominal pain: Secondary | ICD-10-CM | POA: Diagnosis not present

## 2019-10-16 DIAGNOSIS — F419 Anxiety disorder, unspecified: Secondary | ICD-10-CM | POA: Diagnosis not present

## 2019-10-16 DIAGNOSIS — F3341 Major depressive disorder, recurrent, in partial remission: Secondary | ICD-10-CM | POA: Diagnosis not present

## 2019-10-16 DIAGNOSIS — E034 Atrophy of thyroid (acquired): Secondary | ICD-10-CM | POA: Diagnosis not present

## 2019-10-16 DIAGNOSIS — R1013 Epigastric pain: Secondary | ICD-10-CM | POA: Diagnosis not present

## 2019-10-22 DIAGNOSIS — Z9889 Other specified postprocedural states: Secondary | ICD-10-CM | POA: Diagnosis not present

## 2019-10-22 DIAGNOSIS — M25512 Pain in left shoulder: Secondary | ICD-10-CM | POA: Diagnosis not present

## 2019-10-28 DIAGNOSIS — R29818 Other symptoms and signs involving the nervous system: Secondary | ICD-10-CM | POA: Diagnosis not present

## 2019-10-28 DIAGNOSIS — M5416 Radiculopathy, lumbar region: Secondary | ICD-10-CM | POA: Diagnosis not present

## 2019-10-28 DIAGNOSIS — G629 Polyneuropathy, unspecified: Secondary | ICD-10-CM | POA: Diagnosis not present

## 2019-10-29 DIAGNOSIS — M25512 Pain in left shoulder: Secondary | ICD-10-CM | POA: Diagnosis not present

## 2019-10-29 DIAGNOSIS — Z9889 Other specified postprocedural states: Secondary | ICD-10-CM | POA: Diagnosis not present

## 2019-11-06 DIAGNOSIS — Z9889 Other specified postprocedural states: Secondary | ICD-10-CM | POA: Diagnosis not present

## 2019-11-12 DIAGNOSIS — M25512 Pain in left shoulder: Secondary | ICD-10-CM | POA: Diagnosis not present

## 2019-11-12 DIAGNOSIS — Z9889 Other specified postprocedural states: Secondary | ICD-10-CM | POA: Diagnosis not present

## 2019-11-18 DIAGNOSIS — F411 Generalized anxiety disorder: Secondary | ICD-10-CM | POA: Diagnosis not present

## 2019-11-18 DIAGNOSIS — F3342 Major depressive disorder, recurrent, in full remission: Secondary | ICD-10-CM | POA: Diagnosis not present

## 2019-11-18 DIAGNOSIS — F410 Panic disorder [episodic paroxysmal anxiety] without agoraphobia: Secondary | ICD-10-CM | POA: Diagnosis not present

## 2019-11-19 DIAGNOSIS — M25512 Pain in left shoulder: Secondary | ICD-10-CM | POA: Diagnosis not present

## 2019-11-19 DIAGNOSIS — Z9889 Other specified postprocedural states: Secondary | ICD-10-CM | POA: Diagnosis not present

## 2019-12-02 DIAGNOSIS — F3341 Major depressive disorder, recurrent, in partial remission: Secondary | ICD-10-CM | POA: Diagnosis not present

## 2019-12-02 DIAGNOSIS — M5416 Radiculopathy, lumbar region: Secondary | ICD-10-CM | POA: Diagnosis not present

## 2019-12-02 DIAGNOSIS — I10 Essential (primary) hypertension: Secondary | ICD-10-CM | POA: Diagnosis not present

## 2019-12-02 DIAGNOSIS — E7800 Pure hypercholesterolemia, unspecified: Secondary | ICD-10-CM | POA: Diagnosis not present

## 2019-12-02 DIAGNOSIS — E034 Atrophy of thyroid (acquired): Secondary | ICD-10-CM | POA: Diagnosis not present

## 2019-12-02 DIAGNOSIS — F419 Anxiety disorder, unspecified: Secondary | ICD-10-CM | POA: Diagnosis not present

## 2019-12-02 DIAGNOSIS — K219 Gastro-esophageal reflux disease without esophagitis: Secondary | ICD-10-CM | POA: Diagnosis not present

## 2019-12-03 DIAGNOSIS — Z9889 Other specified postprocedural states: Secondary | ICD-10-CM | POA: Diagnosis not present

## 2019-12-11 DIAGNOSIS — M25512 Pain in left shoulder: Secondary | ICD-10-CM | POA: Diagnosis not present

## 2019-12-11 DIAGNOSIS — Z9889 Other specified postprocedural states: Secondary | ICD-10-CM | POA: Diagnosis not present

## 2019-12-17 DIAGNOSIS — M25512 Pain in left shoulder: Secondary | ICD-10-CM | POA: Diagnosis not present

## 2019-12-17 DIAGNOSIS — Z9889 Other specified postprocedural states: Secondary | ICD-10-CM | POA: Diagnosis not present

## 2019-12-23 DIAGNOSIS — M4316 Spondylolisthesis, lumbar region: Secondary | ICD-10-CM | POA: Insufficient documentation

## 2019-12-23 DIAGNOSIS — M9963 Osseous and subluxation stenosis of intervertebral foramina of lumbar region: Secondary | ICD-10-CM | POA: Diagnosis not present

## 2019-12-23 DIAGNOSIS — M47816 Spondylosis without myelopathy or radiculopathy, lumbar region: Secondary | ICD-10-CM | POA: Diagnosis not present

## 2019-12-24 DIAGNOSIS — F330 Major depressive disorder, recurrent, mild: Secondary | ICD-10-CM | POA: Diagnosis not present

## 2019-12-24 DIAGNOSIS — M25512 Pain in left shoulder: Secondary | ICD-10-CM | POA: Diagnosis not present

## 2019-12-24 DIAGNOSIS — Z9889 Other specified postprocedural states: Secondary | ICD-10-CM | POA: Diagnosis not present

## 2019-12-24 DIAGNOSIS — F411 Generalized anxiety disorder: Secondary | ICD-10-CM | POA: Diagnosis not present

## 2019-12-31 DIAGNOSIS — M48062 Spinal stenosis, lumbar region with neurogenic claudication: Secondary | ICD-10-CM | POA: Diagnosis not present

## 2019-12-31 DIAGNOSIS — M5416 Radiculopathy, lumbar region: Secondary | ICD-10-CM | POA: Diagnosis not present

## 2019-12-31 DIAGNOSIS — M5136 Other intervertebral disc degeneration, lumbar region: Secondary | ICD-10-CM | POA: Diagnosis not present

## 2020-01-10 DIAGNOSIS — F330 Major depressive disorder, recurrent, mild: Secondary | ICD-10-CM | POA: Diagnosis not present

## 2020-01-10 DIAGNOSIS — I10 Essential (primary) hypertension: Secondary | ICD-10-CM | POA: Diagnosis not present

## 2020-01-10 DIAGNOSIS — K219 Gastro-esophageal reflux disease without esophagitis: Secondary | ICD-10-CM | POA: Diagnosis not present

## 2020-01-10 DIAGNOSIS — E7800 Pure hypercholesterolemia, unspecified: Secondary | ICD-10-CM | POA: Diagnosis not present

## 2020-01-10 DIAGNOSIS — F411 Generalized anxiety disorder: Secondary | ICD-10-CM | POA: Diagnosis not present

## 2020-01-10 DIAGNOSIS — E034 Atrophy of thyroid (acquired): Secondary | ICD-10-CM | POA: Diagnosis not present

## 2020-01-17 DIAGNOSIS — K219 Gastro-esophageal reflux disease without esophagitis: Secondary | ICD-10-CM | POA: Diagnosis not present

## 2020-01-17 DIAGNOSIS — I10 Essential (primary) hypertension: Secondary | ICD-10-CM | POA: Diagnosis not present

## 2020-01-17 DIAGNOSIS — Z0000 Encounter for general adult medical examination without abnormal findings: Secondary | ICD-10-CM | POA: Diagnosis not present

## 2020-01-17 DIAGNOSIS — N952 Postmenopausal atrophic vaginitis: Secondary | ICD-10-CM | POA: Diagnosis not present

## 2020-01-17 DIAGNOSIS — Z23 Encounter for immunization: Secondary | ICD-10-CM | POA: Diagnosis not present

## 2020-01-17 DIAGNOSIS — M5416 Radiculopathy, lumbar region: Secondary | ICD-10-CM | POA: Diagnosis not present

## 2020-01-17 DIAGNOSIS — E034 Atrophy of thyroid (acquired): Secondary | ICD-10-CM | POA: Diagnosis not present

## 2020-01-17 DIAGNOSIS — F419 Anxiety disorder, unspecified: Secondary | ICD-10-CM | POA: Diagnosis not present

## 2020-01-17 DIAGNOSIS — F3341 Major depressive disorder, recurrent, in partial remission: Secondary | ICD-10-CM | POA: Diagnosis not present

## 2020-01-17 DIAGNOSIS — E7800 Pure hypercholesterolemia, unspecified: Secondary | ICD-10-CM | POA: Diagnosis not present

## 2020-01-29 ENCOUNTER — Other Ambulatory Visit: Payer: Self-pay | Admitting: Internal Medicine

## 2020-01-29 DIAGNOSIS — Z1231 Encounter for screening mammogram for malignant neoplasm of breast: Secondary | ICD-10-CM

## 2020-02-06 DIAGNOSIS — F330 Major depressive disorder, recurrent, mild: Secondary | ICD-10-CM | POA: Diagnosis not present

## 2020-02-06 DIAGNOSIS — F411 Generalized anxiety disorder: Secondary | ICD-10-CM | POA: Diagnosis not present

## 2020-02-25 ENCOUNTER — Other Ambulatory Visit: Payer: Self-pay

## 2020-02-25 ENCOUNTER — Ambulatory Visit
Admission: RE | Admit: 2020-02-25 | Discharge: 2020-02-25 | Disposition: A | Discharge status: Home / Self Care | Payer: PPO | Source: Ambulatory Visit | Attending: Internal Medicine | Admitting: Internal Medicine

## 2020-02-25 DIAGNOSIS — Z1231 Encounter for screening mammogram for malignant neoplasm of breast: Secondary | ICD-10-CM | POA: Diagnosis not present

## 2020-02-27 DIAGNOSIS — D225 Melanocytic nevi of trunk: Secondary | ICD-10-CM | POA: Diagnosis not present

## 2020-02-27 DIAGNOSIS — X32XXXA Exposure to sunlight, initial encounter: Secondary | ICD-10-CM | POA: Diagnosis not present

## 2020-02-27 DIAGNOSIS — Z85828 Personal history of other malignant neoplasm of skin: Secondary | ICD-10-CM | POA: Diagnosis not present

## 2020-02-27 DIAGNOSIS — D2271 Melanocytic nevi of right lower limb, including hip: Secondary | ICD-10-CM | POA: Diagnosis not present

## 2020-02-27 DIAGNOSIS — D2261 Melanocytic nevi of right upper limb, including shoulder: Secondary | ICD-10-CM | POA: Diagnosis not present

## 2020-02-27 DIAGNOSIS — L700 Acne vulgaris: Secondary | ICD-10-CM | POA: Diagnosis not present

## 2020-02-27 DIAGNOSIS — L570 Actinic keratosis: Secondary | ICD-10-CM | POA: Diagnosis not present

## 2020-02-27 DIAGNOSIS — D2262 Melanocytic nevi of left upper limb, including shoulder: Secondary | ICD-10-CM | POA: Diagnosis not present

## 2020-02-27 DIAGNOSIS — L718 Other rosacea: Secondary | ICD-10-CM | POA: Diagnosis not present

## 2020-03-09 DIAGNOSIS — F330 Major depressive disorder, recurrent, mild: Secondary | ICD-10-CM | POA: Diagnosis not present

## 2020-03-09 DIAGNOSIS — F411 Generalized anxiety disorder: Secondary | ICD-10-CM | POA: Diagnosis not present

## 2020-03-10 DIAGNOSIS — N3941 Urge incontinence: Secondary | ICD-10-CM | POA: Diagnosis not present

## 2020-03-16 DIAGNOSIS — F3342 Major depressive disorder, recurrent, in full remission: Secondary | ICD-10-CM | POA: Diagnosis not present

## 2020-03-16 DIAGNOSIS — F411 Generalized anxiety disorder: Secondary | ICD-10-CM | POA: Diagnosis not present

## 2020-03-16 DIAGNOSIS — F410 Panic disorder [episodic paroxysmal anxiety] without agoraphobia: Secondary | ICD-10-CM | POA: Diagnosis not present

## 2020-04-06 DIAGNOSIS — F411 Generalized anxiety disorder: Secondary | ICD-10-CM | POA: Diagnosis not present

## 2020-04-06 DIAGNOSIS — F330 Major depressive disorder, recurrent, mild: Secondary | ICD-10-CM | POA: Diagnosis not present

## 2020-04-20 DIAGNOSIS — F330 Major depressive disorder, recurrent, mild: Secondary | ICD-10-CM | POA: Diagnosis not present

## 2020-04-20 DIAGNOSIS — F411 Generalized anxiety disorder: Secondary | ICD-10-CM | POA: Diagnosis not present

## 2020-05-11 DIAGNOSIS — F411 Generalized anxiety disorder: Secondary | ICD-10-CM | POA: Diagnosis not present

## 2020-05-11 DIAGNOSIS — F330 Major depressive disorder, recurrent, mild: Secondary | ICD-10-CM | POA: Diagnosis not present

## 2020-06-07 DIAGNOSIS — T162XXA Foreign body in left ear, initial encounter: Secondary | ICD-10-CM | POA: Diagnosis not present

## 2020-06-23 DIAGNOSIS — H43813 Vitreous degeneration, bilateral: Secondary | ICD-10-CM | POA: Diagnosis not present

## 2020-06-23 DIAGNOSIS — H2513 Age-related nuclear cataract, bilateral: Secondary | ICD-10-CM | POA: Diagnosis not present

## 2020-07-09 DIAGNOSIS — K219 Gastro-esophageal reflux disease without esophagitis: Secondary | ICD-10-CM | POA: Diagnosis not present

## 2020-07-09 DIAGNOSIS — E7800 Pure hypercholesterolemia, unspecified: Secondary | ICD-10-CM | POA: Diagnosis not present

## 2020-07-09 DIAGNOSIS — I10 Essential (primary) hypertension: Secondary | ICD-10-CM | POA: Diagnosis not present

## 2020-07-14 DIAGNOSIS — F411 Generalized anxiety disorder: Secondary | ICD-10-CM | POA: Diagnosis not present

## 2020-07-14 DIAGNOSIS — F410 Panic disorder [episodic paroxysmal anxiety] without agoraphobia: Secondary | ICD-10-CM | POA: Diagnosis not present

## 2020-07-14 DIAGNOSIS — F3342 Major depressive disorder, recurrent, in full remission: Secondary | ICD-10-CM | POA: Diagnosis not present

## 2020-07-16 DIAGNOSIS — G629 Polyneuropathy, unspecified: Secondary | ICD-10-CM | POA: Diagnosis not present

## 2020-07-16 DIAGNOSIS — M47816 Spondylosis without myelopathy or radiculopathy, lumbar region: Secondary | ICD-10-CM | POA: Diagnosis not present

## 2020-07-16 DIAGNOSIS — E7800 Pure hypercholesterolemia, unspecified: Secondary | ICD-10-CM | POA: Diagnosis not present

## 2020-07-16 DIAGNOSIS — I10 Essential (primary) hypertension: Secondary | ICD-10-CM | POA: Diagnosis not present

## 2020-07-16 DIAGNOSIS — M5416 Radiculopathy, lumbar region: Secondary | ICD-10-CM | POA: Diagnosis not present

## 2020-07-16 DIAGNOSIS — F3341 Major depressive disorder, recurrent, in partial remission: Secondary | ICD-10-CM | POA: Diagnosis not present

## 2020-07-16 DIAGNOSIS — E034 Atrophy of thyroid (acquired): Secondary | ICD-10-CM | POA: Diagnosis not present

## 2020-07-16 DIAGNOSIS — K219 Gastro-esophageal reflux disease without esophagitis: Secondary | ICD-10-CM | POA: Diagnosis not present

## 2020-07-21 DIAGNOSIS — F330 Major depressive disorder, recurrent, mild: Secondary | ICD-10-CM | POA: Diagnosis not present

## 2020-07-21 DIAGNOSIS — F411 Generalized anxiety disorder: Secondary | ICD-10-CM | POA: Diagnosis not present

## 2020-08-10 DIAGNOSIS — F330 Major depressive disorder, recurrent, mild: Secondary | ICD-10-CM | POA: Diagnosis not present

## 2020-08-10 DIAGNOSIS — F411 Generalized anxiety disorder: Secondary | ICD-10-CM | POA: Diagnosis not present

## 2020-08-12 DIAGNOSIS — H2512 Age-related nuclear cataract, left eye: Secondary | ICD-10-CM | POA: Diagnosis not present

## 2020-08-20 ENCOUNTER — Encounter: Payer: Self-pay | Admitting: Ophthalmology

## 2020-08-24 NOTE — Discharge Instructions (Signed)

## 2020-08-26 ENCOUNTER — Ambulatory Visit: Payer: PPO | Admitting: Anesthesiology

## 2020-08-26 ENCOUNTER — Other Ambulatory Visit: Payer: Self-pay

## 2020-08-26 ENCOUNTER — Encounter: Payer: Self-pay | Admitting: Ophthalmology

## 2020-08-26 ENCOUNTER — Encounter
Admission: RE | Disposition: A | Discharge status: Home / Self Care | Payer: Self-pay | Source: Home / Self Care | Attending: Ophthalmology

## 2020-08-26 ENCOUNTER — Ambulatory Visit
Admission: RE | Admit: 2020-08-26 | Discharge: 2020-08-26 | Disposition: A | Discharge status: Home / Self Care | Payer: PPO | Attending: Ophthalmology | Admitting: Ophthalmology

## 2020-08-26 DIAGNOSIS — Z886 Allergy status to analgesic agent status: Secondary | ICD-10-CM | POA: Insufficient documentation

## 2020-08-26 DIAGNOSIS — Z881 Allergy status to other antibiotic agents status: Secondary | ICD-10-CM | POA: Insufficient documentation

## 2020-08-26 DIAGNOSIS — Z79890 Hormone replacement therapy (postmenopausal): Secondary | ICD-10-CM | POA: Diagnosis not present

## 2020-08-26 DIAGNOSIS — Z79899 Other long term (current) drug therapy: Secondary | ICD-10-CM | POA: Diagnosis not present

## 2020-08-26 DIAGNOSIS — Z880 Allergy status to penicillin: Secondary | ICD-10-CM | POA: Insufficient documentation

## 2020-08-26 DIAGNOSIS — Z888 Allergy status to other drugs, medicaments and biological substances status: Secondary | ICD-10-CM | POA: Insufficient documentation

## 2020-08-26 DIAGNOSIS — Z882 Allergy status to sulfonamides status: Secondary | ICD-10-CM | POA: Diagnosis not present

## 2020-08-26 DIAGNOSIS — Z885 Allergy status to narcotic agent status: Secondary | ICD-10-CM | POA: Insufficient documentation

## 2020-08-26 DIAGNOSIS — Z87891 Personal history of nicotine dependence: Secondary | ICD-10-CM | POA: Insufficient documentation

## 2020-08-26 DIAGNOSIS — H2512 Age-related nuclear cataract, left eye: Secondary | ICD-10-CM | POA: Diagnosis not present

## 2020-08-26 DIAGNOSIS — H25812 Combined forms of age-related cataract, left eye: Secondary | ICD-10-CM | POA: Diagnosis not present

## 2020-08-26 HISTORY — DX: Unspecified osteoarthritis, unspecified site: M19.90

## 2020-08-26 HISTORY — PX: CATARACT EXTRACTION W/PHACO: SHX586

## 2020-08-26 SURGERY — PHACOEMULSIFICATION, CATARACT, WITH IOL INSERTION
Anesthesia: Monitor Anesthesia Care | Site: Eye | Laterality: Left

## 2020-08-26 MED ORDER — LIDOCAINE HCL (PF) 2 % IJ SOLN
INTRAOCULAR | Status: DC | PRN
  Administered 2020-08-26: 1 mL

## 2020-08-26 MED ORDER — FENTANYL CITRATE (PF) 100 MCG/2ML IJ SOLN
INTRAMUSCULAR | Status: DC | PRN
  Administered 2020-08-26: 50 ug via INTRAVENOUS

## 2020-08-26 MED ORDER — LACTATED RINGERS IV SOLN: INTRAVENOUS | Status: DC

## 2020-08-26 MED ORDER — MOXIFLOXACIN HCL 0.5 % OP SOLN
OPHTHALMIC | Status: DC | PRN
  Administered 2020-08-26: 0.2 mL via OPHTHALMIC

## 2020-08-26 MED ORDER — ACETAMINOPHEN 325 MG PO TABS: 325.0000 mg | ORAL_TABLET | Freq: Once | ORAL | Status: DC

## 2020-08-26 MED ORDER — ACETAMINOPHEN 160 MG/5ML PO SOLN: 325.0000 mg | Freq: Once | ORAL | Status: DC

## 2020-08-26 MED ORDER — ARMC OPHTHALMIC DILATING DROPS
1.0000 "application " | OPHTHALMIC | Status: DC | PRN
  Administered 2020-08-26 (×3): 1 via OPHTHALMIC

## 2020-08-26 MED ORDER — TETRACAINE HCL 0.5 % OP SOLN
1.0000 [drp] | OPHTHALMIC | Status: DC | PRN
  Administered 2020-08-26 (×3): 1 [drp] via OPHTHALMIC

## 2020-08-26 MED ORDER — EPINEPHRINE PF 1 MG/ML IJ SOLN
INTRAOCULAR | Status: DC | PRN
  Administered 2020-08-26: 63 mL via OPHTHALMIC

## 2020-08-26 MED ORDER — NA HYALUR & NA CHOND-NA HYALUR 0.4-0.35 ML IO KIT
PACK | INTRAOCULAR | Status: DC | PRN
  Administered 2020-08-26: 1 mL via INTRAOCULAR

## 2020-08-26 MED ORDER — MIDAZOLAM HCL 2 MG/2ML IJ SOLN
INTRAMUSCULAR | Status: DC | PRN
  Administered 2020-08-26: 1 mg via INTRAVENOUS

## 2020-08-26 MED ORDER — BRIMONIDINE TARTRATE-TIMOLOL 0.2-0.5 % OP SOLN
OPHTHALMIC | Status: DC | PRN
  Administered 2020-08-26: 1 [drp] via OPHTHALMIC

## 2020-08-26 SURGICAL SUPPLY — 26 items
CANNULA ANT/CHMB 27GA (MISCELLANEOUS) ×2 IMPLANT
GLOVE SURG ENC TEXT LTX SZ7.5 (GLOVE) ×4 IMPLANT
GLOVE SURG TRIUMPH 8.0 PF LTX (GLOVE) ×2 IMPLANT
GOWN STRL REUS W/ TWL LRG LVL3 (GOWN DISPOSABLE) ×2 IMPLANT
GOWN STRL REUS W/TWL LRG LVL3 (GOWN DISPOSABLE) ×4
LENS IOL IQ PAN TRC 30 15.0 ×1 IMPLANT
LENS IOL PANOP TORIC 30 15.0 ×1 IMPLANT
LENS IOL PANOPTIX TORIC 15.0 ×2 IMPLANT
MARKER SKIN DUAL TIP RULER LAB (MISCELLANEOUS) ×2 IMPLANT
NDL RETROBULBAR .5 NSTRL (NEEDLE) IMPLANT
NEEDLE CAPSULORHEX 25GA (NEEDLE) ×2 IMPLANT
NEEDLE FILTER BLUNT 18X 1/2SAF (NEEDLE) ×2
NEEDLE FILTER BLUNT 18X1 1/2 (NEEDLE) ×2 IMPLANT
PACK CATARACT BRASINGTON (MISCELLANEOUS) ×2 IMPLANT
PACK EYE AFTER SURG (MISCELLANEOUS) ×2 IMPLANT
PACK OPTHALMIC (MISCELLANEOUS) ×2 IMPLANT
RING MALYGIN 7.0 (MISCELLANEOUS) IMPLANT
SOLUTION OPHTHALMIC SALT (MISCELLANEOUS) ×2 IMPLANT
SUT ETHILON 10-0 CS-B-6CS-B-6 (SUTURE)
SUT VICRYL  9 0 (SUTURE)
SUT VICRYL 9 0 (SUTURE) IMPLANT
SUTURE EHLN 10-0 CS-B-6CS-B-6 (SUTURE) IMPLANT
SYR 3ML LL SCALE MARK (SYRINGE) ×4 IMPLANT
SYR TB 1ML LUER SLIP (SYRINGE) ×2 IMPLANT
WATER STERILE IRR 250ML POUR (IV SOLUTION) ×2 IMPLANT
WIPE NON LINTING 3.25X3.25 (MISCELLANEOUS) ×2 IMPLANT

## 2020-08-26 NOTE — H&P (Signed)
[facility]   Primary Care Physician:  Adin Hector, MD Ophthalmologist: Dr. Leandrew Koyanagi  Pre-Procedure History & Physical: HPI:  Mary Elliott is a 80 y.o. female here for ophthalmic surgery.   Past Medical History:  Diagnosis Date  . Anemia   . Anxiety   . Arthritis    fingers  . Cancer ([place]) 1999   basil cell on face  . GERD (gastroesophageal reflux disease)    history of  . History of kidney stones    twice  . HOH (hard of hearing)   . Hypertension   . Hypothyroidism   . PONV (postoperative nausea and vomiting)    nausea only with 2 prior surgeries  . Tinnitus     Past Surgical History:  Procedure Laterality Date  . ABDOMINAL HYSTERECTOMY    . basil cell     face  . BLEPHAROPLASTY    . CYSTOCELE REPAIR  2005  . HEMORROIDECTOMY    . JOINT REPLACEMENT Bilateral 11/2016  . KNEE ARTHROPLASTY Left 11/14/2016   Procedure: COMPUTER ASSISTED TOTAL KNEE ARTHROPLASTY;  Surgeon: Dereck Leep, MD;  Location: [facility];  Service: Orthopedics;  Laterality: Left;  . KNEE ARTHROPLASTY Right 03/15/2017   Procedure: COMPUTER ASSISTED TOTAL KNEE ARTHROPLASTY;  Surgeon: Dereck Leep, MD;  Location: [facility];  Service: Orthopedics;  Laterality: Right;  . KNEE ARTHROSCOPY Left   . LIPOSUCTION    . PELVIC FLOOR REPAIR    . SHOULDER ARTHROSCOPY WITH DEBRIDEMENT AND BICEP TENDON REPAIR Left 09/24/2019   Procedure: Left shoulder arthroscopy with debridement, decompression, rotator cuff repair, and possible biceps tenodesis.;  Surgeon: Corky Mull, MD;  Location: [facility];  Service: Orthopedics;  Laterality: Left;  . TOE SURGERY Right   . TONSILLECTOMY    . VARICOSE VEIN SURGERY      Prior to Admission medications   Medication Sig Start Date End Date Taking? Authorizing Provider  Cholecalciferol (VITAMIN D3) 2000 units capsule Take 2,000 Units by mouth daily.   Yes [provider]  estradiol (ESTRACE) 0.1 MG/GM vaginal cream Place 1 Applicatorful  vaginally 2 (two) times a week.    Yes [provider]  fluticasone (FLONASE) 50 MCG/ACT nasal spray Place 2 sprays into both nostrils daily as needed for allergies or rhinitis.   Yes [provider]  Glucosamine-Chondroitin (COSAMIN DS PO) Take 1 tablet by mouth daily.   Yes [provider]  levothyroxine (SYNTHROID, LEVOTHROID) 100 MCG tablet Take 100 mcg by mouth daily before breakfast.   Yes [provider]  loratadine (CLARITIN) 10 MG tablet Take 10 mg by mouth daily as needed for allergies.   Yes [provider]  metoprolol succinate (TOPROL-XL) 100 MG 24 hr tablet Take 100 mg by mouth daily.  12/30/15  Yes [provider]  Probiotic Product (PROBIOTIC PO) Take 1 capsule by mouth daily after lunch.   Yes [provider]  sertraline (ZOLOFT) 50 MG tablet Take 50 mg by mouth daily.    Yes [provider]  vitamin B-12 (CYANOCOBALAMIN) 1000 MCG tablet Take 1,000 mcg by mouth daily.   Yes [provider]  metroNIDAZOLE (METROCREAM) 0.75 % cream Apply 1 application topically 2 (two) times daily as needed (rosacea.).     [provider]  oxyCODONE (ROXICODONE) 5 MG immediate release tablet Take 1-2 tablets (5-10 mg total) by mouth every 4 (four) hours as needed. Patient not taking: Reported on 08/20/2020 09/24/19   Poggi, Marshall Cork, MD  Allergies as of 06/25/2020 - Review Complete 09/24/2019  Allergen Reaction Noted  . Clindamycin/lincomycin Hives 01/11/2017  . Nitrofurantoin Hives 01/03/2013  . Amlodipine Other (See Comments) 07/03/2017  . Amoxicillin Itching 07/09/2018  . Duloxetine Other (See Comments) 01/03/2013  . Nsaids Other (See Comments) 01/03/2013  . Oxybutynin Hives, Rash, and Other (See Comments) 01/03/2013  . Sulfa antibiotics Other (See Comments) and Diarrhea 01/03/2013  . Tegaserod Other (See Comments) 07/23/2013  . Ultram [tramadol] Itching 03/02/2017  . Vesicare [solifenacin succinate]  Other (See Comments) 11/02/2016    Family History  Problem Relation Age of Onset  . Breast cancer Neg Hx     Social History   Socioeconomic History  . Marital status: Married    Spouse name: Not on file  . Number of children: Not on file  . Years of education: Not on file  . Highest education level: Not on file  Occupational History  . Not on file  Tobacco Use  . Smoking status: Former Smoker    Quit date: 03/02/1968    Years since quitting: 52.5  . Smokeless tobacco: Never Used  Vaping Use  . Vaping Use: Never used  Substance and Sexual Activity  . Alcohol use: Yes    Alcohol/week: 4.0 standard drinks    Types: 2 Glasses of wine, 2 Cans of beer per week    Comment: occasionally  . Drug use: No  . Sexual activity: Not on file  Other Topics Concern  . Not on file  Social History Narrative  . Not on file   Social Determinants of Health   Financial Resource Strain: Not on file  Food Insecurity: Not on file  Transportation Needs: Not on file  Physical Activity: Not on file  Stress: Not on file  Social Connections: Not on file  Intimate Partner Violence: Not on file    Review of Systems: See HPI, otherwise negative ROS  Physical Exam: Ht 5\' 2"  (1.575 m)   Wt 84.8 kg   BMI 34.20 kg/m  General:   Alert,  pleasant and cooperative in NAD Head:  Normocephalic and atraumatic. Lungs:  Clear to auscultation.    Heart:  Regular rate and rhythm.   Impression/Plan: Mary Elliott is here for ophthalmic surgery.  Risks, benefits, limitations, and alternatives regarding ophthalmic surgery have been reviewed with the patient.  Questions have been answered.  All parties agreeable.   Leandrew Koyanagi, MD  08/26/2020, 8:46 AM

## 2020-08-26 NOTE — Anesthesia Postprocedure Evaluation (Signed)
Anesthesia Post Note  Patient: [name]  Procedure(s) Performed: CATARACT EXTRACTION PHACO AND INTRAOCULAR LENS PLACEMENT (IOC) LEFT PANOPTIX TORIC (Left Eye)     Patient location during evaluation: PACU Anesthesia Type: MAC Level of consciousness: awake and alert and oriented Pain management: satisfactory to patient Vital Signs Assessment: post-procedure vital signs reviewed and stable Respiratory status: spontaneous breathing, nonlabored ventilation and respiratory function stable Cardiovascular status: blood pressure returned to baseline and stable Postop Assessment: Adequate PO intake and No signs of nausea or vomiting Anesthetic complications: no   No complications documented.  Raliegh Ip

## 2020-08-26 NOTE — Anesthesia Procedure Notes (Signed)
Procedure Name: MAC Date/Time: 08/26/2020 9:48 AM Performed by: Silvana Newness, CRNA Pre-anesthesia Checklist: Patient identified, Emergency Drugs available, Suction available, Patient being monitored and Timeout performed Patient Re-evaluated:Patient Re-evaluated prior to induction Oxygen Delivery Method: Nasal cannula Placement Confirmation: positive ETCO2

## 2020-08-26 NOTE — Anesthesia Preprocedure Evaluation (Signed)
Anesthesia Evaluation  Patient identified by MRN, date of birth, ID band Patient awake    Reviewed: Allergy & Precautions, H&P , NPO status , Patient's Chart, lab work & pertinent test results  History of Anesthesia Complications (+) PONV and history of anesthetic complications  Airway Mallampati: II  TM Distance: >3 FB Neck ROM: full    Dental no notable dental hx.    Pulmonary former smoker,    Pulmonary exam normal breath sounds clear to auscultation       Cardiovascular hypertension, Normal cardiovascular exam Rhythm:regular Rate:Normal     Neuro/Psych    GI/Hepatic GERD  ,  Endo/Other  Hypothyroidism   Renal/GU      Musculoskeletal   Abdominal   Peds  Hematology   Anesthesia Other Findings   Reproductive/Obstetrics                             Anesthesia Physical Anesthesia Plan  ASA: II  Anesthesia Plan: MAC   Post-op Pain Management:    Induction:   PONV Risk Score and Plan: 3 and Treatment may vary due to age or medical condition, Midazolam and TIVA  Airway Management Planned:   Additional Equipment:   Intra-op Plan:   Post-operative Plan:   Informed Consent: I have reviewed the patients History and Physical, chart, labs and discussed the procedure including the risks, benefits and alternatives for the proposed anesthesia with the patient or authorized representative who has indicated his/her understanding and acceptance.     Dental Advisory Given  Plan Discussed with: CRNA  Anesthesia Plan Comments:         Anesthesia Quick Evaluation

## 2020-08-26 NOTE — Transfer of Care (Signed)
Immediate Anesthesia Transfer of Care Note  Patient: Mary Elliott  Procedure(s) Performed: CATARACT EXTRACTION PHACO AND INTRAOCULAR LENS PLACEMENT (IOC) LEFT PANOPTIX TORIC (Left Eye)  Patient Location: PACU  Anesthesia Type: MAC  Level of Consciousness: awake, alert  and patient cooperative  Airway and Oxygen Therapy: Patient Spontanous Breathing and Patient connected to supplemental oxygen  Post-op Assessment: Post-op Vital signs reviewed, Patient's Cardiovascular Status Stable, Respiratory Function Stable, Patent Airway and No signs of Nausea or vomiting  Post-op Vital Signs: Reviewed and stable  Complications: No complications documented.

## 2020-08-26 NOTE — Op Note (Signed)
LOCATION:  [place]   PREOPERATIVE DIAGNOSIS:  Nuclear sclerotic cataract of the left eye.  H25.12  POSTOPERATIVE DIAGNOSIS:  Nuclear sclerotic cataract of the left eye.   PROCEDURE:  Phacoemulsification with Toric posterior chamber intraocular lens placement of the left eye.  Ultrasound time: Procedure(s) with comments: CATARACT EXTRACTION PHACO AND INTRAOCULAR LENS PLACEMENT (I[facility]) LEFT PANOPTIX TORIC (Left) - 8.88 1.:27.2 10.1% LENS: [id number] 15.0 D Panoptix Toric intraocular lens with 1.5 diopters of cylindrical power with axis orientation at 5 degrees.    SURGEON:  Wyonia Hough, MD   ANESTHESIA:  Topical with tetracaine drops and 2% Xylocaine jelly, augmented with 1% preservative-free intracameral lidocaine.  COMPLICATIONS:  None.   DESCRIPTION OF PROCEDURE:  The patient was identified in the holding room and transported to the operating suite and placed in the supine position under the operating microscope.  The left eye was identified as the operative eye, and it was prepped and draped in the usual sterile ophthalmic fashion.    A clear-corneal paracentesis incision was made at the 1:30 position.  0.5 ml of preservative-free 1% lidocaine was injected into the anterior chamber. The anterior chamber was filled with Viscoat.  A 2.4 millimeter near clear corneal incision was then made at the 10:30 position.  A cystotome and capsulorrhexis forceps were then used to make a curvilinear capsulorrhexis.  Hydrodissection and hydrodelineation were then performed using balanced salt solution.   Phacoemulsification was then used in stop and chop fashion to remove the lens, nucleus and epinucleus.  The remaining cortex was aspirated using the irrigation and aspiration handpiece.  Provisc viscoelastic was then placed into the capsular bag to distend it for lens placement.  The Verion digital marker was used to align the implant at the intended axis.   A 15.0 diopter lens was  then injected into the capsular bag.  It was rotated clockwise until the axis marks on the lens were approximately 15 degrees in the counterclockwise direction to the intended alignment.  The viscoelastic was aspirated from the eye using the irrigation aspiration handpiece.  Then, a Koch spatula through the sideport incision was used to rotate the lens in a clockwise direction until the axis markings of the intraocular lens were lined up with the Verion alignment.  Balanced salt solution was then used to hydrate the wounds. Vigamox 0.2 ml of a 1mg  per ml solution was injected into the anterior chamber for a dose of 0.2 mg of intracameral antibiotic at the completion of the case.    The eye was noted to have a physiologic pressure and there was no wound leak noted.   Timolol and Brimonidine drops were applied to the eye.  The patient was taken to the recovery room in stable condition having had no complications of anesthesia or surgery.  Mary Elliott 08/26/2020, 10:10 AM

## 2020-08-27 ENCOUNTER — Encounter: Payer: Self-pay | Admitting: Ophthalmology

## 2020-09-01 DIAGNOSIS — H2511 Age-related nuclear cataract, right eye: Secondary | ICD-10-CM | POA: Diagnosis not present

## 2020-09-03 DIAGNOSIS — H903 Sensorineural hearing loss, bilateral: Secondary | ICD-10-CM | POA: Diagnosis not present

## 2020-09-03 DIAGNOSIS — T161XXA Foreign body in right ear, initial encounter: Secondary | ICD-10-CM | POA: Diagnosis not present

## 2020-09-04 DIAGNOSIS — F330 Major depressive disorder, recurrent, mild: Secondary | ICD-10-CM | POA: Diagnosis not present

## 2020-09-04 DIAGNOSIS — F411 Generalized anxiety disorder: Secondary | ICD-10-CM | POA: Diagnosis not present

## 2020-09-07 NOTE — Discharge Instructions (Signed)

## 2020-09-09 ENCOUNTER — Ambulatory Visit: Payer: PPO | Admitting: Anesthesiology

## 2020-09-09 ENCOUNTER — Encounter: Payer: Self-pay | Admitting: Ophthalmology

## 2020-09-09 ENCOUNTER — Encounter
Admission: RE | Disposition: A | Discharge status: Home / Self Care | Payer: Self-pay | Source: Home / Self Care | Attending: Ophthalmology

## 2020-09-09 ENCOUNTER — Ambulatory Visit
Admission: RE | Admit: 2020-09-09 | Discharge: 2020-09-09 | Disposition: A | Discharge status: Home / Self Care | Payer: PPO | Attending: Ophthalmology | Admitting: Ophthalmology

## 2020-09-09 ENCOUNTER — Other Ambulatory Visit: Payer: Self-pay

## 2020-09-09 DIAGNOSIS — Z79899 Other long term (current) drug therapy: Secondary | ICD-10-CM | POA: Diagnosis not present

## 2020-09-09 DIAGNOSIS — Z79890 Hormone replacement therapy (postmenopausal): Secondary | ICD-10-CM | POA: Diagnosis not present

## 2020-09-09 DIAGNOSIS — Z96653 Presence of artificial knee joint, bilateral: Secondary | ICD-10-CM | POA: Diagnosis not present

## 2020-09-09 DIAGNOSIS — H2511 Age-related nuclear cataract, right eye: Secondary | ICD-10-CM | POA: Diagnosis not present

## 2020-09-09 DIAGNOSIS — Z888 Allergy status to other drugs, medicaments and biological substances status: Secondary | ICD-10-CM | POA: Diagnosis not present

## 2020-09-09 DIAGNOSIS — Z886 Allergy status to analgesic agent status: Secondary | ICD-10-CM | POA: Diagnosis not present

## 2020-09-09 DIAGNOSIS — Z961 Presence of intraocular lens: Secondary | ICD-10-CM | POA: Insufficient documentation

## 2020-09-09 DIAGNOSIS — Z9842 Cataract extraction status, left eye: Secondary | ICD-10-CM | POA: Insufficient documentation

## 2020-09-09 DIAGNOSIS — Z885 Allergy status to narcotic agent status: Secondary | ICD-10-CM | POA: Insufficient documentation

## 2020-09-09 DIAGNOSIS — Z85828 Personal history of other malignant neoplasm of skin: Secondary | ICD-10-CM | POA: Insufficient documentation

## 2020-09-09 DIAGNOSIS — H25811 Combined forms of age-related cataract, right eye: Secondary | ICD-10-CM | POA: Diagnosis not present

## 2020-09-09 DIAGNOSIS — Z87891 Personal history of nicotine dependence: Secondary | ICD-10-CM | POA: Insufficient documentation

## 2020-09-09 DIAGNOSIS — Z882 Allergy status to sulfonamides status: Secondary | ICD-10-CM | POA: Insufficient documentation

## 2020-09-09 DIAGNOSIS — Z880 Allergy status to penicillin: Secondary | ICD-10-CM | POA: Diagnosis not present

## 2020-09-09 DIAGNOSIS — Z881 Allergy status to other antibiotic agents status: Secondary | ICD-10-CM | POA: Insufficient documentation

## 2020-09-09 HISTORY — PX: CATARACT EXTRACTION W/PHACO: SHX586

## 2020-09-09 SURGERY — PHACOEMULSIFICATION, CATARACT, WITH IOL INSERTION
Anesthesia: Monitor Anesthesia Care | Site: Eye | Laterality: Right

## 2020-09-09 MED ORDER — BRIMONIDINE TARTRATE-TIMOLOL 0.2-0.5 % OP SOLN
OPHTHALMIC | Status: DC | PRN
  Administered 2020-09-09: 1 [drp] via OPHTHALMIC

## 2020-09-09 MED ORDER — TETRACAINE HCL 0.5 % OP SOLN
1.0000 [drp] | OPHTHALMIC | Status: DC | PRN
  Administered 2020-09-09 (×3): 1 [drp] via OPHTHALMIC

## 2020-09-09 MED ORDER — FENTANYL CITRATE (PF) 100 MCG/2ML IJ SOLN
INTRAMUSCULAR | Status: DC | PRN
  Administered 2020-09-09: 50 ug via INTRAVENOUS

## 2020-09-09 MED ORDER — NA HYALUR & NA CHOND-NA HYALUR 0.4-0.35 ML IO KIT
PACK | INTRAOCULAR | Status: DC | PRN
  Administered 2020-09-09: 1 mL via INTRAOCULAR

## 2020-09-09 MED ORDER — LIDOCAINE HCL (PF) 2 % IJ SOLN
INTRAOCULAR | Status: DC | PRN
  Administered 2020-09-09: 1 mL

## 2020-09-09 MED ORDER — MOXIFLOXACIN HCL 0.5 % OP SOLN
OPHTHALMIC | Status: DC | PRN
  Administered 2020-09-09: 0.2 mL via OPHTHALMIC

## 2020-09-09 MED ORDER — MIDAZOLAM HCL 2 MG/2ML IJ SOLN
INTRAMUSCULAR | Status: DC | PRN
  Administered 2020-09-09: 1 mg via INTRAVENOUS

## 2020-09-09 MED ORDER — ARMC OPHTHALMIC DILATING DROPS
1.0000 "application " | OPHTHALMIC | Status: DC | PRN
  Administered 2020-09-09 (×3): 1 via OPHTHALMIC

## 2020-09-09 MED ORDER — ONDANSETRON HCL 4 MG/2ML IJ SOLN: 4.0000 mg | Freq: Once | INTRAMUSCULAR | Status: DC | PRN

## 2020-09-09 MED ORDER — EPINEPHRINE PF 1 MG/ML IJ SOLN
INTRAOCULAR | Status: DC | PRN
  Administered 2020-09-09: 53 mL via OPHTHALMIC

## 2020-09-09 SURGICAL SUPPLY — 20 items
CANNULA ANT/CHMB 27GA (MISCELLANEOUS) ×2 IMPLANT
GLOVE SURG ENC TEXT LTX SZ7.5 (GLOVE) ×2 IMPLANT
GLOVE SURG TRIUMPH 8.0 PF LTX (GLOVE) ×2 IMPLANT
GOWN STRL REUS W/ TWL LRG LVL3 (GOWN DISPOSABLE) ×2 IMPLANT
GOWN STRL REUS W/TWL LRG LVL3 (GOWN DISPOSABLE) ×4
LENS IOL IQ PAN TRC 30 15.0 ×1 IMPLANT
LENS IOL PANOP TORIC 30 15.0 ×1 IMPLANT
LENS IOL PANOPTIX TORIC 15.0 ×2 IMPLANT
MARKER SKIN DUAL TIP RULER LAB (MISCELLANEOUS) ×2 IMPLANT
NEEDLE CAPSULORHEX 25GA (NEEDLE) ×2 IMPLANT
NEEDLE FILTER BLUNT 18X 1/2SAF (NEEDLE) ×2
NEEDLE FILTER BLUNT 18X1 1/2 (NEEDLE) ×2 IMPLANT
PACK CATARACT BRASINGTON (MISCELLANEOUS) ×2 IMPLANT
PACK EYE AFTER SURG (MISCELLANEOUS) ×2 IMPLANT
PACK OPTHALMIC (MISCELLANEOUS) ×2 IMPLANT
SOLUTION OPHTHALMIC SALT (MISCELLANEOUS) ×2 IMPLANT
SYR 3ML LL SCALE MARK (SYRINGE) ×4 IMPLANT
SYR TB 1ML LUER SLIP (SYRINGE) ×2 IMPLANT
WATER STERILE IRR 250ML POUR (IV SOLUTION) ×2 IMPLANT
WIPE NON LINTING 3.25X3.25 (MISCELLANEOUS) ×2 IMPLANT

## 2020-09-09 NOTE — Transfer of Care (Signed)
Immediate Anesthesia Transfer of Care Note  Patient: Mary Elliott  Procedure(s) Performed: CATARACT EXTRACTION PHACO AND INTRAOCULAR LENS PLACEMENT ([facility]) RIGHT PANOPTIX TORIC 8.54 01:03.3 13.5% (Right Eye)  Patient Location: PACU  Anesthesia Type: MAC  Level of Consciousness: awake, alert  and patient cooperative  Airway and Oxygen Therapy: Patient Spontanous Breathing   Post-op Assessment: Post-op Vital signs reviewed, Patient's Cardiovascular Status Stable, Respiratory Function Stable, Patent Airway and No signs of Nausea or vomiting  Post-op Vital Signs: Reviewed and stable  Complications: No complications documented.

## 2020-09-09 NOTE — Op Note (Signed)
LOCATION:  [place]   PREOPERATIVE DIAGNOSIS:  Nuclear sclerotic cataract of the right eye.  H25.11   POSTOPERATIVE DIAGNOSIS:  Nuclear sclerotic cataract of the right eye.   PROCEDURE:  Phacoemulsification with Toric posterior chamber intraocular lens placement of the right eye.  Ultrasound time: Procedure(s): CATARACT EXTRACTION PHACO AND INTRAOCULAR LENS PLACEMENT ([facility]) RIGHT PANOPTIX TORIC 8.54 01:03.3 13.5% (Right)  LENS:   Implant Name Type Inv. Item Serial No. Manufacturer Lot No. LRB No. Used Action  LENS IOL PANOPTIX TORIC 15.0 - [id number]  LENS IOL PANOPTIX TORIC 15.0 [id number] ALCON  Right 1 Implanted     [id number] Panoptix Toric intraocular lens with 1.5 diopters of cylindrical power with axis orientation at 163 degrees.   SURGEON:  Wyonia Hough, MD   ANESTHESIA: Topical with tetracaine drops and 2% Xylocaine jelly, augmented with 1% preservative-free intracameral lidocaine.    COMPLICATIONS:  None.   DESCRIPTION OF PROCEDURE:  The patient was identified in the holding room and transported to the operating suite and placed in the supine position under the operating microscope.  The right eye was identified as the operative eye, and it was prepped and draped in the usual sterile ophthalmic fashion.    A clear-corneal paracentesis incision was made at the 12:00 position.  0.5 ml of preservative-free 1% lidocaine was injected into the anterior chamber. The anterior chamber was filled with Viscoat.  A 2.4 millimeter near clear corneal incision was then made at the 9:00 position.  A cystotome and capsulorrhexis forceps were then used to make a curvilinear capsulorrhexis.  Hydrodissection and hydrodelineation were then performed using balanced salt solution.   Phacoemulsification was then used in stop and chop fashion to remove the lens, nucleus and epinucleus.  The remaining cortex was aspirated using the irrigation and aspiration handpiece.  Provisc  viscoelastic was then placed into the capsular bag to distend it for lens placement.  The Verion digital marker was used to align the implant at the intended axis.   A Toric lens was then injected into the capsular bag.  It was rotated clockwise until the axis marks on the lens were approximately 15 degrees in the counterclockwise direction to the intended alignment.  The viscoelastic was aspirated from the eye using the irrigation aspiration handpiece.  Then, a Koch spatula through the sideport incision was used to rotate the lens in a clockwise direction until the axis markings of the intraocular lens were lined up with the Verion alignment.  Balanced salt solution was then used to hydrate the wounds. Vigamox 0.2 ml of a 1mg  per ml solution was injected into the anterior chamber for a dose of 0.2 mg of intracameral antibiotic at the completion of the case.    The eye was noted to have a physiologic pressure and there was no wound leak noted.   Timolol and Brimonidine drops were applied to the eye.  The patient was taken to the recovery room in stable condition having had no complications of anesthesia or surgery.  Hersh Minney 09/09/2020, 1:20 PM

## 2020-09-09 NOTE — H&P (Signed)
[facility]   Primary Care Physician:  Adin Hector, MD Ophthalmologist: Dr. Leandrew Koyanagi  Pre-Procedure History & Physical: HPI:  Mary Elliott is a 80 y.o. female here for ophthalmic surgery.   Past Medical History:  Diagnosis Date  . Anemia   . Anxiety   . Arthritis    fingers  . Cancer ([place]) 1999   basil cell on face  . GERD (gastroesophageal reflux disease)    history of  . History of kidney stones    twice  . HOH (hard of hearing)   . Hypertension   . Hypothyroidism   . PONV (postoperative nausea and vomiting)    nausea only with 2 prior surgeries  . Tinnitus     Past Surgical History:  Procedure Laterality Date  . ABDOMINAL HYSTERECTOMY    . basil cell     face  . BLEPHAROPLASTY    . CATARACT EXTRACTION W/PHACO Left 08/26/2020   Procedure: CATARACT EXTRACTION PHACO AND INTRAOCULAR LENS PLACEMENT ([place]) LEFT PANOPTIX TORIC;  Surgeon: Leandrew Koyanagi, MD;  Location: [place];  Service: Ophthalmology;  Laterality: Left;  8.88 1.:27.2 10.1%  . CYSTOCELE REPAIR  2005  . HEMORROIDECTOMY    . JOINT REPLACEMENT Bilateral 11/2016  . KNEE ARTHROPLASTY Left 11/14/2016   Procedure: COMPUTER ASSISTED TOTAL KNEE ARTHROPLASTY;  Surgeon: Dereck Leep, MD;  Location: [facility];  Service: Orthopedics;  Laterality: Left;  . KNEE ARTHROPLASTY Right 03/15/2017   Procedure: COMPUTER ASSISTED TOTAL KNEE ARTHROPLASTY;  Surgeon: Dereck Leep, MD;  Location: [facility];  Service: Orthopedics;  Laterality: Right;  . KNEE ARTHROSCOPY Left   . LIPOSUCTION    . PELVIC FLOOR REPAIR    . SHOULDER ARTHROSCOPY WITH DEBRIDEMENT AND BICEP TENDON REPAIR Left 09/24/2019   Procedure: Left shoulder arthroscopy with debridement, decompression, rotator cuff repair, and possible biceps tenodesis.;  Surgeon: Corky Mull, MD;  Location: [facility];  Service: Orthopedics;  Laterality: Left;  . TOE SURGERY Right   . TONSILLECTOMY    . VARICOSE VEIN SURGERY       Prior to Admission medications   Medication Sig Start Date End Date Taking? Authorizing Provider  Cholecalciferol (VITAMIN D3) 2000 units capsule Take 2,000 Units by mouth daily.   Yes [provider]  estradiol (ESTRACE) 0.1 MG/GM vaginal cream Place 1 Applicatorful vaginally 2 (two) times a week.    Yes [provider]  fluticasone (FLONASE) 50 MCG/ACT nasal spray Place 2 sprays into both nostrils daily as needed for allergies or rhinitis.   Yes [provider]  Glucosamine-Chondroitin (COSAMIN DS PO) Take 1 tablet by mouth daily.   Yes [provider]  levothyroxine (SYNTHROID, LEVOTHROID) 100 MCG tablet Take 100 mcg by mouth daily before breakfast.   Yes [provider]  loratadine (CLARITIN) 10 MG tablet Take 10 mg by mouth daily as needed for allergies.   Yes [provider]  metoprolol succinate (TOPROL-XL) 100 MG 24 hr tablet Take 100 mg by mouth daily.  12/30/15  Yes [provider]  metroNIDAZOLE (METROCREAM) 0.75 % cream Apply 1 application topically 2 (two) times daily as needed (rosacea.).    Yes [provider]  Probiotic Product (PROBIOTIC PO) Take 1 capsule by mouth daily after lunch.   Yes [provider]  sertraline (ZOLOFT) 50 MG tablet Take 50 mg by mouth daily.    Yes [provider]  vitamin B-12 (CYANOCOBALAMIN) 1000 MCG tablet Take 1,000 mcg by mouth daily.  Yes [provider]  oxyCODONE (ROXICODONE) 5 MG immediate release tablet Take 1-2 tablets (5-10 mg total) by mouth every 4 (four) hours as needed. Patient not taking: Reported on 08/20/2020 09/24/19   Poggi, Marshall Cork, MD    Allergies as of 06/25/2020 - Review Complete 09/24/2019  Allergen Reaction Noted  . Clindamycin/lincomycin Hives 01/11/2017  . Nitrofurantoin Hives 01/03/2013  . Amlodipine Other (See Comments) 07/03/2017  . Amoxicillin Itching 07/09/2018  . Duloxetine Other (See Comments) 01/03/2013  . Nsaids  Other (See Comments) 01/03/2013  . Oxybutynin Hives, Rash, and Other (See Comments) 01/03/2013  . Sulfa antibiotics Other (See Comments) and Diarrhea 01/03/2013  . Tegaserod Other (See Comments) 07/23/2013  . Ultram [tramadol] Itching 03/02/2017  . Vesicare [solifenacin succinate] Other (See Comments) 11/02/2016    Family History  Problem Relation Age of Onset  . Breast cancer Neg Hx     Social History   Socioeconomic History  . Marital status: Married    Spouse name: Not on file  . Number of children: Not on file  . Years of education: Not on file  . Highest education level: Not on file  Occupational History  . Not on file  Tobacco Use  . Smoking status: Former Smoker    Quit date: 03/02/1968    Years since quitting: 52.5  . Smokeless tobacco: Never Used  Vaping Use  . Vaping Use: Never used  Substance and Sexual Activity  . Alcohol use: Yes    Alcohol/week: 4.0 standard drinks    Types: 2 Glasses of wine, 2 Cans of beer per week    Comment: occasionally  . Drug use: No  . Sexual activity: Not on file  Other Topics Concern  . Not on file  Social History Narrative  . Not on file   Social Determinants of Health   Financial Resource Strain: Not on file  Food Insecurity: Not on file  Transportation Needs: Not on file  Physical Activity: Not on file  Stress: Not on file  Social Connections: Not on file  Intimate Partner Violence: Not on file    Review of Systems: See HPI, otherwise negative ROS  Physical Exam: BP (!) 149/91   Pulse 69   Temp (!) 97.5 F (36.4 C) (Temporal)   Resp 18   Ht 5\' 2"  (1.575 m)   Wt 86.6 kg   SpO2 98%   BMI 34.93 kg/m  General:   Alert,  pleasant and cooperative in NAD Head:  Normocephalic and atraumatic. Lungs:  Clear to auscultation.    Heart:  Regular rate and rhythm.   Impression/Plan: Mary Elliott is here for ophthalmic surgery.  Risks, benefits, limitations, and alternatives regarding ophthalmic surgery have  been reviewed with the patient.  Questions have been answered.  All parties agreeable.   Leandrew Koyanagi, MD  09/09/2020, 12:34 PM

## 2020-09-09 NOTE — Anesthesia Preprocedure Evaluation (Signed)
Anesthesia Evaluation  Patient identified by MRN, date of birth, ID band Patient awake    History of Anesthesia Complications Negative for: history of anesthetic complications  Airway Mallampati: II  TM Distance: >3 FB Neck ROM: Full    Dental no notable dental hx.    Pulmonary former smoker,    Pulmonary exam normal        Cardiovascular hypertension, Pt. on medications and Pt. on home beta blockers Normal cardiovascular exam     Neuro/Psych negative neurological ROS     GI/Hepatic   Endo/Other  Hypothyroidism BMI 35  Renal/GU      Musculoskeletal   Abdominal   Peds  Hematology   Anesthesia Other Findings   Reproductive/Obstetrics                            Anesthesia Physical Anesthesia Plan  ASA: II  Anesthesia Plan: MAC   Post-op Pain Management:    Induction: Intravenous  PONV Risk Score and Plan: 2 and TIVA, Midazolam and Treatment may vary due to age or medical condition  Airway Management Planned: Nasal Cannula and Natural Airway  Additional Equipment: None  Intra-op Plan:   Post-operative Plan:   Informed Consent: I have reviewed the patients History and Physical, chart, labs and discussed the procedure including the risks, benefits and alternatives for the proposed anesthesia with the patient or authorized representative who has indicated his/her understanding and acceptance.       Plan Discussed with: CRNA  Anesthesia Plan Comments:         Anesthesia Quick Evaluation

## 2020-09-09 NOTE — Anesthesia Procedure Notes (Signed)
Procedure Name: MAC Date/Time: 09/09/2020 1:04 PM Performed by: Vanetta Shawl, CRNA Pre-anesthesia Checklist: Patient identified, Emergency Drugs available, Suction available, Timeout performed and Patient being monitored Patient Re-evaluated:Patient Re-evaluated prior to induction Oxygen Delivery Method: Nasal cannula Placement Confirmation: positive ETCO2

## 2020-09-09 NOTE — Anesthesia Postprocedure Evaluation (Signed)
Anesthesia Post Note  Patient: [name]  Procedure(s) Performed: CATARACT EXTRACTION PHACO AND INTRAOCULAR LENS PLACEMENT ([facility]) RIGHT PANOPTIX TORIC 8.54 01:03.3 13.5% (Right Eye)     Patient location during evaluation: PACU Anesthesia Type: MAC Level of consciousness: awake and alert Pain management: pain level controlled Vital Signs Assessment: post-procedure vital signs reviewed and stable Respiratory status: spontaneous breathing Cardiovascular status: blood pressure returned to baseline Postop Assessment: no apparent nausea or vomiting, adequate PO intake and no headache Anesthetic complications: no   No complications documented.  Mary Elliott Mary Elliott

## 2020-09-10 ENCOUNTER — Encounter: Payer: Self-pay | Admitting: Ophthalmology

## 2020-10-13 DIAGNOSIS — F411 Generalized anxiety disorder: Secondary | ICD-10-CM | POA: Diagnosis not present

## 2020-10-13 DIAGNOSIS — F330 Major depressive disorder, recurrent, mild: Secondary | ICD-10-CM | POA: Diagnosis not present

## 2020-10-19 DIAGNOSIS — N3941 Urge incontinence: Secondary | ICD-10-CM | POA: Diagnosis not present

## 2020-10-27 DIAGNOSIS — F411 Generalized anxiety disorder: Secondary | ICD-10-CM | POA: Diagnosis not present

## 2020-10-27 DIAGNOSIS — F330 Major depressive disorder, recurrent, mild: Secondary | ICD-10-CM | POA: Diagnosis not present

## 2020-11-12 DIAGNOSIS — L237 Allergic contact dermatitis due to plants, except food: Secondary | ICD-10-CM | POA: Diagnosis not present

## 2020-11-16 DIAGNOSIS — F410 Panic disorder [episodic paroxysmal anxiety] without agoraphobia: Secondary | ICD-10-CM | POA: Diagnosis not present

## 2020-11-16 DIAGNOSIS — F411 Generalized anxiety disorder: Secondary | ICD-10-CM | POA: Diagnosis not present

## 2020-11-16 DIAGNOSIS — F3342 Major depressive disorder, recurrent, in full remission: Secondary | ICD-10-CM | POA: Diagnosis not present

## 2020-12-01 DIAGNOSIS — F411 Generalized anxiety disorder: Secondary | ICD-10-CM | POA: Diagnosis not present

## 2020-12-01 DIAGNOSIS — F330 Major depressive disorder, recurrent, mild: Secondary | ICD-10-CM | POA: Diagnosis not present

## 2020-12-16 DIAGNOSIS — H903 Sensorineural hearing loss, bilateral: Secondary | ICD-10-CM | POA: Diagnosis not present

## 2020-12-25 DIAGNOSIS — K5792 Diverticulitis of intestine, part unspecified, without perforation or abscess without bleeding: Secondary | ICD-10-CM | POA: Diagnosis not present

## 2020-12-25 DIAGNOSIS — N760 Acute vaginitis: Secondary | ICD-10-CM | POA: Diagnosis not present

## 2021-01-06 DIAGNOSIS — H1132 Conjunctival hemorrhage, left eye: Secondary | ICD-10-CM | POA: Diagnosis not present

## 2021-01-15 DIAGNOSIS — M47816 Spondylosis without myelopathy or radiculopathy, lumbar region: Secondary | ICD-10-CM | POA: Diagnosis not present

## 2021-01-15 DIAGNOSIS — K219 Gastro-esophageal reflux disease without esophagitis: Secondary | ICD-10-CM | POA: Diagnosis not present

## 2021-01-15 DIAGNOSIS — E034 Atrophy of thyroid (acquired): Secondary | ICD-10-CM | POA: Diagnosis not present

## 2021-01-15 DIAGNOSIS — E7800 Pure hypercholesterolemia, unspecified: Secondary | ICD-10-CM | POA: Diagnosis not present

## 2021-01-15 DIAGNOSIS — I10 Essential (primary) hypertension: Secondary | ICD-10-CM | POA: Diagnosis not present

## 2021-01-22 DIAGNOSIS — E034 Atrophy of thyroid (acquired): Secondary | ICD-10-CM | POA: Diagnosis not present

## 2021-01-22 DIAGNOSIS — Z0000 Encounter for general adult medical examination without abnormal findings: Secondary | ICD-10-CM | POA: Diagnosis not present

## 2021-01-22 DIAGNOSIS — E7800 Pure hypercholesterolemia, unspecified: Secondary | ICD-10-CM | POA: Diagnosis not present

## 2021-01-22 DIAGNOSIS — G629 Polyneuropathy, unspecified: Secondary | ICD-10-CM | POA: Diagnosis not present

## 2021-01-22 DIAGNOSIS — M47816 Spondylosis without myelopathy or radiculopathy, lumbar region: Secondary | ICD-10-CM | POA: Diagnosis not present

## 2021-01-22 DIAGNOSIS — F3341 Major depressive disorder, recurrent, in partial remission: Secondary | ICD-10-CM | POA: Diagnosis not present

## 2021-01-22 DIAGNOSIS — I10 Essential (primary) hypertension: Secondary | ICD-10-CM | POA: Diagnosis not present

## 2021-02-04 ENCOUNTER — Other Ambulatory Visit: Payer: Self-pay | Admitting: Internal Medicine

## 2021-02-04 DIAGNOSIS — Z1231 Encounter for screening mammogram for malignant neoplasm of breast: Secondary | ICD-10-CM

## 2021-02-25 ENCOUNTER — Ambulatory Visit
Admission: RE | Admit: 2021-02-25 | Discharge: 2021-02-25 | Disposition: A | Discharge status: Home / Self Care | Payer: PPO | Source: Ambulatory Visit | Attending: Internal Medicine | Admitting: Internal Medicine

## 2021-02-25 ENCOUNTER — Other Ambulatory Visit: Payer: Self-pay

## 2021-02-25 DIAGNOSIS — Z1231 Encounter for screening mammogram for malignant neoplasm of breast: Secondary | ICD-10-CM | POA: Diagnosis not present

## 2021-03-02 DIAGNOSIS — M75101 Unspecified rotator cuff tear or rupture of right shoulder, not specified as traumatic: Secondary | ICD-10-CM | POA: Diagnosis not present

## 2021-03-10 DIAGNOSIS — F411 Generalized anxiety disorder: Secondary | ICD-10-CM | POA: Diagnosis not present

## 2021-03-10 DIAGNOSIS — F410 Panic disorder [episodic paroxysmal anxiety] without agoraphobia: Secondary | ICD-10-CM | POA: Diagnosis not present

## 2021-03-10 DIAGNOSIS — F3342 Major depressive disorder, recurrent, in full remission: Secondary | ICD-10-CM | POA: Diagnosis not present

## 2021-04-09 DIAGNOSIS — F411 Generalized anxiety disorder: Secondary | ICD-10-CM | POA: Diagnosis not present

## 2021-04-09 DIAGNOSIS — F410 Panic disorder [episodic paroxysmal anxiety] without agoraphobia: Secondary | ICD-10-CM | POA: Diagnosis not present

## 2021-04-09 DIAGNOSIS — F3342 Major depressive disorder, recurrent, in full remission: Secondary | ICD-10-CM | POA: Diagnosis not present

## 2021-04-23 DIAGNOSIS — H04123 Dry eye syndrome of bilateral lacrimal glands: Secondary | ICD-10-CM | POA: Diagnosis not present

## 2021-04-27 DIAGNOSIS — D2262 Melanocytic nevi of left upper limb, including shoulder: Secondary | ICD-10-CM | POA: Diagnosis not present

## 2021-04-27 DIAGNOSIS — Z85828 Personal history of other malignant neoplasm of skin: Secondary | ICD-10-CM | POA: Diagnosis not present

## 2021-04-27 DIAGNOSIS — L718 Other rosacea: Secondary | ICD-10-CM | POA: Diagnosis not present

## 2021-04-27 DIAGNOSIS — X32XXXA Exposure to sunlight, initial encounter: Secondary | ICD-10-CM | POA: Diagnosis not present

## 2021-04-27 DIAGNOSIS — D225 Melanocytic nevi of trunk: Secondary | ICD-10-CM | POA: Diagnosis not present

## 2021-04-27 DIAGNOSIS — L570 Actinic keratosis: Secondary | ICD-10-CM | POA: Diagnosis not present

## 2021-04-27 DIAGNOSIS — D2272 Melanocytic nevi of left lower limb, including hip: Secondary | ICD-10-CM | POA: Diagnosis not present

## 2021-06-23 DIAGNOSIS — F410 Panic disorder [episodic paroxysmal anxiety] without agoraphobia: Secondary | ICD-10-CM | POA: Diagnosis not present

## 2021-06-23 DIAGNOSIS — F411 Generalized anxiety disorder: Secondary | ICD-10-CM | POA: Diagnosis not present

## 2021-06-23 DIAGNOSIS — F3342 Major depressive disorder, recurrent, in full remission: Secondary | ICD-10-CM | POA: Diagnosis not present

## 2021-06-25 DIAGNOSIS — M75101 Unspecified rotator cuff tear or rupture of right shoulder, not specified as traumatic: Secondary | ICD-10-CM | POA: Diagnosis not present

## 2021-06-25 DIAGNOSIS — M62838 Other muscle spasm: Secondary | ICD-10-CM | POA: Diagnosis not present

## 2021-07-01 DIAGNOSIS — M75101 Unspecified rotator cuff tear or rupture of right shoulder, not specified as traumatic: Secondary | ICD-10-CM | POA: Diagnosis not present

## 2021-07-07 DIAGNOSIS — M25411 Effusion, right shoulder: Secondary | ICD-10-CM | POA: Diagnosis not present

## 2021-07-07 DIAGNOSIS — M25511 Pain in right shoulder: Secondary | ICD-10-CM | POA: Diagnosis not present

## 2021-07-09 IMAGING — MG DIGITAL SCREENING BILAT W/ TOMO W/ CAD
8 series · 8 of 24 positions shown · non-contrast
Comparison: Previous exam(s).

CLINICAL DATA: Screening.

EXAM:
DIGITAL SCREENING BILATERAL MAMMOGRAM WITH TOMO AND CAD

[R MLO synth-2D]
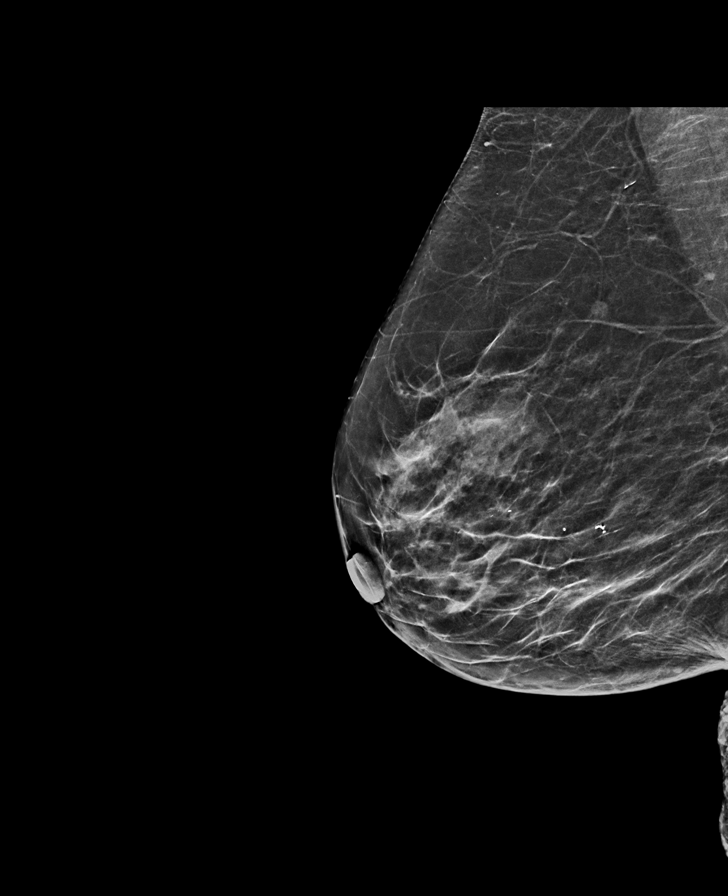

[L CC synth-2D]
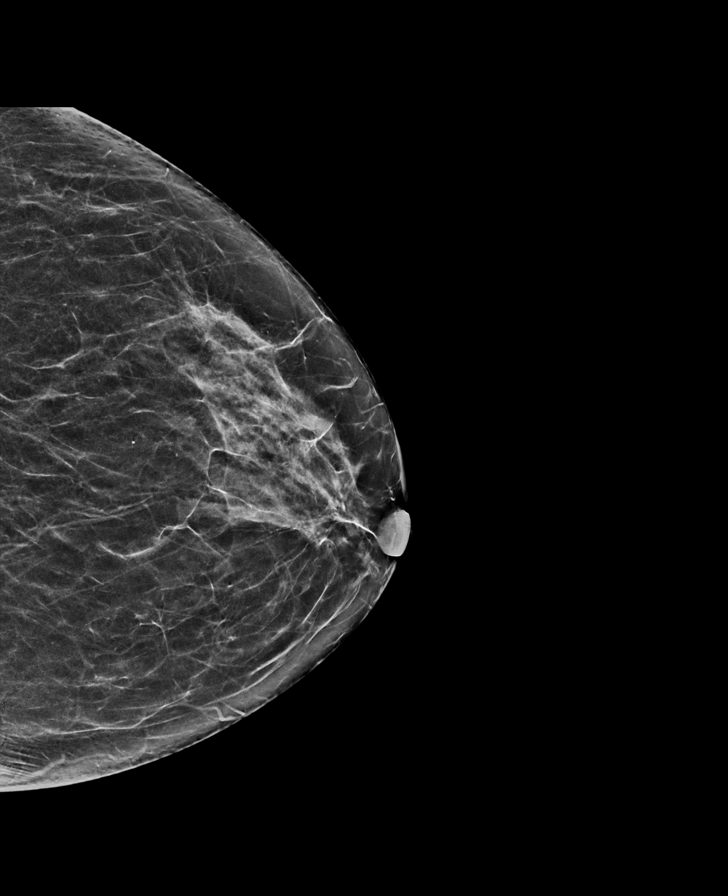

[L MLO synth-2D]
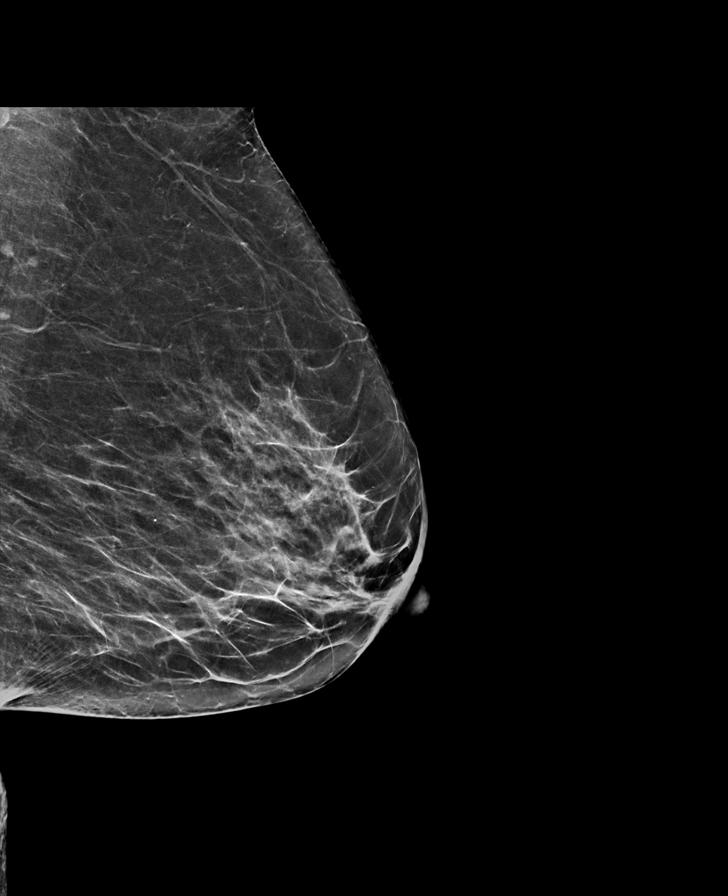

[R CC synth-2D]
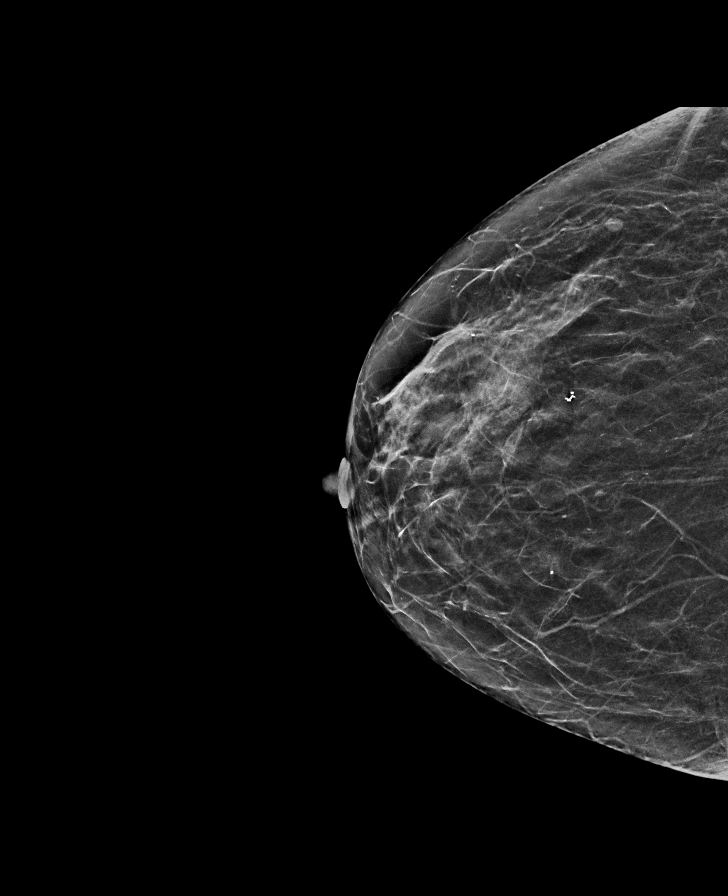

[R MLO tomo · tomo slice 31/61.0]
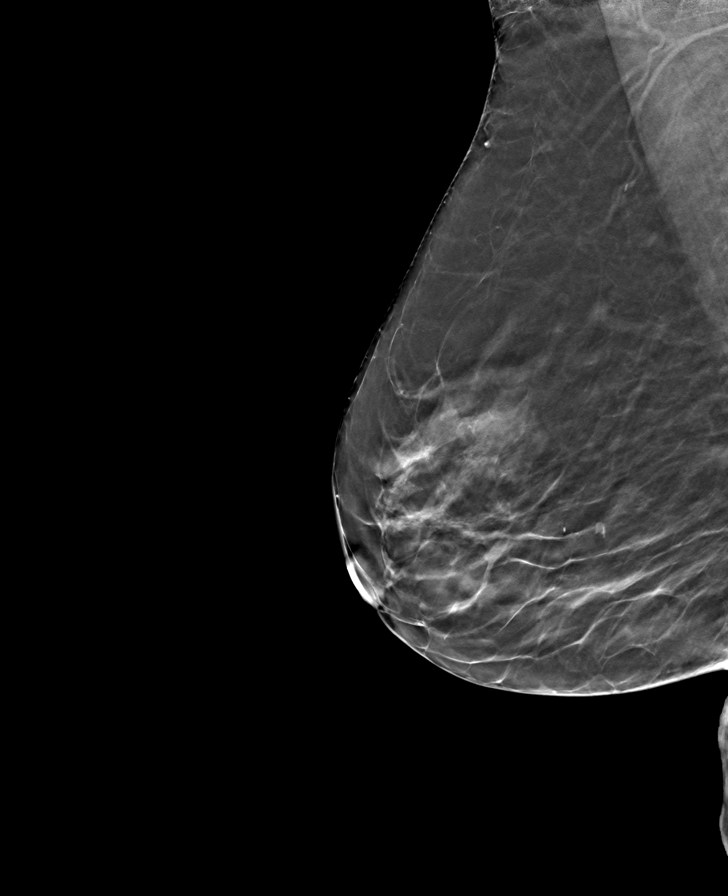

[L MLO tomo · tomo slice 33/64.0]
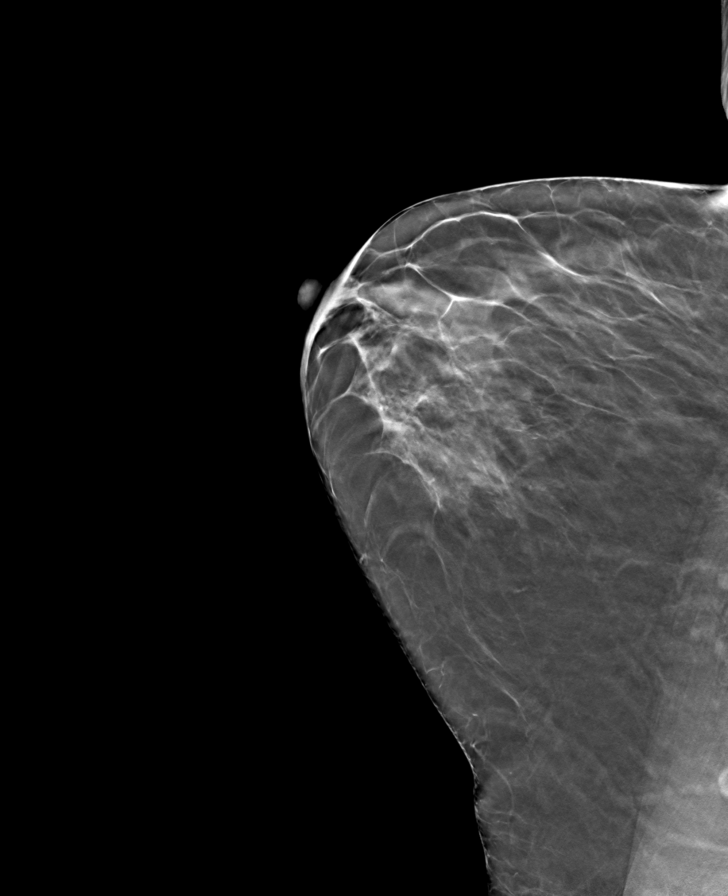

[L CC tomo · tomo slice 30/59.0]
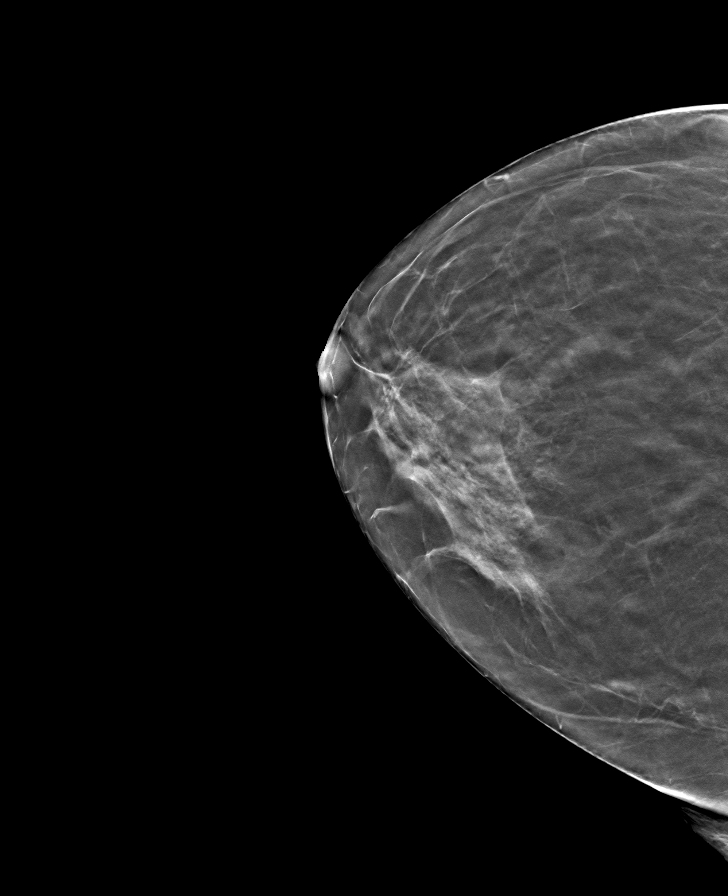

[R CC tomo · tomo slice 29/56.0]
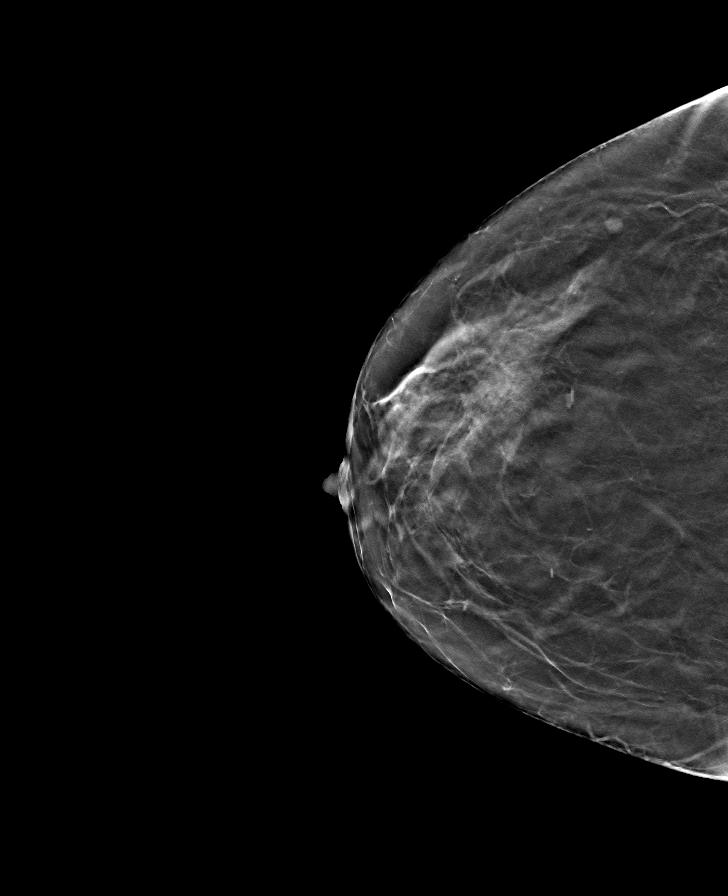

[8 of 24 positions shown; findings below may reference images not displayed]

ACR Breast Density Category b: There are scattered areas of
fibroglandular density.
FINDINGS: There are no findings suspicious for malignancy. Images were
processed with CAD.
IMPRESSION: No mammographic evidence of malignancy. A result letter of this
screening mammogram will be mailed directly to the patient.

RECOMMENDATION:
Screening mammogram in one year. (Code:[id number])

BI-RADS CATEGORY  1: Negative.

## 2021-07-12 DIAGNOSIS — E7800 Pure hypercholesterolemia, unspecified: Secondary | ICD-10-CM | POA: Diagnosis not present

## 2021-07-12 DIAGNOSIS — M25411 Effusion, right shoulder: Secondary | ICD-10-CM | POA: Diagnosis not present

## 2021-07-12 DIAGNOSIS — M25511 Pain in right shoulder: Secondary | ICD-10-CM | POA: Diagnosis not present

## 2021-07-12 DIAGNOSIS — G629 Polyneuropathy, unspecified: Secondary | ICD-10-CM | POA: Diagnosis not present

## 2021-07-12 DIAGNOSIS — M47816 Spondylosis without myelopathy or radiculopathy, lumbar region: Secondary | ICD-10-CM | POA: Diagnosis not present

## 2021-07-12 DIAGNOSIS — I10 Essential (primary) hypertension: Secondary | ICD-10-CM | POA: Diagnosis not present

## 2021-07-16 DIAGNOSIS — M25411 Effusion, right shoulder: Secondary | ICD-10-CM | POA: Diagnosis not present

## 2021-07-16 DIAGNOSIS — M25511 Pain in right shoulder: Secondary | ICD-10-CM | POA: Diagnosis not present

## 2021-07-19 DIAGNOSIS — G629 Polyneuropathy, unspecified: Secondary | ICD-10-CM | POA: Diagnosis not present

## 2021-07-19 DIAGNOSIS — I10 Essential (primary) hypertension: Secondary | ICD-10-CM | POA: Diagnosis not present

## 2021-07-19 DIAGNOSIS — M25411 Effusion, right shoulder: Secondary | ICD-10-CM | POA: Diagnosis not present

## 2021-07-19 DIAGNOSIS — M47816 Spondylosis without myelopathy or radiculopathy, lumbar region: Secondary | ICD-10-CM | POA: Diagnosis not present

## 2021-07-19 DIAGNOSIS — K219 Gastro-esophageal reflux disease without esophagitis: Secondary | ICD-10-CM | POA: Diagnosis not present

## 2021-07-19 DIAGNOSIS — E034 Atrophy of thyroid (acquired): Secondary | ICD-10-CM | POA: Diagnosis not present

## 2021-07-19 DIAGNOSIS — F3341 Major depressive disorder, recurrent, in partial remission: Secondary | ICD-10-CM | POA: Diagnosis not present

## 2021-07-19 DIAGNOSIS — E7800 Pure hypercholesterolemia, unspecified: Secondary | ICD-10-CM | POA: Diagnosis not present

## 2021-07-19 DIAGNOSIS — M25511 Pain in right shoulder: Secondary | ICD-10-CM | POA: Diagnosis not present

## 2021-07-23 DIAGNOSIS — M25411 Effusion, right shoulder: Secondary | ICD-10-CM | POA: Diagnosis not present

## 2021-07-23 DIAGNOSIS — M25511 Pain in right shoulder: Secondary | ICD-10-CM | POA: Diagnosis not present

## 2021-07-27 DIAGNOSIS — M25411 Effusion, right shoulder: Secondary | ICD-10-CM | POA: Diagnosis not present

## 2021-07-27 DIAGNOSIS — M25511 Pain in right shoulder: Secondary | ICD-10-CM | POA: Diagnosis not present

## 2021-07-30 DIAGNOSIS — M25511 Pain in right shoulder: Secondary | ICD-10-CM | POA: Diagnosis not present

## 2021-07-30 DIAGNOSIS — M25411 Effusion, right shoulder: Secondary | ICD-10-CM | POA: Diagnosis not present

## 2021-08-17 DIAGNOSIS — M25511 Pain in right shoulder: Secondary | ICD-10-CM | POA: Diagnosis not present

## 2021-08-17 DIAGNOSIS — M25411 Effusion, right shoulder: Secondary | ICD-10-CM | POA: Diagnosis not present

## 2021-08-20 DIAGNOSIS — M25511 Pain in right shoulder: Secondary | ICD-10-CM | POA: Diagnosis not present

## 2021-08-20 DIAGNOSIS — M25411 Effusion, right shoulder: Secondary | ICD-10-CM | POA: Diagnosis not present

## 2021-09-16 DIAGNOSIS — F410 Panic disorder [episodic paroxysmal anxiety] without agoraphobia: Secondary | ICD-10-CM | POA: Diagnosis not present

## 2021-09-16 DIAGNOSIS — F3342 Major depressive disorder, recurrent, in full remission: Secondary | ICD-10-CM | POA: Diagnosis not present

## 2021-09-16 DIAGNOSIS — F411 Generalized anxiety disorder: Secondary | ICD-10-CM | POA: Diagnosis not present

## 2021-09-30 DIAGNOSIS — F410 Panic disorder [episodic paroxysmal anxiety] without agoraphobia: Secondary | ICD-10-CM | POA: Diagnosis not present

## 2021-09-30 DIAGNOSIS — F411 Generalized anxiety disorder: Secondary | ICD-10-CM | POA: Diagnosis not present

## 2021-09-30 DIAGNOSIS — F3342 Major depressive disorder, recurrent, in full remission: Secondary | ICD-10-CM | POA: Diagnosis not present

## 2021-10-12 DIAGNOSIS — M5416 Radiculopathy, lumbar region: Secondary | ICD-10-CM | POA: Diagnosis not present

## 2021-10-12 DIAGNOSIS — K219 Gastro-esophageal reflux disease without esophagitis: Secondary | ICD-10-CM | POA: Diagnosis not present

## 2021-10-12 DIAGNOSIS — R109 Unspecified abdominal pain: Secondary | ICD-10-CM | POA: Diagnosis not present

## 2021-10-12 DIAGNOSIS — I10 Essential (primary) hypertension: Secondary | ICD-10-CM | POA: Diagnosis not present

## 2021-10-12 DIAGNOSIS — R1084 Generalized abdominal pain: Secondary | ICD-10-CM | POA: Diagnosis not present

## 2021-11-17 DIAGNOSIS — F3342 Major depressive disorder, recurrent, in full remission: Secondary | ICD-10-CM | POA: Diagnosis not present

## 2021-11-17 DIAGNOSIS — F411 Generalized anxiety disorder: Secondary | ICD-10-CM | POA: Diagnosis not present

## 2021-11-17 DIAGNOSIS — F410 Panic disorder [episodic paroxysmal anxiety] without agoraphobia: Secondary | ICD-10-CM | POA: Diagnosis not present

## 2022-01-19 DIAGNOSIS — I10 Essential (primary) hypertension: Secondary | ICD-10-CM | POA: Diagnosis not present

## 2022-01-19 DIAGNOSIS — E7800 Pure hypercholesterolemia, unspecified: Secondary | ICD-10-CM | POA: Diagnosis not present

## 2022-01-19 DIAGNOSIS — M47816 Spondylosis without myelopathy or radiculopathy, lumbar region: Secondary | ICD-10-CM | POA: Diagnosis not present

## 2022-01-19 DIAGNOSIS — K219 Gastro-esophageal reflux disease without esophagitis: Secondary | ICD-10-CM | POA: Diagnosis not present

## 2022-01-27 DIAGNOSIS — F3341 Major depressive disorder, recurrent, in partial remission: Secondary | ICD-10-CM | POA: Diagnosis not present

## 2022-01-27 DIAGNOSIS — K219 Gastro-esophageal reflux disease without esophagitis: Secondary | ICD-10-CM | POA: Diagnosis not present

## 2022-01-27 DIAGNOSIS — G629 Polyneuropathy, unspecified: Secondary | ICD-10-CM | POA: Diagnosis not present

## 2022-01-27 DIAGNOSIS — F419 Anxiety disorder, unspecified: Secondary | ICD-10-CM | POA: Diagnosis not present

## 2022-01-27 DIAGNOSIS — E034 Atrophy of thyroid (acquired): Secondary | ICD-10-CM | POA: Diagnosis not present

## 2022-01-27 DIAGNOSIS — E7800 Pure hypercholesterolemia, unspecified: Secondary | ICD-10-CM | POA: Diagnosis not present

## 2022-01-27 DIAGNOSIS — M4316 Spondylolisthesis, lumbar region: Secondary | ICD-10-CM | POA: Diagnosis not present

## 2022-01-27 DIAGNOSIS — Z0000 Encounter for general adult medical examination without abnormal findings: Secondary | ICD-10-CM | POA: Diagnosis not present

## 2022-01-27 DIAGNOSIS — I10 Essential (primary) hypertension: Secondary | ICD-10-CM | POA: Diagnosis not present

## 2022-01-28 ENCOUNTER — Other Ambulatory Visit: Payer: Self-pay | Admitting: Internal Medicine

## 2022-01-28 DIAGNOSIS — Z1231 Encounter for screening mammogram for malignant neoplasm of breast: Secondary | ICD-10-CM

## 2022-02-07 DIAGNOSIS — F3342 Major depressive disorder, recurrent, in full remission: Secondary | ICD-10-CM | POA: Diagnosis not present

## 2022-02-07 DIAGNOSIS — F410 Panic disorder [episodic paroxysmal anxiety] without agoraphobia: Secondary | ICD-10-CM | POA: Diagnosis not present

## 2022-02-07 DIAGNOSIS — F411 Generalized anxiety disorder: Secondary | ICD-10-CM | POA: Diagnosis not present

## 2022-02-14 DIAGNOSIS — H168 Other keratitis: Secondary | ICD-10-CM | POA: Diagnosis not present

## 2022-03-02 ENCOUNTER — Ambulatory Visit
Admission: RE | Admit: 2022-03-02 | Discharge: 2022-03-02 | Disposition: A | Discharge status: Home / Self Care | Payer: PPO | Source: Ambulatory Visit | Attending: Internal Medicine | Admitting: Internal Medicine

## 2022-03-02 DIAGNOSIS — Z1231 Encounter for screening mammogram for malignant neoplasm of breast: Secondary | ICD-10-CM | POA: Diagnosis not present

## 2022-03-03 DIAGNOSIS — F3342 Major depressive disorder, recurrent, in full remission: Secondary | ICD-10-CM | POA: Diagnosis not present

## 2022-03-03 DIAGNOSIS — F410 Panic disorder [episodic paroxysmal anxiety] without agoraphobia: Secondary | ICD-10-CM | POA: Diagnosis not present

## 2022-03-03 DIAGNOSIS — F411 Generalized anxiety disorder: Secondary | ICD-10-CM | POA: Diagnosis not present

## 2022-03-04 DIAGNOSIS — H169 Unspecified keratitis: Secondary | ICD-10-CM | POA: Diagnosis not present

## 2022-04-11 DIAGNOSIS — M25572 Pain in left ankle and joints of left foot: Secondary | ICD-10-CM | POA: Diagnosis not present

## 2022-04-11 DIAGNOSIS — I872 Venous insufficiency (chronic) (peripheral): Secondary | ICD-10-CM | POA: Diagnosis not present

## 2022-04-11 DIAGNOSIS — M2041 Other hammer toe(s) (acquired), right foot: Secondary | ICD-10-CM | POA: Diagnosis not present

## 2022-04-11 DIAGNOSIS — G629 Polyneuropathy, unspecified: Secondary | ICD-10-CM | POA: Diagnosis not present

## 2022-04-11 DIAGNOSIS — M2141 Flat foot [pes planus] (acquired), right foot: Secondary | ICD-10-CM | POA: Diagnosis not present

## 2022-04-11 DIAGNOSIS — M2042 Other hammer toe(s) (acquired), left foot: Secondary | ICD-10-CM | POA: Diagnosis not present

## 2022-04-11 DIAGNOSIS — M216X1 Other acquired deformities of right foot: Secondary | ICD-10-CM | POA: Diagnosis not present

## 2022-04-11 DIAGNOSIS — M216X2 Other acquired deformities of left foot: Secondary | ICD-10-CM | POA: Diagnosis not present

## 2022-04-11 DIAGNOSIS — M2142 Flat foot [pes planus] (acquired), left foot: Secondary | ICD-10-CM | POA: Diagnosis not present

## 2022-04-22 DIAGNOSIS — H1132 Conjunctival hemorrhage, left eye: Secondary | ICD-10-CM | POA: Diagnosis not present

## 2022-05-07 DIAGNOSIS — K5792 Diverticulitis of intestine, part unspecified, without perforation or abscess without bleeding: Secondary | ICD-10-CM | POA: Diagnosis not present

## 2022-05-08 ENCOUNTER — Encounter: Payer: Self-pay | Admitting: Surgery

## 2022-05-08 ENCOUNTER — Other Ambulatory Visit: Payer: Self-pay

## 2022-05-08 ENCOUNTER — Emergency Department: Payer: PPO

## 2022-05-08 ENCOUNTER — Inpatient Hospital Stay
Admission: EM | Admit: 2022-05-08 | Discharge: 2022-05-13 | DRG: 391 | Disposition: A | Discharge status: Home / Self Care | Payer: PPO | Attending: Surgery | Admitting: Surgery

## 2022-05-08 DIAGNOSIS — Z881 Allergy status to other antibiotic agents status: Secondary | ICD-10-CM

## 2022-05-08 DIAGNOSIS — Z79890 Hormone replacement therapy (postmenopausal): Secondary | ICD-10-CM

## 2022-05-08 DIAGNOSIS — K5720 Diverticulitis of large intestine with perforation and abscess without bleeding: Principal | ICD-10-CM | POA: Diagnosis present

## 2022-05-08 DIAGNOSIS — Z87891 Personal history of nicotine dependence: Secondary | ICD-10-CM

## 2022-05-08 DIAGNOSIS — K5732 Diverticulitis of large intestine without perforation or abscess without bleeding: Secondary | ICD-10-CM | POA: Diagnosis present

## 2022-05-08 DIAGNOSIS — Z79899 Other long term (current) drug therapy: Secondary | ICD-10-CM

## 2022-05-08 DIAGNOSIS — Z85828 Personal history of other malignant neoplasm of skin: Secondary | ICD-10-CM

## 2022-05-08 DIAGNOSIS — Z79818 Long term (current) use of other agents affecting estrogen receptors and estrogen levels: Secondary | ICD-10-CM

## 2022-05-08 DIAGNOSIS — K219 Gastro-esophageal reflux disease without esophagitis: Secondary | ICD-10-CM | POA: Diagnosis present

## 2022-05-08 DIAGNOSIS — E039 Hypothyroidism, unspecified: Secondary | ICD-10-CM | POA: Diagnosis present

## 2022-05-08 DIAGNOSIS — K5900 Constipation, unspecified: Secondary | ICD-10-CM | POA: Diagnosis present

## 2022-05-08 DIAGNOSIS — Z880 Allergy status to penicillin: Secondary | ICD-10-CM

## 2022-05-08 DIAGNOSIS — K651 Peritoneal abscess: Secondary | ICD-10-CM | POA: Diagnosis present

## 2022-05-08 DIAGNOSIS — Z96653 Presence of artificial knee joint, bilateral: Secondary | ICD-10-CM | POA: Diagnosis not present

## 2022-05-08 DIAGNOSIS — Z886 Allergy status to analgesic agent status: Secondary | ICD-10-CM

## 2022-05-08 DIAGNOSIS — Z888 Allergy status to other drugs, medicaments and biological substances status: Secondary | ICD-10-CM

## 2022-05-08 DIAGNOSIS — I10 Essential (primary) hypertension: Secondary | ICD-10-CM | POA: Diagnosis not present

## 2022-05-08 DIAGNOSIS — N3289 Other specified disorders of bladder: Secondary | ICD-10-CM | POA: Diagnosis not present

## 2022-05-08 DIAGNOSIS — Z882 Allergy status to sulfonamides status: Secondary | ICD-10-CM | POA: Diagnosis not present

## 2022-05-08 LAB — CBC WITH DIFFERENTIAL/PLATELET
Abs Immature Granulocytes: 0.08 10*3/uL — ABNORMAL HIGH (ref 0.00–0.07)
Basophils Absolute: 0 10*3/uL (ref 0.0–0.1)
Basophils Relative: 0 %
Eosinophils Absolute: 0 10*3/uL (ref 0.0–0.5)
Eosinophils Relative: 0 %
HCT: 40.6 % (ref 36.0–46.0)
Hemoglobin: 13.5 g/dL (ref 12.0–15.0)
Immature Granulocytes: 1 %
Lymphocytes Relative: 6 %
Lymphs Abs: 0.8 10*3/uL (ref 0.7–4.0)
MCH: 30.5 pg (ref 26.0–34.0)
MCHC: 33.3 g/dL (ref 30.0–36.0)
MCV: 91.9 fL (ref 80.0–100.0)
Monocytes Absolute: 0.6 10*3/uL (ref 0.1–1.0)
Monocytes Relative: 4 %
Neutro Abs: 13 10*3/uL — ABNORMAL HIGH (ref 1.7–7.7)
Neutrophils Relative %: 89 %
Platelets: 261 10*3/uL (ref 150–400)
RBC: 4.42 MIL/uL (ref 3.87–5.11)
RDW: 14.1 % (ref 11.5–15.5)
WBC: 14.5 10*3/uL — ABNORMAL HIGH (ref 4.0–10.5)
nRBC: 0 % (ref 0.0–0.2)

## 2022-05-08 LAB — CBC
HCT: 35.4 % — ABNORMAL LOW (ref 36.0–46.0)
Hemoglobin: 11.7 g/dL — ABNORMAL LOW (ref 12.0–15.0)
MCH: 30.8 pg (ref 26.0–34.0)
MCHC: 33.1 g/dL (ref 30.0–36.0)
MCV: 93.2 fL (ref 80.0–100.0)
Platelets: 224 K/uL (ref 150–400)
RBC: 3.8 MIL/uL — ABNORMAL LOW (ref 3.87–5.11)
RDW: 14.2 % (ref 11.5–15.5)
WBC: 14.4 K/uL — ABNORMAL HIGH (ref 4.0–10.5)
nRBC: 0 % (ref 0.0–0.2)

## 2022-05-08 LAB — COMPREHENSIVE METABOLIC PANEL
ALT: 12 U/L (ref 0–44)
AST: 21 U/L (ref 15–41)
Albumin: 4.1 g/dL (ref 3.5–5.0)
Alkaline Phosphatase: 67 U/L (ref 38–126)
Anion gap: 7 (ref 5–15)
BUN: 17 mg/dL (ref 8–23)
CO2: 24 mmol/L (ref 22–32)
Calcium: 8.8 mg/dL — ABNORMAL LOW (ref 8.9–10.3)
Chloride: 104 mmol/L (ref 98–111)
Creatinine, Ser: 0.92 mg/dL (ref 0.44–1.00)
GFR, Estimated: >60 mL/min (ref >60)
Glucose, Bld: 129 mg/dL — ABNORMAL HIGH (ref 70–99)
Potassium: 3.7 mmol/L (ref 3.5–5.1)
Sodium: 135 mmol/L (ref 135–145)
Total Bilirubin: 1.3 mg/dL — ABNORMAL HIGH (ref 0.3–1.2)
Total Protein: 7.5 g/dL (ref 6.5–8.1)

## 2022-05-08 LAB — URINALYSIS, ROUTINE W REFLEX MICROSCOPIC
Bacteria, UA: NONE SEEN
Bilirubin Urine: NEGATIVE
Glucose, UA: NEGATIVE mg/dL
Hgb urine dipstick: NEGATIVE
Ketones, ur: 5 mg/dL — AB
Nitrite: NEGATIVE
Protein, ur: 30 mg/dL — AB
Specific Gravity, Urine: 1.011 (ref 1.005–1.030)
pH: 5 (ref 5.0–8.0)

## 2022-05-08 LAB — LIPASE, BLOOD: Lipase: 32 U/L (ref 11–51)

## 2022-05-08 LAB — LACTIC ACID, PLASMA: Lactic Acid, Venous: 1.1 mmol/L (ref 0.5–1.9)

## 2022-05-08 MED ORDER — ONDANSETRON 4 MG PO TBDP: 4.0000 mg | ORAL_TABLET | Freq: Four times a day (QID) | ORAL | Status: DC | PRN

## 2022-05-08 MED ORDER — METOPROLOL SUCCINATE ER 50 MG PO TB24: 100.0000 mg | ORAL_TABLET | Freq: Every day | ORAL | Status: DC

## 2022-05-08 MED ORDER — SERTRALINE HCL 50 MG PO TABS: 50.0000 mg | ORAL_TABLET | Freq: Every day | ORAL | Status: DC

## 2022-05-08 MED ORDER — METRONIDAZOLE 500 MG/100ML IV SOLN: 500.0000 mg | Freq: Two times a day (BID) | INTRAVENOUS | Status: DC

## 2022-05-08 MED ORDER — SODIUM CHLORIDE 0.9 % IV SOLN: INTRAVENOUS | Status: DC

## 2022-05-08 MED ORDER — IOHEXOL 300 MG/ML  SOLN
100.0000 mL | Freq: Once | INTRAMUSCULAR | Status: AC | PRN
  Administered 2022-05-08: 100 mL via INTRAVENOUS

## 2022-05-08 MED ORDER — OXYCODONE HCL 5 MG PO TABS: 5.0000 mg | ORAL_TABLET | ORAL | Status: DC | PRN

## 2022-05-08 MED ORDER — ONDANSETRON HCL 4 MG/2ML IJ SOLN
4.0000 mg | Freq: Four times a day (QID) | INTRAMUSCULAR | Status: DC | PRN
  Administered 2022-05-08 – 2022-05-12 (×2): 4 mg via INTRAVENOUS
  Filled 2022-05-08 (×2): qty 2

## 2022-05-08 MED ORDER — LEVOTHYROXINE SODIUM 100 MCG PO TABS: 100.0000 ug | ORAL_TABLET | Freq: Every day | ORAL | Status: DC

## 2022-05-08 MED ORDER — MORPHINE SULFATE (PF) 2 MG/ML IV SOLN: 2.0000 mg | INTRAVENOUS | Status: DC | PRN

## 2022-05-08 MED ORDER — METOPROLOL SUCCINATE ER 100 MG PO TB24
100.0000 mg | ORAL_TABLET | Freq: Every day | ORAL | Status: DC
  Administered 2022-05-09 – 2022-05-13 (×5): 100 mg via ORAL
  Filled 2022-05-08 (×5): qty 1

## 2022-05-08 MED ORDER — METRONIDAZOLE 500 MG/100ML IV SOLN
500.0000 mg | Freq: Once | INTRAVENOUS | Status: AC
  Administered 2022-05-08: 500 mg via INTRAVENOUS
  Filled 2022-05-08: qty 100

## 2022-05-08 MED ORDER — SERTRALINE HCL 50 MG PO TABS
100.0000 mg | ORAL_TABLET | Freq: Every day | ORAL | Status: DC
  Administered 2022-05-09 – 2022-05-13 (×5): 100 mg via ORAL
  Filled 2022-05-08 (×5): qty 2

## 2022-05-08 MED ORDER — CIPROFLOXACIN IN D5W 400 MG/200ML IV SOLN
400.0000 mg | Freq: Once | INTRAVENOUS | Status: AC
  Administered 2022-05-08: 400 mg via INTRAVENOUS
  Filled 2022-05-08: qty 200

## 2022-05-08 MED ORDER — SODIUM CHLORIDE 0.9 % IV SOLN
2.0000 g | Freq: Two times a day (BID) | INTRAVENOUS | Status: DC
  Administered 2022-05-08 – 2022-05-13 (×11): 2 g via INTRAVENOUS
  Filled 2022-05-08: qty 2
  Filled 2022-05-08 (×2): qty 12.5
  Filled 2022-05-08: qty 2
  Filled 2022-05-08 (×2): qty 12.5
  Filled 2022-05-08: qty 2
  Filled 2022-05-08 (×2): qty 12.5
  Filled 2022-05-08: qty 2
  Filled 2022-05-08: qty 12.5
  Filled 2022-05-08: qty 2

## 2022-05-08 MED ORDER — ACETAMINOPHEN 325 MG PO TABS
650.0000 mg | ORAL_TABLET | Freq: Four times a day (QID) | ORAL | Status: DC | PRN
  Administered 2022-05-08 – 2022-05-13 (×7): 650 mg via ORAL
  Filled 2022-05-08 (×7): qty 2

## 2022-05-08 MED ORDER — FLUTICASONE PROPIONATE 50 MCG/ACT NA SUSP: 2.0000 | Freq: Every day | NASAL | Status: DC | PRN

## 2022-05-08 MED ORDER — SODIUM CHLORIDE 0.9 % IV SOLN: 2.0000 g | Freq: Three times a day (TID) | INTRAVENOUS | Status: DC

## 2022-05-08 MED ORDER — LEVOTHYROXINE SODIUM 100 MCG PO TABS
100.0000 ug | ORAL_TABLET | Freq: Every day | ORAL | Status: DC
  Administered 2022-05-09 – 2022-05-13 (×5): 100 ug via ORAL
  Filled 2022-05-08 (×5): qty 1

## 2022-05-08 MED ORDER — METRONIDAZOLE 500 MG/100ML IV SOLN
500.0000 mg | Freq: Two times a day (BID) | INTRAVENOUS | Status: DC
  Administered 2022-05-09 – 2022-05-13 (×9): 500 mg via INTRAVENOUS
  Filled 2022-05-08 (×11): qty 100

## 2022-05-08 MED ORDER — LORATADINE 10 MG PO TABS: 10.0000 mg | ORAL_TABLET | Freq: Every day | ORAL | Status: DC | PRN

## 2022-05-08 MED ORDER — SODIUM CHLORIDE 0.9 % IV SOLN: Freq: Once | INTRAVENOUS | Status: AC

## 2022-05-08 NOTE — H&P (Signed)
Subjective:   CC: Diverticulitis with abscess  HPI:  Mary Elliott is a 81 y.o. female who was consulted by Poggi for issue above.  Symptoms were first noted a few days ago. Pain is sharp suprapubic and left lower quadrant.  Associated with constipation, exacerbated by nothing specific.   Past Medical History:  has a past medical history of Anemia, Anxiety, Arthritis, Cancer ([place]) (1999), GERD (gastroesophageal reflux disease), History of kidney stones, HOH (hard of hearing), Hypertension, Hypothyroidism, PONV (postoperative nausea and vomiting), and Tinnitus.  Past Surgical History:  Past Surgical History:  Procedure Laterality Date   ABDOMINAL HYSTERECTOMY     basil cell     face   BLEPHAROPLASTY     CATARACT EXTRACTION W/PHACO Left 08/26/2020   Procedure: CATARACT EXTRACTION PHACO AND INTRAOCULAR LENS PLACEMENT ([place]) LEFT PANOPTIX TORIC;  Surgeon: Leandrew Koyanagi, MD;  Location: [facility];  Service: Ophthalmology;  Laterality: Left;  8.88 1.:27.2 10.1%   CATARACT EXTRACTION W/PHACO Right 09/09/2020   Procedure: CATARACT EXTRACTION PHACO AND INTRAOCULAR LENS PLACEMENT ([facility]) RIGHT PANOPTIX TORIC 8.54 01:03.3 13.5%;  Surgeon: Leandrew Koyanagi, MD;  Location: [facility];  Service: Ophthalmology;  Laterality: Right;   CYSTOCELE REPAIR  2005   HEMORROIDECTOMY     JOINT REPLACEMENT Bilateral 11/2016   KNEE ARTHROPLASTY Left 11/14/2016   Procedure: COMPUTER ASSISTED TOTAL KNEE ARTHROPLASTY;  Surgeon: Dereck Leep, MD;  Location: [facility];  Service: Orthopedics;  Laterality: Left;   KNEE ARTHROPLASTY Right 03/15/2017   Procedure: COMPUTER ASSISTED TOTAL KNEE ARTHROPLASTY;  Surgeon: Dereck Leep, MD;  Location: [facility];  Service: Orthopedics;  Laterality: Right;   KNEE ARTHROSCOPY Left    LIPOSUCTION     PELVIC FLOOR REPAIR     SHOULDER ARTHROSCOPY WITH DEBRIDEMENT AND BICEP TENDON REPAIR Left 09/24/2019   Procedure: Left shoulder arthroscopy with  debridement, decompression, rotator cuff repair, and possible biceps tenodesis.;  Surgeon: Corky Mull, MD;  Location: [facility];  Service: Orthopedics;  Laterality: Left;   TOE SURGERY Right    TONSILLECTOMY     VARICOSE VEIN SURGERY      Family History: Reviewed and not relevant to chief complaint  Social History:  reports that she quit smoking about 54 years ago. She has never used smokeless tobacco. She reports current alcohol use of about 4.0 standard drinks of alcohol per week. She reports that she does not use drugs.  Current Medications:  Prior to Admission medications   Medication Sig Start Date End Date Taking? Authorizing Provider  Cholecalciferol (VITAMIN D3) 2000 units capsule Take 2,000 Units by mouth daily.    [provider]  estradiol (ESTRACE) 0.1 MG/GM vaginal cream Place 1 Applicatorful vaginally 2 (two) times a week.     [provider]  fluticasone (FLONASE) 50 MCG/ACT nasal spray Place 2 sprays into both nostrils daily as needed for allergies or rhinitis.    [provider]  Glucosamine-Chondroitin (COSAMIN DS PO) Take 1 tablet by mouth daily.    [provider]  levothyroxine (SYNTHROID, LEVOTHROID) 100 MCG tablet Take 100 mcg by mouth daily before breakfast.    [provider]  loratadine (CLARITIN) 10 MG tablet Take 10 mg by mouth daily as needed for allergies.    [provider]  metoprolol succinate (TOPROL-XL) 100 MG 24 hr tablet Take 100 mg by mouth daily.  12/30/15   [provider]  metroNIDAZOLE (METROCREAM) 0.75 % cream Apply 1 application topically 2 (two) times daily as needed (rosacea.).  [provider]  oxyCODONE (ROXICODONE) 5 MG immediate release tablet Take 1-2 tablets (5-10 mg total) by mouth every 4 (four) hours as needed. Patient not taking: Reported on 08/20/2020 09/24/19   Poggi, Marshall Cork, MD  Probiotic Product (PROBIOTIC PO) Take 1 capsule by mouth daily after lunch.     [provider]  sertraline (ZOLOFT) 50 MG tablet Take 50 mg by mouth daily.     [provider]  vitamin B-12 (CYANOCOBALAMIN) 1000 MCG tablet Take 1,000 mcg by mouth daily.    [provider]    Allergies:  Allergies as of 05/08/2022 - Reviewed 05/08/2022  Allergen Reaction Noted   Clindamycin/lincomycin Hives 01/11/2017   Nitrofurantoin Hives 01/03/2013   Amlodipine Other (See Comments) 07/03/2017   Amoxicillin Itching 07/09/2018   Duloxetine Other (See Comments) 01/03/2013   Nsaids Other (See Comments) 01/03/2013   Oxybutynin Hives, Rash, and Other (See Comments) 01/03/2013   Sulfa antibiotics Other (See Comments) and Diarrhea 01/03/2013   Tegaserod Other (See Comments) 07/23/2013   Ultram [tramadol] Itching 03/02/2017   Vesicare [solifenacin succinate] Other (See Comments) 11/02/2016    ROS:  General: Denies weight loss, weight gain, fatigue, fevers, chills, and night sweats. Eyes: Denies blurry vision, double vision, eye pain, itchy eyes, and tearing. Ears: Denies hearing loss, earache, and ringing in ears. Nose: Denies sinus pain, congestion, infections, runny nose, and nosebleeds. Mouth/throat: Denies hoarseness, sore throat, bleeding gums, and difficulty swallowing. Heart: Denies chest pain, palpitations, racing heart, irregular heartbeat, leg pain or swelling, and decreased activity tolerance. Respiratory: Denies breathing difficulty, shortness of breath, wheezing, cough, and sputum. GI: Denies change in appetite, heartburn, nausea, vomiting, diarrhea, and blood in stool. GU: Denies difficulty urinating, pain with urinating, urgency, frequency, blood in urine. Musculoskeletal: Denies joint stiffness, pain, swelling, muscle weakness. Skin: Denies rash, itching, mass, tumors, sores, and boils Neurologic: Denies headache, fainting, dizziness, seizures, numbness, and tingling. Psychiatric: Denies depression, anxiety, difficulty sleeping, and memory  loss. Endocrine: Denies heat or cold intolerance, and increased thirst or urination. Blood/lymph: Denies easy bruising, easy bruising, and swollen glands     Objective:     BP 117/84   Pulse 77   Temp 99.1 F (37.3 C) (Oral)   Resp 19   Ht 5' 1.5" (1.562 m)   Wt 83.9 kg   SpO2 95%   BMI 34.39 kg/m   Constitutional :  alert, cooperative, appears stated age, and no distress  Lymphatics/Throat:  no asymmetry, masses, or scars  Respiratory:  clear to auscultation bilaterally  Cardiovascular:  regular rate and rhythm  Gastrointestinal: Soft, no guarding, focal tenderness to palpation in suprapubic region .   Musculoskeletal: Steady movement  Skin: Cool and moist   Psychiatric: Normal affect, non-agitated, not confused       LABS:     Latest Ref Rng & Units 05/08/2022   11:12 AM 03/17/2017    5:11 AM 03/16/2017    3:47 AM  CMP  Glucose 70 - 99 mg/dL 129  100  150   BUN 8 - 23 mg/dL '17  17  15   '$ Creatinine 0.44 - 1.00 mg/dL 0.92  0.86  0.68   Sodium 135 - 145 mmol/L 135  135  137   Potassium 3.5 - 5.1 mmol/L 3.7  3.4  3.9   Chloride 98 - 111 mmol/L 104  104  105   CO2 22 - 32 mmol/L '24  24  25   '$ Calcium 8.9 - 10.3 mg/dL 8.8  8.4  8.6   Total Protein 6.5 - 8.1 g/dL 7.5     Total Bilirubin 0.3 - 1.2 mg/dL 1.3     Alkaline Phos 38 - 126 U/L 67     AST 15 - 41 U/L 21     ALT 0 - 44 U/L 12         Latest Ref Rng & Units 05/08/2022   11:12 AM 03/17/2017    5:11 AM 03/16/2017    3:47 AM  CBC  WBC 4.0 - 10.5 K/uL 14.5  7.5  7.5   Hemoglobin 12.0 - 15.0 g/dL 13.5  11.0  12.6   Hematocrit 36.0 - 46.0 % 40.6  32.6  36.7   Platelets 150 - 400 K/uL 261  231  250     RADS: Narrative & Impression  CLINICAL DATA:  Left lower quadrant abdominal pain   EXAM: CT ABDOMEN AND PELVIS WITH CONTRAST   TECHNIQUE: Multidetector CT imaging of the abdomen and pelvis was performed using the standard protocol following bolus administration of intravenous contrast.   RADIATION DOSE  REDUCTION: This exam was performed according to the departmental dose-optimization program which includes automated exposure control, adjustment of the mA and/or kV according to patient size and/or use of iterative reconstruction technique.   CONTRAST:  19m OMNIPAQUE IOHEXOL 300 MG/ML  SOLN   COMPARISON:  08/07/2019   FINDINGS: Lower chest: Bibasilar atelectasis.  Small hiatal hernia.   Hepatobiliary: No focal liver abnormality is seen. No gallstones, gallbladder wall thickening, or biliary dilatation.   Pancreas: Unremarkable. No pancreatic ductal dilatation or surrounding inflammatory changes.   Spleen: Normal in size without focal abnormality.   Adrenals/Urinary Tract: Unremarkable adrenal glands. Kidneys enhance symmetrically without focal lesion, stone, or hydronephrosis. Ureters are nondilated. Diffuse urinary bladder wall thickening which may be reactive secondary to inflammatory process in the pelvis.   Stomach/Bowel: Acute diverticulitis of the mid sigmoid colon which is severely thickened with fat stranding and pericolonic abscess formation. The rim enhancing pericolonic fluid collection within the pelvis measures 4.5 x 2.4 x 2.7 cm. Diverticulosis throughout the colon. No additional sites of bowel inflammation. No dilated loops of bowel to suggest obstruction. Small hiatal hernia. Stomach is otherwise within normal limits.   Vascular/Lymphatic: Aortic atherosclerosis. No enlarged abdominal or pelvic lymph nodes.   Reproductive: Status post hysterectomy. No adnexal masses.   Other: No pneumoperitoneum.  No abdominal wall hernia.   Musculoskeletal: No acute or significant osseous findings.   IMPRESSION: 1. Severe acute sigmoid diverticulitis with pericolonic abscess formation measuring up to 4.5 cm. 2. Diffuse urinary bladder wall thickening which may be reactive secondary to inflammatory process in the pelvis. Correlate with urinalysis to exclude  cystitis. 3. Small hiatal hernia. 4. Aortic atherosclerosis (ICD10-I70.0).     Electronically Signed   By: NDavina PokeD.O.   On: 05/08/2022 13:54   Assessment:   Diverticulitis with abscess Hypertension Hypothyroidism  Plan:   IR guided drainage.  IR consult pending.  Cefepime and Flagyl IV in the meantime.  Clear liquids until midnight.  Continue to monitor with serial abdominal exams and daily labs.  Continue home meds for hypertension hypothyroidism   labs/images/medications/previous chart entries reviewed personally and relevant changes/updates noted above.

## 2022-05-08 NOTE — ED Notes (Signed)
ED Provider at bedside. 

## 2022-05-08 NOTE — ED Notes (Signed)
Rn attempted to reach MD Lysle Pearl.

## 2022-05-08 NOTE — ED Triage Notes (Signed)
Patient presents with lower abdominal pain that sometimes radiates to her upper abdomen that began 4 days ago; She has a history of diverticulitis and states that the pain is similar to what she has experienced in the past; Has not had a bowel movement in 2 days but is passing gas; Denies blood in stool

## 2022-05-08 NOTE — ED Notes (Signed)
Called dietary about a tray for this pt. They are going to send the tray around.

## 2022-05-08 NOTE — ED Provider Notes (Signed)
[facility] Provider Note    Event Date/Time   First MD Initiated Contact with Patient 05/08/22 1235     (approximate)   History   Abdominal Pain (Patient presents with lower abdominal pain that sometimes radiates to her upper abdomen that began 4 days ago; She has a history of diverticulitis and states that the pain is similar to what she has experienced in the past; Has not had a bowel movement in 2 days but is passing gas; Denies blood in stool)   HPI  Mary Elliott is a 81 y.o. female who presents today for evaluation of lower abdominal pain for the past 4 days.  Patient reports that she has a history of diverticulitis and this feels similar.  She has not had any vomiting but reports that she feels nauseated.  She had a couple of loose stools without blood.  She has not had any urinary problems.  Patient Active Problem List   Diagnosis Date Noted   Diverticulitis large intestine 05/08/2022   S/P total knee arthroplasty 11/14/2016          Physical Exam   Triage Vital Signs: ED Triage Vitals  Enc Vitals Group     BP 05/08/22 1107 117/84     Pulse Rate 05/08/22 1107 77     Resp 05/08/22 1107 19     Temp 05/08/22 1110 99.9 F (37.7 C)     Temp Source 05/08/22 1110 Oral     SpO2 05/08/22 1107 95 %     Weight 05/08/22 1108 185 lb (83.9 kg)     Height 05/08/22 1108 5' 1.5" (1.562 m)     Head Circumference --      Peak Flow --      Pain Score 05/08/22 1108 4     Pain Loc --      Pain Edu? --      Excl. in [place]? --     Most recent vital signs: Vitals:   05/08/22 1110 05/08/22 1434  BP:  133/70  Pulse:  86  Resp:  18  Temp: 99.9 F (37.7 C) 99.1 F (37.3 C)  SpO2:  95%    Physical Exam Vitals and nursing note reviewed.  Constitutional:      General: Awake and alert. No acute distress.    Appearance: Normal appearance. The patient is overweight.  HENT:     Head: Normocephalic and atraumatic.     Mouth: Mucous membranes are  moist.  Eyes:     General: PERRL. Normal EOMs        Right eye: No discharge.        Left eye: No discharge.     Conjunctiva/sclera: Conjunctivae normal.  Cardiovascular:     Rate and Rhythm: Normal rate and regular rhythm.     Pulses: Normal pulses.     Heart sounds: Normal heart sounds Pulmonary:     Effort: Pulmonary effort is normal. No respiratory distress.     Breath sounds: Normal breath sounds.  Abdominal:     Abdomen is soft. There is right lower quadrant, suprapubic, and left lower quadrant abdominal tenderness. No rebound or guarding. No distention. Musculoskeletal:        General: No swelling. Normal range of motion.     Cervical back: Normal range of motion and neck supple.  Skin:    General: Skin is warm and dry.     Capillary Refill: Capillary refill takes less than 2 seconds.  Findings: No rash.  Neurological:     Mental Status: The patient is awake and alert.      ED Results / Procedures / Treatments   Labs (all labs ordered are listed, but only abnormal results are displayed) Labs Reviewed  CBC WITH DIFFERENTIAL/PLATELET - Abnormal; Notable for the following components:      Result Value   WBC 14.5 (*)    Neutro Abs 13.0 (*)    Abs Immature Granulocytes 0.08 (*)    All other components within normal limits  COMPREHENSIVE METABOLIC PANEL - Abnormal; Notable for the following components:   Glucose, Bld 129 (*)    Calcium 8.8 (*)    Total Bilirubin 1.3 (*)    All other components within normal limits  URINALYSIS, ROUTINE W REFLEX MICROSCOPIC - Abnormal; Notable for the following components:   Color, Urine YELLOW (*)    APPearance CLEAR (*)    Ketones, ur 5 (*)    Protein, ur 30 (*)    Leukocytes,Ua TRACE (*)    All other components within normal limits  LIPASE, BLOOD     EKG     RADIOLOGY I independently reviewed and interpreted imaging and agree with radiologists findings.     PROCEDURES:  Critical Care performed:    Procedures   MEDICATIONS ORDERED IN ED: Medications  ciprofloxacin (CIPRO) IVPB 400 mg (400 mg Intravenous New Bag/Given 05/08/22 1437)  metroNIDAZOLE (FLAGYL) IVPB 500 mg (500 mg Intravenous New Bag/Given 05/08/22 1438)  iohexol (OMNIPAQUE) 300 MG/ML solution 100 mL (100 mLs Intravenous Contrast Given 05/08/22 1330)     IMPRESSION / MDM / ASSESSMENT AND PLAN / ED COURSE  I reviewed the triage vital signs and the nursing notes.   Differential diagnosis includes, but is not limited to, diverticulitis, colitis, obstruction, partial obstruction, appendicitis, abscess.  Patient is awake and alert, hemodynamically stable and afebrile.  She has diffuse abdominal tenderness though no rebound or guarding.  Labs obtained in triage demonstrate a leukocytosis to 14.  Patient agreed to CT scan which demonstrates diverticulitis with 4.5 cm abscess.  I consulted Dr. Lysle Pearl who has agreed to admit the patient.  Patient is started on Cipro and Flagyl given that she has an allergy to penicillins.  We discussed these findings and recommendations, and patient agrees to be admitted.  Dr. Lysle Pearl has admitted primarily.   Patient's presentation is most consistent with acute presentation with potential threat to life or bodily function.   Clinical Course as of 05/08/22 1447  Sun May 08, 2022  1407 Discussed with Dr. Lysle Pearl [JP]    Clinical Course User Index [JP] Jaanai Salemi, Clarnce Flock, PA-C     FINAL CLINICAL IMPRESSION(S) / ED DIAGNOSES   Final diagnoses:  Diverticulitis of large intestine with abscess, unspecified bleeding status     Rx / DC Orders   ED Discharge Orders     None        Note:  This document was prepared using Dragon voice recognition software and may include unintentional dictation errors.   Marquette Old, PA-C 05/08/22 1447    Nathaniel Man, MD 05/08/22 1806

## 2022-05-08 NOTE — Consult Note (Signed)
IR Procedure request - Intra abdominal abscess drain placement  81 y.o. female inpatient ( in the ED). History of HTN, hypothyroidism, GERD, basel cell carcinoma, amenia. Diverticulitis. Presented to the ED at [facility] with lower abdominal pain X 4 days. Found to have an intra- abdominal abscess. CT abd pelvis from 12.31.23 reads Severe acute sigmoid diverticulitis with pericolonic abscess formation measuring up to 4.5 cm. Team is requesting an intra abdominal abscess drain placement.  WBC 14.4. Last recorded temperature 99.1. See list of allergies for pertinent allergies.   Case reviewed by IR Attending Dr. Vernia Buff. After review of pertinent imaging. The area in question is small and difficult to access percutanously. Recommend patient be medical management. If condition has not improved rescan in 2-3 days  time. This was communicated to the Team directly by IR Attending Dr. Vernia Buff.

## 2022-05-09 ENCOUNTER — Encounter: Payer: Self-pay | Admitting: Surgery

## 2022-05-09 DIAGNOSIS — K5720 Diverticulitis of large intestine with perforation and abscess without bleeding: Secondary | ICD-10-CM

## 2022-05-09 LAB — COMPREHENSIVE METABOLIC PANEL
ALT: 9 U/L (ref 0–44)
AST: 16 U/L (ref 15–41)
Albumin: 3.5 g/dL (ref 3.5–5.0)
Alkaline Phosphatase: 63 U/L (ref 38–126)
Anion gap: 9 (ref 5–15)
BUN: 15 mg/dL (ref 8–23)
CO2: 18 mmol/L — ABNORMAL LOW (ref 22–32)
Calcium: 8.4 mg/dL — ABNORMAL LOW (ref 8.9–10.3)
Chloride: 108 mmol/L (ref 98–111)
Creatinine, Ser: 0.86 mg/dL (ref 0.44–1.00)
GFR, Estimated: >60 mL/min (ref >60)
Glucose, Bld: 108 mg/dL — ABNORMAL HIGH (ref 70–99)
Potassium: 3 mmol/L — ABNORMAL LOW (ref 3.5–5.1)
Sodium: 135 mmol/L (ref 135–145)
Total Bilirubin: 1.2 mg/dL (ref 0.3–1.2)
Total Protein: 6.5 g/dL (ref 6.5–8.1)

## 2022-05-09 LAB — CBC
HCT: 37.9 % (ref 36.0–46.0)
Hemoglobin: 12.6 g/dL (ref 12.0–15.0)
MCH: 30.6 pg (ref 26.0–34.0)
MCHC: 33.2 g/dL (ref 30.0–36.0)
MCV: 92 fL (ref 80.0–100.0)
Platelets: 247 10*3/uL (ref 150–400)
RBC: 4.12 MIL/uL (ref 3.87–5.11)
RDW: 14.4 % (ref 11.5–15.5)
WBC: 14.5 10*3/uL — ABNORMAL HIGH (ref 4.0–10.5)
nRBC: 0 % (ref 0.0–0.2)

## 2022-05-09 LAB — PROTIME-INR
INR: 1.3 — ABNORMAL HIGH (ref 0.8–1.2)
Prothrombin Time: 16.5 seconds — ABNORMAL HIGH (ref 11.4–15.2)

## 2022-05-09 LAB — LACTIC ACID, PLASMA: Lactic Acid, Venous: 1.1 mmol/L (ref 0.5–1.9)

## 2022-05-09 MED ORDER — POTASSIUM CHLORIDE IN NACL 20-0.9 MEQ/L-% IV SOLN
INTRAVENOUS | Status: DC
  Filled 2022-05-09 (×6): qty 1000

## 2022-05-09 MED ORDER — POTASSIUM CHLORIDE CRYS ER 20 MEQ PO TBCR
40.0000 meq | EXTENDED_RELEASE_TABLET | Freq: Once | ORAL | Status: AC
  Administered 2022-05-09: 40 meq via ORAL
  Filled 2022-05-09: qty 2

## 2022-05-09 NOTE — Progress Notes (Signed)
05/09/2022  Subjective: No acute events overnight.  Patient admitted last night with diverticulitis with abscess in the pelvis.  Patient reports that this morning her pain is better controlled.  Denies any worsening abdominal pain, nausea.  Her white blood cell count is stable at 14.5.  Vital signs: Temp:  [98.5 F (36.9 C)-99.6 F (37.6 C)] 98.5 F (36.9 C) (01/01 0950) Pulse Rate:  [57-86] 68 (01/01 0950) Resp:  [18-20] 18 (01/01 0950) BP: (71-133)/(53-80) 123/73 (01/01 0950) SpO2:  [90 %-97 %] 97 % (01/01 0950)   Intake/Output: 12/31 0701 - 01/01 0700 In: 1000 [I.V.:1000] Out: -  Last BM Date : 05/08/22  Physical Exam: Constitutional: No acute distress Abdomen: Soft, nondistended, with tenderness to palpation in the low pelvis.  No peritonitis.  Labs:  Recent Labs    05/08/22 2046 05/09/22 0646  WBC 14.4* 14.5*  HGB 11.7* 12.6  HCT 35.4* 37.9  PLT 224 247   Recent Labs    05/08/22 1112 05/09/22 0646  NA 135 135  K 3.7 3.0*  CL 104 108  CO2 24 18*  GLUCOSE 129* 108*  BUN 17 15  CREATININE 0.92 0.86  CALCIUM 8.8* 8.4*   Recent Labs    05/09/22 0646  LABPROT 16.5*  INR 1.3*    Imaging: CT ABDOMEN PELVIS W CONTRAST  Result Date: 05/08/2022 CLINICAL DATA:  Left lower quadrant abdominal pain EXAM: CT ABDOMEN AND PELVIS WITH CONTRAST TECHNIQUE: Multidetector CT imaging of the abdomen and pelvis was performed using the standard protocol following bolus administration of intravenous contrast. RADIATION DOSE REDUCTION: This exam was performed according to the departmental dose-optimization program which includes automated exposure control, adjustment of the mA and/or kV according to patient size and/or use of iterative reconstruction technique. CONTRAST:  182m OMNIPAQUE IOHEXOL 300 MG/ML  SOLN COMPARISON:  08/07/2019 FINDINGS: Lower chest: Bibasilar atelectasis.  Small hiatal hernia. Hepatobiliary: No focal liver abnormality is seen. No gallstones, gallbladder wall  thickening, or biliary dilatation. Pancreas: Unremarkable. No pancreatic ductal dilatation or surrounding inflammatory changes. Spleen: Normal in size without focal abnormality. Adrenals/Urinary Tract: Unremarkable adrenal glands. Kidneys enhance symmetrically without focal lesion, stone, or hydronephrosis. Ureters are nondilated. Diffuse urinary bladder wall thickening which may be reactive secondary to inflammatory process in the pelvis. Stomach/Bowel: Acute diverticulitis of the mid sigmoid colon which is severely thickened with fat stranding and pericolonic abscess formation. The rim enhancing pericolonic fluid collection within the pelvis measures 4.5 x 2.4 x 2.7 cm. Diverticulosis throughout the colon. No additional sites of bowel inflammation. No dilated loops of bowel to suggest obstruction. Small hiatal hernia. Stomach is otherwise within normal limits. Vascular/Lymphatic: Aortic atherosclerosis. No enlarged abdominal or pelvic lymph nodes. Reproductive: Status post hysterectomy. No adnexal masses. Other: No pneumoperitoneum.  No abdominal wall hernia. Musculoskeletal: No acute or significant osseous findings. IMPRESSION: 1. Severe acute sigmoid diverticulitis with pericolonic abscess formation measuring up to 4.5 cm. 2. Diffuse urinary bladder wall thickening which may be reactive secondary to inflammatory process in the pelvis. Correlate with urinalysis to exclude cystitis. 3. Small hiatal hernia. 4. Aortic atherosclerosis (ICD10-I70.0). Electronically Signed   By: NDavina PokeD.O.   On: 05/08/2022 13:54    Assessment/Plan: This is a 82y.o. female with peripheral diverticulitis with abscess.  - Discussed with the patient again the findings on her CT scan.  Currently location of the abscess is not abnormal for drainage and the size may be a little too small.  Will continue conservative management for now with IV antibiotics,  current diet with ice chips and sips, with IV fluid hydration and  appropriate pain control. - Discussed with her the plan to repeat CT scan in 2 to 3 days in order to reassess the abscess.  Discussed with her the attempt to treat this conservatively with IV antibiotics and +/- drain.  Discussed with her however that if there is any worsening, she may require surgery during this admission.     I spent 35 minutes dedicated to the care of this patient on the date of this encounter to include pre-visit review of records, face-to-face time with the patient discussing diagnosis and management, and any post-visit coordination of care.  Melvyn Neth, [facility]

## 2022-05-10 LAB — CBC
HCT: 36.8 % (ref 36.0–46.0)
Hemoglobin: 12 g/dL (ref 12.0–15.0)
MCH: 30.5 pg (ref 26.0–34.0)
MCHC: 32.6 g/dL (ref 30.0–36.0)
MCV: 93.4 fL (ref 80.0–100.0)
Platelets: 280 10*3/uL (ref 150–400)
RBC: 3.94 MIL/uL (ref 3.87–5.11)
RDW: 14.5 % (ref 11.5–15.5)
WBC: 10.1 10*3/uL (ref 4.0–10.5)
nRBC: 0 % (ref 0.0–0.2)

## 2022-05-10 LAB — BASIC METABOLIC PANEL
Anion gap: 9 (ref 5–15)
BUN: 15 mg/dL (ref 8–23)
CO2: 17 mmol/L — ABNORMAL LOW (ref 22–32)
Calcium: 8.1 mg/dL — ABNORMAL LOW (ref 8.9–10.3)
Chloride: 112 mmol/L — ABNORMAL HIGH (ref 98–111)
Creatinine, Ser: 0.8 mg/dL (ref 0.44–1.00)
GFR, Estimated: >60 mL/min (ref >60)
Glucose, Bld: 84 mg/dL (ref 70–99)
Potassium: 3.9 mmol/L (ref 3.5–5.1)
Sodium: 138 mmol/L (ref 135–145)

## 2022-05-10 LAB — MAGNESIUM: Magnesium: 2.1 mg/dL (ref 1.7–2.4)

## 2022-05-10 MED ORDER — ORAL CARE MOUTH RINSE: 15.0000 mL | OROMUCOSAL | Status: DC | PRN

## 2022-05-10 NOTE — Progress Notes (Signed)
Subjective:  CC: Mary Elliott is a 82 y.o. female  Hospital stay day 2,   perforated diverticulitis  HPI: No acute issues overnight.  Pain improving in pelvic area.  ROS:  General: Denies weight loss, weight gain, fatigue, fevers, chills, and night sweats. Heart: Denies chest pain, palpitations, racing heart, irregular heartbeat, leg pain or swelling, and decreased activity tolerance. Respiratory: Denies breathing difficulty, shortness of breath, wheezing, cough, and sputum. GI: Denies change in appetite, heartburn, nausea, vomiting, constipation, diarrhea, and blood in stool. GU: Denies difficulty urinating, pain with urinating, urgency, frequency, blood in urine.   Objective:   Temp:  [98.2 F (36.8 C)-98.7 F (37.1 C)] 98.4 F (36.9 C) (01/02 0724) Pulse Rate:  [55-74] 59 (01/02 0724) Resp:  [14-18] 18 (01/02 0724) BP: (104-136)/(63-81) 104/63 (01/02 0724) SpO2:  [95 %-100 %] 98 % (01/02 0724)     Height: 5' 1.5" (156.2 cm) Weight: 83.9 kg BMI (Calculated): 34.39   Intake/Output this shift:   Intake/Output Summary (Last 24 hours) at 05/10/2022 1343 Last data filed at 05/10/2022 0900 Gross per 24 hour  Intake 679.11 ml  Output 1950 ml  Net -1270.89 ml    Constitutional :  alert, cooperative, appears stated age, and no distress  Respiratory:  clear to auscultation bilaterally  Cardiovascular:  regular rate and rhythm  Gastrointestinal: Soft, no guarding, tenderness to palpation in epigastric region now.  Suprapubic left lower quadrant pain improved .   Skin: Cool and moist.   Psychiatric: Normal affect, non-agitated, not confused       LABS:     Latest Ref Rng & Units 05/10/2022    5:37 AM 05/09/2022    6:46 AM 05/08/2022   11:12 AM  CMP  Glucose 70 - 99 mg/dL 84  108  129   BUN 8 - 23 mg/dL '15  15  17   '$ Creatinine 0.44 - 1.00 mg/dL 0.80  0.86  0.92   Sodium 135 - 145 mmol/L 138  135  135   Potassium 3.5 - 5.1 mmol/L 3.9  3.0  3.7   Chloride 98 - 111 mmol/L 112   108  104   CO2 22 - 32 mmol/L '17  18  24   '$ Calcium 8.9 - 10.3 mg/dL 8.1  8.4  8.8   Total Protein 6.5 - 8.1 g/dL  6.5  7.5   Total Bilirubin 0.3 - 1.2 mg/dL  1.2  1.3   Alkaline Phos 38 - 126 U/L  63  67   AST 15 - 41 U/L  16  21   ALT 0 - 44 U/L  9  12       Latest Ref Rng & Units 05/10/2022    5:37 AM 05/09/2022    6:46 AM 05/08/2022    8:46 PM  CBC  WBC 4.0 - 10.5 K/uL 10.1  14.5  14.4   Hemoglobin 12.0 - 15.0 g/dL 12.0  12.6  11.7   Hematocrit 36.0 - 46.0 % 36.8  37.9  35.4   Platelets 150 - 400 K/uL 280  247  224     RADS: N/a Assessment:   Perforated diverticulitis.  Fluid collection around perforation not amenable to IR drainage at this time.  Continuing IV antibiotics and will reassess with repeat CT scan in the next day or so.  Patient otherwise improving clinically with decreasing pain and leukocytosis resolving.  Will advance to clear liquid diet and continue to monitor today.  labs/images/medications/previous chart entries reviewed personally and relevant  changes/updates noted above.

## 2022-05-10 NOTE — TOC CM/SW Note (Signed)
  Transition of Care [facility]) Screening Note   Patient Details  Name: Mary Elliott Date of Birth: Mar 24, 1941   Transition of Care [facility]) CM/SW Contact:    Candie Chroman, LCSW Phone Number: 05/10/2022, 8:18 AM    Transition of Care Department [facility]) has reviewed patient and no TOC needs have been identified at this time. We will continue to monitor patient advancement through interdisciplinary progression rounds. If new patient transition needs arise, please place a TOC consult.

## 2022-05-11 MED ORDER — SODIUM CHLORIDE 0.9 % IV SOLN: INTRAVENOUS | Status: DC

## 2022-05-11 MED ORDER — SODIUM CHLORIDE 0.9% FLUSH: 10.0000 mL | INTRAVENOUS | Status: DC | PRN

## 2022-05-11 NOTE — Plan of Care (Signed)
  Problem: Education: Goal: Knowledge of General Education information will improve Description Including pain rating scale, medication(s)/side effects and non-pharmacologic comfort measures Outcome: Progressing   

## 2022-05-11 NOTE — Progress Notes (Signed)
Subjective:  CC: Mary Elliott is a 82 y.o. female  Hospital stay day 3,   perforated diverticulitis  HPI: No acute issues overnight.  Pain continues to improve  ROS:  General: Denies weight loss, weight gain, fatigue, fevers, chills, and night sweats. Heart: Denies chest pain, palpitations, racing heart, irregular heartbeat, leg pain or swelling, and decreased activity tolerance. Respiratory: Denies breathing difficulty, shortness of breath, wheezing, cough, and sputum. GI: Denies change in appetite, heartburn, nausea, vomiting, constipation, diarrhea, and blood in stool. GU: Denies difficulty urinating, pain with urinating, urgency, frequency, blood in urine.   Objective:   Temp:  [97.7 F (36.5 C)-98.5 F (36.9 C)] 97.7 F (36.5 C) (01/03 0736) Pulse Rate:  [60-94] 70 (01/03 0736) Resp:  [15-20] 20 (01/03 0736) BP: (109-150)/(69-102) 131/102 (01/03 0736) SpO2:  [97 %-99 %] 98 % (01/03 0736)     Height: 5' 1.5" (156.2 cm) Weight: 83.9 kg BMI (Calculated): 34.39   Intake/Output this shift:   Intake/Output Summary (Last 24 hours) at 05/11/2022 0944 Last data filed at 05/11/2022 5176 Gross per 24 hour  Intake 0 ml  Output 1300 ml  Net -1300 ml    Constitutional :  alert, cooperative, appears stated age, and no distress  Respiratory:  clear to auscultation bilaterally  Cardiovascular:  regular rate and rhythm  Gastrointestinal: Soft, no guarding, improving tenderness to palpation in epigastric region now.  Suprapubic left lower quadrant pain improving .   Skin: Cool and moist.   Psychiatric: Normal affect, non-agitated, not confused       LABS:     Latest Ref Rng & Units 05/10/2022    5:37 AM 05/09/2022    6:46 AM 05/08/2022   11:12 AM  CMP  Glucose 70 - 99 mg/dL 84  108  129   BUN 8 - 23 mg/dL '15  15  17   '$ Creatinine 0.44 - 1.00 mg/dL 0.80  0.86  0.92   Sodium 135 - 145 mmol/L 138  135  135   Potassium 3.5 - 5.1 mmol/L 3.9  3.0  3.7   Chloride 98 - 111 mmol/L 112   108  104   CO2 22 - 32 mmol/L '17  18  24   '$ Calcium 8.9 - 10.3 mg/dL 8.1  8.4  8.8   Total Protein 6.5 - 8.1 g/dL  6.5  7.5   Total Bilirubin 0.3 - 1.2 mg/dL  1.2  1.3   Alkaline Phos 38 - 126 U/L  63  67   AST 15 - 41 U/L  16  21   ALT 0 - 44 U/L  9  12       Latest Ref Rng & Units 05/10/2022    5:37 AM 05/09/2022    6:46 AM 05/08/2022    8:46 PM  CBC  WBC 4.0 - 10.5 K/uL 10.1  14.5  14.4   Hemoglobin 12.0 - 15.0 g/dL 12.0  12.6  11.7   Hematocrit 36.0 - 46.0 % 36.8  37.9  35.4   Platelets 150 - 400 K/uL 280  247  224     RADS: N/a Assessment:   Perforated diverticulitis.  Fluid collection around perforation not amenable to IR drainage at this time.  Continuing IV antibiotics and will reassess with repeat CT scan tomorrow.  Patient otherwise improving clinically with decreasing pain.  Will advance to full liquid diet and continue to monitor today.  labs/images/medications/previous chart entries reviewed personally and relevant changes/updates noted above.

## 2022-05-12 ENCOUNTER — Inpatient Hospital Stay: Payer: PPO

## 2022-05-12 LAB — PROTIME-INR
INR: 1.2 (ref 0.8–1.2)
Prothrombin Time: 15.1 seconds (ref 11.4–15.2)

## 2022-05-12 MED ORDER — IOHEXOL 300 MG/ML  SOLN
100.0000 mL | Freq: Once | INTRAMUSCULAR | Status: AC | PRN
  Administered 2022-05-12: 100 mL via INTRAVENOUS

## 2022-05-12 MED ORDER — IOHEXOL 9 MG/ML PO SOLN
500.0000 mL | ORAL | Status: AC
  Administered 2022-05-12 (×2): 500 mL via ORAL

## 2022-05-12 NOTE — Progress Notes (Signed)

## 2022-05-12 NOTE — Progress Notes (Signed)
Subjective:  CC: Mary Elliott is a 82 y.o. female  Hospital stay day 4,   perforated diverticulitis  HPI: No acute issues overnight.  No complaints of pain but feels fatigued  ROS:  General: Denies weight loss, weight gain, fatigue, fevers, chills, and night sweats. Heart: Denies chest pain, palpitations, racing heart, irregular heartbeat, leg pain or swelling, and decreased activity tolerance. Respiratory: Denies breathing difficulty, shortness of breath, wheezing, cough, and sputum. GI: Denies change in appetite, heartburn, nausea, vomiting, constipation, diarrhea, and blood in stool. GU: Denies difficulty urinating, pain with urinating, urgency, frequency, blood in urine.   Objective:   Temp:  [98.2 F (36.8 C)-98.4 F (36.9 C)] 98.2 F (36.8 C) (01/04 0749) Pulse Rate:  [52-64] 57 (01/04 0749) Resp:  [16-20] 18 (01/04 0749) BP: (133-155)/(78-91) 155/78 (01/04 0749) SpO2:  [95 %-100 %] 97 % (01/04 0749)     Height: 5' 1.5" (156.2 cm) Weight: 83.9 kg BMI (Calculated): 34.39   Intake/Output this shift:   Intake/Output Summary (Last 24 hours) at 05/12/2022 1500 Last data filed at 05/12/2022 1332 Gross per 24 hour  Intake --  Output 1250 ml  Net -1250 ml    Constitutional :  alert, cooperative, appears stated age, and no distress  Respiratory:  clear to auscultation bilaterally  Cardiovascular:  regular rate and rhythm  Gastrointestinal: Soft, no guarding, no tenderness to palpation .   Skin: Cool and moist.   Psychiatric: Normal affect, non-agitated, not confused       LABS:     Latest Ref Rng & Units 05/10/2022    5:37 AM 05/09/2022    6:46 AM 05/08/2022   11:12 AM  CMP  Glucose 70 - 99 mg/dL 84  108  129   BUN 8 - 23 mg/dL '15  15  17   '$ Creatinine 0.44 - 1.00 mg/dL 0.80  0.86  0.92   Sodium 135 - 145 mmol/L 138  135  135   Potassium 3.5 - 5.1 mmol/L 3.9  3.0  3.7   Chloride 98 - 111 mmol/L 112  108  104   CO2 22 - 32 mmol/L '17  18  24   '$ Calcium 8.9 - 10.3 mg/dL  8.1  8.4  8.8   Total Protein 6.5 - 8.1 g/dL  6.5  7.5   Total Bilirubin 0.3 - 1.2 mg/dL  1.2  1.3   Alkaline Phos 38 - 126 U/L  63  67   AST 15 - 41 U/L  16  21   ALT 0 - 44 U/L  9  12       Latest Ref Rng & Units 05/10/2022    5:37 AM 05/09/2022    6:46 AM 05/08/2022    8:46 PM  CBC  WBC 4.0 - 10.5 K/uL 10.1  14.5  14.4   Hemoglobin 12.0 - 15.0 g/dL 12.0  12.6  11.7   Hematocrit 36.0 - 46.0 % 36.8  37.9  35.4   Platelets 150 - 400 K/uL 280  247  224     RADS: Narrative & Impression  CLINICAL DATA:  Perforated diverticulitis   EXAM: CT ABDOMEN AND PELVIS WITH CONTRAST   TECHNIQUE: Multidetector CT imaging of the abdomen and pelvis was performed using the standard protocol following bolus administration of intravenous contrast.   RADIATION DOSE REDUCTION: This exam was performed according to the departmental dose-optimization program which includes automated exposure control, adjustment of the mA and/or kV according to patient size and/or use of iterative reconstruction  technique.   CONTRAST:  179m OMNIPAQUE IOHEXOL 300 MG/ML  SOLN   COMPARISON:  CT abdomen and pelvis dated 05/08/2022, 08/07/2019   FINDINGS: Lower chest: No focal consolidation or pulmonary nodule in the lung bases. New trace bilateral pleural effusions. No pneumothorax. Partially imaged heart size is normal.   Hepatobiliary: No focal hepatic lesions. No intra or extrahepatic biliary ductal dilation. Normal gallbladder.   Pancreas: No focal lesions or main ductal dilation.   Spleen: Normal in size without focal abnormality.   Adrenals/Urinary Tract: No adrenal nodules. No suspicious renal mass, calculi or hydronephrosis. No focal bladder wall thickening.   Stomach/Bowel: Small hiatal hernia. Normal appearance of the stomach. Persistent findings of perforated sigmoid diverticulitis with increased gas content of the 4.6 x 2.7 cm pericolonic fluid collection containing hyperattenuating contrast  material (3:63). No substantial change in size when compared to 05/08/2022. There is suggestion of fistula along the superior aspect of the collection from the adjacent sigmoid colon (6:40). Normal appendix.   Vascular/Lymphatic: Aortic atherosclerosis. No enlarged abdominal or pelvic lymph nodes.   Reproductive: Irregular cystic focus in the right ovary measuring 2.1 x 0.9 cm (3:56), new compared to 08/07/2019. Pelvic floor laxity.   Other: Small volume hyperattenuating free fluid. No new fluid collections.   Musculoskeletal: No acute or abnormal lytic or blastic osseous lesions. Unchanged grade 1 anterolisthesis at L4-5. Small fat-containing paraumbilical hernia.   IMPRESSION: 1. Persistent findings of perforated sigmoid diverticulitis with increased gas content of the 4.6 x 2.7 cm pericolonic fluid collection with suggestion of fistula along the superior aspect of the collection from the adjacent sigmoid colon. 2. New trace bilateral pleural effusions. 3. Irregular cystic focus in the right ovary measuring 2.1 cm, new compared to 08/07/2019. Consider further characterization with nonemergent pelvic ultrasound examination. 4.  Aortic Atherosclerosis (ICD10-I70.0).     Electronically Signed   By: LDarrin NipperM.D.   On: 05/12/2022 12:10   Assessment:   Perforated diverticulitis.    Despite becoming symptom-free at this point and tolerating full liquid diet, repeat CT scan showed persistent abscess with possible worsening due to presence of air now.  Discussed case with IR again and area is still not amenable to drainage.  Options moving forward including advancing to regular diet to see if any recurrent symptoms and then monitor as an outpatient if patient remains asymptomatic, versus proceeding with drainage of abscess surgically at this point.  After extensive discussion with patient and family members, patient and family member will like to think about it and resume regular  diet for now.  I explained to them there will be a better chance to avoid an ostomy creation the longer we wait and allow the colon to heal from this initial injury even if the abscess is not directly addressed immediately.   labs/images/medications/previous chart entries reviewed personally and relevant changes/updates noted above.

## 2022-05-12 NOTE — Plan of Care (Signed)

## 2022-05-13 MED ORDER — METRONIDAZOLE 500 MG PO TABS: 500.0000 mg | ORAL_TABLET | Freq: Two times a day (BID) | ORAL | 0 refills | Status: AC

## 2022-05-13 MED ORDER — CIPROFLOXACIN HCL 500 MG PO TABS: 500.0000 mg | ORAL_TABLET | Freq: Two times a day (BID) | ORAL | 0 refills | Status: AC

## 2022-05-13 MED ORDER — PANTOPRAZOLE SODIUM 20 MG PO TBEC: 20.0000 mg | DELAYED_RELEASE_TABLET | Freq: Every day | ORAL | 0 refills | Status: DC

## 2022-05-13 MED ORDER — SUCRALFATE 1 GM/10ML PO SUSP: 1.0000 g | Freq: Four times a day (QID) | ORAL | 0 refills | Status: DC

## 2022-05-13 NOTE — Discharge Instructions (Signed)
Take all medications as prescribed  Call office on Monday, 05/16/22, to schedule followup appointment.  Call office number to reach answering service with any additional questions or concerns over the weekend

## 2022-05-13 NOTE — Progress Notes (Signed)
Subjective:  CC: Mary Elliott is a 82 y.o. female  Hospital stay day 5,   perforated diverticulitis  HPI: Some fullness noted after trying regular diet.  Otherwise no complaints of increasing pain   ROS:  General: Denies weight loss, weight gain, fatigue, fevers, chills, and night sweats. Heart: Denies chest pain, palpitations, racing heart, irregular heartbeat, leg pain or swelling, and decreased activity tolerance. Respiratory: Denies breathing difficulty, shortness of breath, wheezing, cough, and sputum. GI: Denies change in appetite, heartburn, nausea, vomiting, constipation, diarrhea, and blood in stool. GU: Denies difficulty urinating, pain with urinating, urgency, frequency, blood in urine.   Objective:   Temp:  [97.7 F (36.5 C)-98.5 F (36.9 C)] 97.7 F (36.5 C) (01/05 0848) Pulse Rate:  [53-69] 55 (01/05 0848) Resp:  [18-20] 18 (01/05 0848) BP: (116-165)/(84-90) 165/90 (01/05 0848) SpO2:  [96 %-99 %] 98 % (01/05 0848)     Height: 5' 1.5" (156.2 cm) Weight: 83.9 kg BMI (Calculated): 34.39   Intake/Output this shift:   Intake/Output Summary (Last 24 hours) at 05/13/2022 1323 Last data filed at 05/13/2022 1123 Gross per 24 hour  Intake 941.41 ml  Output 900 ml  Net 41.41 ml    Constitutional :  alert, cooperative, appears stated age, and no distress  Respiratory:  clear to auscultation bilaterally  Cardiovascular:  regular rate and rhythm  Gastrointestinal: Soft, no guarding, no tenderness to palpation .   Skin: Cool and moist.   Psychiatric: Normal affect, non-agitated, not confused       LABS:     Latest Ref Rng & Units 05/10/2022    5:37 AM 05/09/2022    6:46 AM 05/08/2022   11:12 AM  CMP  Glucose 70 - 99 mg/dL 84  108  129   BUN 8 - 23 mg/dL '15  15  17   '$ Creatinine 0.44 - 1.00 mg/dL 0.80  0.86  0.92   Sodium 135 - 145 mmol/L 138  135  135   Potassium 3.5 - 5.1 mmol/L 3.9  3.0  3.7   Chloride 98 - 111 mmol/L 112  108  104   CO2 22 - 32 mmol/L '17  18   24   '$ Calcium 8.9 - 10.3 mg/dL 8.1  8.4  8.8   Total Protein 6.5 - 8.1 g/dL  6.5  7.5   Total Bilirubin 0.3 - 1.2 mg/dL  1.2  1.3   Alkaline Phos 38 - 126 U/L  63  67   AST 15 - 41 U/L  16  21   ALT 0 - 44 U/L  9  12       Latest Ref Rng & Units 05/10/2022    5:37 AM 05/09/2022    6:46 AM 05/08/2022    8:46 PM  CBC  WBC 4.0 - 10.5 K/uL 10.1  14.5  14.4   Hemoglobin 12.0 - 15.0 g/dL 12.0  12.6  11.7   Hematocrit 36.0 - 46.0 % 36.8  37.9  35.4   Platelets 150 - 400 K/uL 280  247  224     RADS: Narrative & Impression  CLINICAL DATA:  Perforated diverticulitis   EXAM: CT ABDOMEN AND PELVIS WITH CONTRAST   TECHNIQUE: Multidetector CT imaging of the abdomen and pelvis was performed using the standard protocol following bolus administration of intravenous contrast.   RADIATION DOSE REDUCTION: This exam was performed according to the departmental dose-optimization program which includes automated exposure control, adjustment of the mA and/or kV according to patient size and/or  use of iterative reconstruction technique.   CONTRAST:  14m OMNIPAQUE IOHEXOL 300 MG/ML  SOLN   COMPARISON:  CT abdomen and pelvis dated 05/08/2022, 08/07/2019   FINDINGS: Lower chest: No focal consolidation or pulmonary nodule in the lung bases. New trace bilateral pleural effusions. No pneumothorax. Partially imaged heart size is normal.   Hepatobiliary: No focal hepatic lesions. No intra or extrahepatic biliary ductal dilation. Normal gallbladder.   Pancreas: No focal lesions or main ductal dilation.   Spleen: Normal in size without focal abnormality.   Adrenals/Urinary Tract: No adrenal nodules. No suspicious renal mass, calculi or hydronephrosis. No focal bladder wall thickening.   Stomach/Bowel: Small hiatal hernia. Normal appearance of the stomach. Persistent findings of perforated sigmoid diverticulitis with increased gas content of the 4.6 x 2.7 cm pericolonic fluid collection containing  hyperattenuating contrast material (3:63). No substantial change in size when compared to 05/08/2022. There is suggestion of fistula along the superior aspect of the collection from the adjacent sigmoid colon (6:40). Normal appendix.   Vascular/Lymphatic: Aortic atherosclerosis. No enlarged abdominal or pelvic lymph nodes.   Reproductive: Irregular cystic focus in the right ovary measuring 2.1 x 0.9 cm (3:56), new compared to 08/07/2019. Pelvic floor laxity.   Other: Small volume hyperattenuating free fluid. No new fluid collections.   Musculoskeletal: No acute or abnormal lytic or blastic osseous lesions. Unchanged grade 1 anterolisthesis at L4-5. Small fat-containing paraumbilical hernia.   IMPRESSION: 1. Persistent findings of perforated sigmoid diverticulitis with increased gas content of the 4.6 x 2.7 cm pericolonic fluid collection with suggestion of fistula along the superior aspect of the collection from the adjacent sigmoid colon. 2. New trace bilateral pleural effusions. 3. Irregular cystic focus in the right ovary measuring 2.1 cm, new compared to 08/07/2019. Consider further characterization with nonemergent pelvic ultrasound examination. 4.  Aortic Atherosclerosis (ICD10-I70.0).     Electronically Signed   By: LDarrin NipperM.D.   On: 05/12/2022 12:10   Assessment:   Perforated diverticulitis.    Still mostly symptom-free with minor increase in nausea with regular food.  Patient reports food not tasting correctly, which may be due to the Flagyl.  Will make sure she tolerates lunch before considering discharge at this point.  labs/images/medications/previous chart entries reviewed personally and relevant changes/updates noted above.

## 2022-05-13 NOTE — Plan of Care (Signed)
  Problem: Health Behavior/Discharge Planning: Goal: Ability to manage health-related needs will improve Outcome: Progressing   Problem: Clinical Measurements: Goal: Ability to maintain clinical measurements within normal limits will improve Outcome: Progressing   Problem: Education: Goal: Knowledge of General Education information will improve Description: Including pain rating scale, medication(s)/side effects and non-pharmacologic comfort measures Outcome: Progressing   Problem: Clinical Measurements: Goal: Will remain free from infection Outcome: Progressing

## 2022-05-16 NOTE — Discharge Summary (Signed)
Physician Discharge Summary  Patient ID: Mary Elliott MRN: [id number] DOB/AGE: 02/03/1941 82 y.o.  Admit date: 05/08/2022 Discharge date: 05/13/2022  Admission Diagnoses: Perforated diverticulitis with abscess  Discharge Diagnoses:  Same as above  Discharged Condition: good  Hospital Course: admitted for above.  Treated with IV antibiotics to see if abscess will develop into something amenable to drainage by interventional radiology.  Repeat CT scan did not show any changes.  However, patient's pain improved and she was essentially pain-free at time of discharge as well as tolerating a regular diet, normal leukocytosis.  After extensive discussion about the risk benefits alternatives to proceeding with surgical drainage of the abscess versus continued observation, patient has decided to proceed with close outpatient follow-up.  Please see progress note for further details.  At time of d/c, tolerating diet and pain controlled  Consults:  IR  Discharge Exam: Blood pressure (!) 122/93, pulse (!) 58, temperature 98.1 F (36.7 C), temperature source Oral, resp. rate 18, height 5' 1.5" (1.562 m), weight 83.9 kg, SpO2 98 %. General appearance: alert, cooperative, and no distress GI: soft, non-tender; bowel sounds normal; no masses,  no organomegaly  Disposition:  Discharge disposition: 01-Home or Self Care       Discharge Instructions     Discharge patient   Complete by: As directed    Discharge disposition: 01-Home or Self Care   Discharge patient date: 05/13/2022      Allergies as of 05/13/2022       Reactions   Clindamycin/lincomycin Hives   Nitrofurantoin Hives   Amlodipine Other (See Comments)   edema   Amoxicillin Itching   Duloxetine Other (See Comments)   Unable to empty bladder At a high dose of med   Nsaids Other (See Comments)   Prior HX of ulcers   Oxybutynin Hives, Rash, Other (See Comments)   The patches cause a reaction.   Sulfa Antibiotics Other (See  Comments), Diarrhea   GI UPSET Felt cloudy and couldn't move   Tegaserod Other (See Comments)   Joint pain   Ultram [tramadol] Itching   Vesicare [solifenacin Succinate] Other (See Comments)   headache        Medication List     TAKE these medications    ciprofloxacin 500 MG tablet Commonly known as: CIPRO Take 1 tablet (500 mg total) by mouth 2 (two) times daily for 5 days.   cyanocobalamin 1000 MCG tablet Commonly known as: VITAMIN B12 Take 1,000 mcg by mouth daily.   estradiol 0.1 MG/GM vaginal cream Commonly known as: ESTRACE Place 1 Applicatorful vaginally 2 (two) times a week.   fluticasone 50 MCG/ACT nasal spray Commonly known as: FLONASE Place 2 sprays into both nostrils daily as needed for allergies or rhinitis.   Glucosamine-Chondroitin 500-400 MG Caps Take 2 capsules by mouth daily.   levothyroxine 100 MCG tablet Commonly known as: SYNTHROID Take 100 mcg by mouth daily before breakfast.   loratadine 10 MG tablet Commonly known as: CLARITIN Take 10 mg by mouth daily as needed for allergies.   metoprolol succinate 100 MG 24 hr tablet Commonly known as: TOPROL-XL Take 100 mg by mouth daily.   metroNIDAZOLE 0.75 % cream Commonly known as: METROCREAM Apply 1 application topically 2 (two) times daily as needed (rosacea.).   metroNIDAZOLE 500 MG tablet Commonly known as: FLAGYL Take 1 tablet (500 mg total) by mouth 2 (two) times daily for 5 days. What changed: when to take this   pantoprazole 20 MG tablet Commonly known as:  Protonix Take 1 tablet (20 mg total) by mouth daily for 5 days.   PROBIOTIC PO Take 1 capsule by mouth daily after lunch.   sertraline 100 MG tablet Commonly known as: ZOLOFT Take 100 mg by mouth daily.   sucralfate 1 GM/10ML suspension Commonly known as: Carafate Take 10 mLs (1 g total) by mouth 4 (four) times daily for 5 days.   Vitamin D3 50 MCG (2000 UT) capsule Take 2,000 Units by mouth daily.        Follow-up  Information     Mary Beltran, DO Follow up in 1 week(s).   Specialty: Surgery Why: diverticulitis Contact information: [address] [place] [address] [phone]                  Total time spent arranging discharge was >98mn. Signed: IBenjamine Sprague1/12/2022, 12:42 PM

## 2022-05-17 DIAGNOSIS — K5720 Diverticulitis of large intestine with perforation and abscess without bleeding: Secondary | ICD-10-CM | POA: Diagnosis not present

## 2022-05-17 DIAGNOSIS — F3341 Major depressive disorder, recurrent, in partial remission: Secondary | ICD-10-CM | POA: Diagnosis not present

## 2022-05-17 DIAGNOSIS — I10 Essential (primary) hypertension: Secondary | ICD-10-CM | POA: Diagnosis not present

## 2022-05-19 ENCOUNTER — Encounter (INDEPENDENT_AMBULATORY_CARE_PROVIDER_SITE_OTHER): Payer: PPO | Admitting: Nurse Practitioner

## 2022-05-23 DIAGNOSIS — K5720 Diverticulitis of large intestine with perforation and abscess without bleeding: Secondary | ICD-10-CM | POA: Diagnosis not present

## 2022-05-30 DIAGNOSIS — H04123 Dry eye syndrome of bilateral lacrimal glands: Secondary | ICD-10-CM | POA: Diagnosis not present

## 2022-05-30 DIAGNOSIS — Z961 Presence of intraocular lens: Secondary | ICD-10-CM | POA: Diagnosis not present

## 2022-05-30 DIAGNOSIS — H43813 Vitreous degeneration, bilateral: Secondary | ICD-10-CM | POA: Diagnosis not present

## 2022-06-08 DIAGNOSIS — F410 Panic disorder [episodic paroxysmal anxiety] without agoraphobia: Secondary | ICD-10-CM | POA: Diagnosis not present

## 2022-06-08 DIAGNOSIS — F3342 Major depressive disorder, recurrent, in full remission: Secondary | ICD-10-CM | POA: Diagnosis not present

## 2022-06-08 DIAGNOSIS — F411 Generalized anxiety disorder: Secondary | ICD-10-CM | POA: Diagnosis not present

## 2022-06-09 DIAGNOSIS — L538 Other specified erythematous conditions: Secondary | ICD-10-CM | POA: Diagnosis not present

## 2022-06-09 DIAGNOSIS — D2262 Melanocytic nevi of left upper limb, including shoulder: Secondary | ICD-10-CM | POA: Diagnosis not present

## 2022-06-09 DIAGNOSIS — L820 Inflamed seborrheic keratosis: Secondary | ICD-10-CM | POA: Diagnosis not present

## 2022-06-09 DIAGNOSIS — D2261 Melanocytic nevi of right upper limb, including shoulder: Secondary | ICD-10-CM | POA: Diagnosis not present

## 2022-06-09 DIAGNOSIS — X32XXXA Exposure to sunlight, initial encounter: Secondary | ICD-10-CM | POA: Diagnosis not present

## 2022-06-09 DIAGNOSIS — Z85828 Personal history of other malignant neoplasm of skin: Secondary | ICD-10-CM | POA: Diagnosis not present

## 2022-06-09 DIAGNOSIS — L570 Actinic keratosis: Secondary | ICD-10-CM | POA: Diagnosis not present

## 2022-06-09 DIAGNOSIS — D2271 Melanocytic nevi of right lower limb, including hip: Secondary | ICD-10-CM | POA: Diagnosis not present

## 2022-07-06 DIAGNOSIS — L909 Atrophic disorder of skin, unspecified: Secondary | ICD-10-CM | POA: Diagnosis not present

## 2022-07-06 DIAGNOSIS — M2142 Flat foot [pes planus] (acquired), left foot: Secondary | ICD-10-CM | POA: Diagnosis not present

## 2022-07-06 DIAGNOSIS — M2141 Flat foot [pes planus] (acquired), right foot: Secondary | ICD-10-CM | POA: Diagnosis not present

## 2022-07-06 DIAGNOSIS — G629 Polyneuropathy, unspecified: Secondary | ICD-10-CM | POA: Diagnosis not present

## 2022-07-06 DIAGNOSIS — M7581 Other shoulder lesions, right shoulder: Secondary | ICD-10-CM | POA: Diagnosis not present

## 2022-07-06 DIAGNOSIS — M216X1 Other acquired deformities of right foot: Secondary | ICD-10-CM | POA: Diagnosis not present

## 2022-07-06 DIAGNOSIS — M216X2 Other acquired deformities of left foot: Secondary | ICD-10-CM | POA: Diagnosis not present

## 2022-07-06 DIAGNOSIS — M2041 Other hammer toe(s) (acquired), right foot: Secondary | ICD-10-CM | POA: Diagnosis not present

## 2022-07-06 DIAGNOSIS — M2042 Other hammer toe(s) (acquired), left foot: Secondary | ICD-10-CM | POA: Diagnosis not present

## 2022-07-06 DIAGNOSIS — M722 Plantar fascial fibromatosis: Secondary | ICD-10-CM | POA: Diagnosis not present

## 2022-07-06 DIAGNOSIS — M79671 Pain in right foot: Secondary | ICD-10-CM | POA: Diagnosis not present

## 2022-07-22 DIAGNOSIS — E034 Atrophy of thyroid (acquired): Secondary | ICD-10-CM | POA: Diagnosis not present

## 2022-07-22 DIAGNOSIS — E7800 Pure hypercholesterolemia, unspecified: Secondary | ICD-10-CM | POA: Diagnosis not present

## 2022-07-22 DIAGNOSIS — I10 Essential (primary) hypertension: Secondary | ICD-10-CM | POA: Diagnosis not present

## 2022-07-22 DIAGNOSIS — M4316 Spondylolisthesis, lumbar region: Secondary | ICD-10-CM | POA: Diagnosis not present

## 2022-07-22 DIAGNOSIS — K219 Gastro-esophageal reflux disease without esophagitis: Secondary | ICD-10-CM | POA: Diagnosis not present

## 2022-07-25 DIAGNOSIS — K5720 Diverticulitis of large intestine with perforation and abscess without bleeding: Secondary | ICD-10-CM | POA: Diagnosis not present

## 2022-07-29 DIAGNOSIS — I10 Essential (primary) hypertension: Secondary | ICD-10-CM | POA: Diagnosis not present

## 2022-07-29 DIAGNOSIS — Z780 Asymptomatic menopausal state: Secondary | ICD-10-CM | POA: Diagnosis not present

## 2022-07-29 DIAGNOSIS — E034 Atrophy of thyroid (acquired): Secondary | ICD-10-CM | POA: Diagnosis not present

## 2022-07-29 DIAGNOSIS — F419 Anxiety disorder, unspecified: Secondary | ICD-10-CM | POA: Diagnosis not present

## 2022-07-29 DIAGNOSIS — G629 Polyneuropathy, unspecified: Secondary | ICD-10-CM | POA: Diagnosis not present

## 2022-07-29 DIAGNOSIS — K219 Gastro-esophageal reflux disease without esophagitis: Secondary | ICD-10-CM | POA: Diagnosis not present

## 2022-07-29 DIAGNOSIS — F3341 Major depressive disorder, recurrent, in partial remission: Secondary | ICD-10-CM | POA: Diagnosis not present

## 2022-07-29 DIAGNOSIS — E7800 Pure hypercholesterolemia, unspecified: Secondary | ICD-10-CM | POA: Diagnosis not present

## 2022-08-01 DIAGNOSIS — M2041 Other hammer toe(s) (acquired), right foot: Secondary | ICD-10-CM | POA: Diagnosis not present

## 2022-08-01 DIAGNOSIS — M2042 Other hammer toe(s) (acquired), left foot: Secondary | ICD-10-CM | POA: Diagnosis not present

## 2022-08-01 DIAGNOSIS — M2142 Flat foot [pes planus] (acquired), left foot: Secondary | ICD-10-CM | POA: Diagnosis not present

## 2022-08-01 DIAGNOSIS — M216X2 Other acquired deformities of left foot: Secondary | ICD-10-CM | POA: Diagnosis not present

## 2022-08-01 DIAGNOSIS — G629 Polyneuropathy, unspecified: Secondary | ICD-10-CM | POA: Diagnosis not present

## 2022-08-01 DIAGNOSIS — M722 Plantar fascial fibromatosis: Secondary | ICD-10-CM | POA: Diagnosis not present

## 2022-08-01 DIAGNOSIS — M2141 Flat foot [pes planus] (acquired), right foot: Secondary | ICD-10-CM | POA: Diagnosis not present

## 2022-08-01 DIAGNOSIS — M216X1 Other acquired deformities of right foot: Secondary | ICD-10-CM | POA: Diagnosis not present

## 2022-08-01 DIAGNOSIS — I872 Venous insufficiency (chronic) (peripheral): Secondary | ICD-10-CM | POA: Diagnosis not present

## 2022-08-01 DIAGNOSIS — L909 Atrophic disorder of skin, unspecified: Secondary | ICD-10-CM | POA: Diagnosis not present

## 2022-08-01 DIAGNOSIS — M79671 Pain in right foot: Secondary | ICD-10-CM | POA: Diagnosis not present

## 2022-08-04 ENCOUNTER — Other Ambulatory Visit: Payer: Self-pay | Admitting: Surgery

## 2022-08-04 DIAGNOSIS — K5720 Diverticulitis of large intestine with perforation and abscess without bleeding: Secondary | ICD-10-CM

## 2022-08-11 ENCOUNTER — Ambulatory Visit
Admission: RE | Admit: 2022-08-11 | Discharge: 2022-08-11 | Disposition: A | Discharge status: Home / Self Care | Payer: HMO | Source: Ambulatory Visit | Attending: Surgery | Admitting: Surgery

## 2022-08-11 DIAGNOSIS — K5730 Diverticulosis of large intestine without perforation or abscess without bleeding: Secondary | ICD-10-CM | POA: Diagnosis not present

## 2022-08-11 DIAGNOSIS — K5720 Diverticulitis of large intestine with perforation and abscess without bleeding: Secondary | ICD-10-CM | POA: Insufficient documentation

## 2022-08-11 DIAGNOSIS — K449 Diaphragmatic hernia without obstruction or gangrene: Secondary | ICD-10-CM | POA: Diagnosis not present

## 2022-08-11 MED ORDER — IOHEXOL 300 MG/ML  SOLN
100.0000 mL | Freq: Once | INTRAMUSCULAR | Status: AC | PRN
  Administered 2022-08-11: 100 mL via INTRAVENOUS

## 2022-08-12 ENCOUNTER — Other Ambulatory Visit: Payer: HMO

## 2022-09-07 DIAGNOSIS — G629 Polyneuropathy, unspecified: Secondary | ICD-10-CM | POA: Diagnosis not present

## 2022-09-07 DIAGNOSIS — I872 Venous insufficiency (chronic) (peripheral): Secondary | ICD-10-CM | POA: Diagnosis not present

## 2022-09-07 DIAGNOSIS — M2142 Flat foot [pes planus] (acquired), left foot: Secondary | ICD-10-CM | POA: Diagnosis not present

## 2022-09-07 DIAGNOSIS — M722 Plantar fascial fibromatosis: Secondary | ICD-10-CM | POA: Diagnosis not present

## 2022-09-07 DIAGNOSIS — L909 Atrophic disorder of skin, unspecified: Secondary | ICD-10-CM | POA: Diagnosis not present

## 2022-09-07 DIAGNOSIS — M216X1 Other acquired deformities of right foot: Secondary | ICD-10-CM | POA: Diagnosis not present

## 2022-09-07 DIAGNOSIS — M2042 Other hammer toe(s) (acquired), left foot: Secondary | ICD-10-CM | POA: Diagnosis not present

## 2022-09-07 DIAGNOSIS — M2041 Other hammer toe(s) (acquired), right foot: Secondary | ICD-10-CM | POA: Diagnosis not present

## 2022-09-07 DIAGNOSIS — M216X2 Other acquired deformities of left foot: Secondary | ICD-10-CM | POA: Diagnosis not present

## 2022-09-07 DIAGNOSIS — M2141 Flat foot [pes planus] (acquired), right foot: Secondary | ICD-10-CM | POA: Diagnosis not present

## 2022-09-26 DIAGNOSIS — F3342 Major depressive disorder, recurrent, in full remission: Secondary | ICD-10-CM | POA: Diagnosis not present

## 2022-09-26 DIAGNOSIS — F411 Generalized anxiety disorder: Secondary | ICD-10-CM | POA: Diagnosis not present

## 2022-09-26 DIAGNOSIS — F410 Panic disorder [episodic paroxysmal anxiety] without agoraphobia: Secondary | ICD-10-CM | POA: Diagnosis not present

## 2022-10-14 DIAGNOSIS — F410 Panic disorder [episodic paroxysmal anxiety] without agoraphobia: Secondary | ICD-10-CM | POA: Diagnosis not present

## 2022-10-14 DIAGNOSIS — F411 Generalized anxiety disorder: Secondary | ICD-10-CM | POA: Diagnosis not present

## 2022-10-14 DIAGNOSIS — F3342 Major depressive disorder, recurrent, in full remission: Secondary | ICD-10-CM | POA: Diagnosis not present

## 2022-10-21 DIAGNOSIS — M545 Low back pain, unspecified: Secondary | ICD-10-CM | POA: Diagnosis not present

## 2022-10-21 DIAGNOSIS — R1031 Right lower quadrant pain: Secondary | ICD-10-CM | POA: Diagnosis not present

## 2022-10-21 DIAGNOSIS — R29898 Other symptoms and signs involving the musculoskeletal system: Secondary | ICD-10-CM | POA: Diagnosis not present

## 2022-10-21 DIAGNOSIS — M160 Bilateral primary osteoarthritis of hip: Secondary | ICD-10-CM | POA: Diagnosis not present

## 2022-10-21 DIAGNOSIS — M8588 Other specified disorders of bone density and structure, other site: Secondary | ICD-10-CM | POA: Diagnosis not present

## 2022-10-21 DIAGNOSIS — M25552 Pain in left hip: Secondary | ICD-10-CM | POA: Diagnosis not present

## 2022-10-21 DIAGNOSIS — M4316 Spondylolisthesis, lumbar region: Secondary | ICD-10-CM | POA: Diagnosis not present

## 2022-10-21 DIAGNOSIS — M47816 Spondylosis without myelopathy or radiculopathy, lumbar region: Secondary | ICD-10-CM | POA: Diagnosis not present

## 2022-11-24 NOTE — Therapy (Deleted)
OUTPATIENT PHYSICAL THERAPY EVALUATION   Patient Name: Mary Elliott MRN: [id number] DOB:April 09, 1941, 82 y.o., female Today's Date: 11/24/2022  END OF SESSION:   Past Medical History:  Diagnosis Date   Anemia    Anxiety    Arthritis    fingers   Cancer (HCC) 1999   basil cell on face   GERD (gastroesophageal reflux disease)    history of   History of kidney stones    twice   HOH (hard of hearing)    Hypertension    Hypothyroidism    PONV (postoperative nausea and vomiting)    nausea only with 2 prior surgeries   Tinnitus    Past Surgical History:  Procedure Laterality Date   ABDOMINAL HYSTERECTOMY     basil cell     face   BLEPHAROPLASTY     CATARACT EXTRACTION W/PHACO Left 08/26/2020   Procedure: CATARACT EXTRACTION PHACO AND INTRAOCULAR LENS PLACEMENT (IOC) LEFT PANOPTIX TORIC;  Surgeon: Lockie Mola, MD;  Location: [facility];  Service: Ophthalmology;  Laterality: Left;  8.88 1.:27.2 10.1%   CATARACT EXTRACTION W/PHACO Right 09/09/2020   Procedure: CATARACT EXTRACTION PHACO AND INTRAOCULAR LENS PLACEMENT (IOC) RIGHT PANOPTIX TORIC 8.54 01:03.3 13.5%;  Surgeon: Lockie Mola, MD;  Location: [facility];  Service: Ophthalmology;  Laterality: Right;   CYSTOCELE REPAIR  2005   HEMORROIDECTOMY     JOINT REPLACEMENT Bilateral 11/2016   KNEE ARTHROPLASTY Left 11/14/2016   Procedure: COMPUTER ASSISTED TOTAL KNEE ARTHROPLASTY;  Surgeon: Donato Heinz, MD;  Location: [facility];  Service: Orthopedics;  Laterality: Left;   KNEE ARTHROPLASTY Right 03/15/2017   Procedure: COMPUTER ASSISTED TOTAL KNEE ARTHROPLASTY;  Surgeon: Donato Heinz, MD;  Location: [facility];  Service: Orthopedics;  Laterality: Right;   KNEE ARTHROSCOPY Left    LIPOSUCTION     PELVIC FLOOR REPAIR     SHOULDER ARTHROSCOPY WITH DEBRIDEMENT AND BICEP TENDON REPAIR Left 09/24/2019   Procedure: Left shoulder arthroscopy with debridement, decompression, rotator cuff repair, and  possible biceps tenodesis.;  Surgeon: Christena Flake, MD;  Location: [facility];  Service: Orthopedics;  Laterality: Left;   TOE SURGERY Right    TONSILLECTOMY     VARICOSE VEIN SURGERY     Patient Active Problem List   Diagnosis Date Noted   Diverticulitis large intestine 05/08/2022   S/P total knee arthroplasty 11/14/2016    PCP: Lynnea Ferrier, MD  REFERRING PROVIDER: Dayton Bailiff, PA  REFERRING DIAG: right groin pain, acute bilateral low back pain without sciatica, left hip pain, weakness of both legs  Rationale for Evaluation and Treatment: Rehabilitation  THERAPY DIAG:  No diagnosis found.  ONSET DATE: ***  SUBJECTIVE:  SUBJECTIVE STATEMENT: ***  PERTINENT HISTORY:  Patient is a 82 y.o. female who presents to outpatient physical therapy with a referral for medical diagnosis right groin pain, acute bilateral low back pain without sciatica, left hip pain, weakness of both legs. This patient's chief complaints consist of ***, leading to the following functional deficits: ***. Relevant past medical history and comorbidities include she has S/P total knee arthroplasty and Diverticulitis large intestine on their problem list. She  has a past medical history of Anemia, Anxiety, Arthritis, Cancer (HCC) (1999), GERD (gastroesophageal reflux disease), History of kidney stones, HOH (hard of hearing), Hypertension, Hypothyroidism, PONV (postoperative nausea and vomiting), and Tinnitus. She  has a past surgical history that includes Hemorroidectomy; Knee arthroscopy (Left); Liposuction; Blepharoplasty; Toe Surgery (Right); Varicose vein surgery; basil cell; Pelvic floor repair; Abdominal hysterectomy; Knee Arthroplasty (Left, 11/14/2016); Knee Arthroplasty (Right, 03/15/2017); Joint replacement (Bilateral,  11/2016); Cystocele repair (2005); Tonsillectomy; Shoulder arthroscopy with debridement and bicep tendon repair (Left, 09/24/2019); Cataract extraction w/PHACO (Left, 08/26/2020); and Cataract extraction w/PHACO (Right, 09/09/2020). Patient denies hx of {redflags:27294}  PAIN:  Are you having pain? Yes NPRS: Current: ***/10,  Best: ***/10, Worst: ***/10. Pain location: *** Pain description: *** Aggravating factors: *** Relieving factors: ***   FUNCTIONAL LIMITATIONS: ***  LEISURE: ***  PRECAUTIONS: {Therapy precautions:24002}  WEIGHT BEARING RESTRICTIONS: {Yes ***/No:24003}  FALLS:  Has patient fallen in last 6 months? {fallsyesno:27318}  LIVING ENVIRONMENT: Lives with: {[facility] lives with:25569::"lives with their family"} Lives in: {Lives in:25570} Stairs: {opstairs:27293} Has following equipment at home: {Assistive devices:23999}  OCCUPATION: ***  PLOF: {PLOF:24004}  PATIENT GOALS: ***  NEXT MD VISIT: ***  OBJECTIVE  DIAGNOSTIC FINDINGS:  Abdomen/pelvis CT report from 08/13/2022:  IMPRESSION: Resolution of sigmoid diverticulitis and small diverticular abscess since prior study.   Stable small hiatal hernia.   Stable pelvic floor laxity with small cystocele and rectocele.   Aortic Atherosclerosis (ICD10-I70.0).  Lumbar MRI report from 08/31/2019:  CLINICAL DATA:  82 year old female with low back pain and heaviness radiating down both thighs.   EXAM: MRI LUMBAR SPINE WITHOUT CONTRAST   TECHNIQUE: Multiplanar, multisequence MR imaging of the lumbar spine was performed. No intravenous contrast was administered.   COMPARISON:  Lumbar MRI 04/15/2003. CT Abdomen and Pelvis 08/07/2019.   FINDINGS: Segmentation:  Normal on the comparison CT.   Alignment: Stable lumbar lordosis from the recent CT. Grade 1 anterolisthesis at L4-L5 has increased by 1-2 mm since 2004, now up to 7 mm. Minimal levoconvex lumbar scoliosis.   Vertebrae: Patchy degenerative appearing  marrow edema in the left L2-L3 endplates and posterior L3 vertebral body. Normal background bone marrow signal. No other marrow edema or acute osseous abnormality. Intact visible sacrum and SI joints.   Conus medullaris and cauda equina: Conus extends to the L1 level. No lower spinal cord or conus signal abnormality.   Paraspinal and other soft tissues: Negative.   Disc levels:   No visible lower thoracic spinal stenosis through T12-L1.   L1-L2:  Mild disc bulging and facet hypertrophy. No stenosis.   L2-L3: Left eccentric circumferential disc bulge, leftward endplate spurring. Broad-based left foraminal component of disc. Mild posterior element hypertrophy. Trace degenerative facet joint fluid.   Mild spinal and left lateral recess stenosis (left L3 nerve level). Moderate left L2 foraminal stenosis.   L3-L4: Circumferential, mostly far lateral disc bulging and endplate spurring. Mild to moderate posterior element hypertrophy. No significant spinal or lateral recess stenosis. Mild left and moderate right L3 foraminal stenosis.   L4-L5: Chronic  anterolisthesis with circumferential disc/pseudo disc. Moderate to severe facet and ligament flavum hypertrophy. Mild spinal stenosis. Mild to moderate left and moderate to severe right lateral recess stenosis (L5 nerve levels). Moderate bilateral L4 foraminal stenosis.   L5-S1: Negative disc. Moderate posterior element hypertrophy. Mild right lateral recess stenosis (right S1 nerve level).   IMPRESSION: 1. Chronic grade 1 anterolisthesis at L4-L5 has modestly increased since 2004. Chronic disc and advanced posterior element degeneration. Subsequent mild spinal stenosis with moderate to severe right and moderate left lateral recess stenosis. Moderate bilateral foraminal stenosis.   2. L2-L3 disc and posterior element degeneration with mild degenerative appearing vertebral edema, mild spinal and moderate left L2 foraminal stenosis.  There is moderate foraminal stenosis also on the right at L3-L4.     Electronically Signed   By: Odessa Fleming M.D.   On: 08/28/2019 13:02  SELF- REPORTED FUNCTION FOTO score: ***/100 (hip questionnaire)  OBSERVATION/INSPECTION Posture Posture (seated): forward head, rounded shoulders, slumped in sitting.  Posture (standing): *** Posture correction: *** Anthropometrics Tremor: none Body composition: *** Muscle bulk: *** Skin: The incision sites appear to be healing well with no excessive redness, warmth, drainage or signs of infection present.  *** Edema: *** Functional Mobility Bed mobility: *** Transfers: *** Gait: grossly WFL for household and short community ambulation. More detailed gait analysis deferred to later date as needed. *** Stairs: ***  SPINE MOTION  LUMBAR SPINE AROM *Indicates pain Flexion: *** Extension: *** Side Flexion:   R ***  L *** Rotation:  R *** L *** Side glide:  R *** L *** Protraction: *** Retraction: ***   NEUROLOGICAL  Upper Motor Neuron Screen Babinski, Hoffman's and Clonus (ankle) negative bilaterally.  Dermatomes C2-T1 appears equal and intact to light touch except the following: *** L2-S2 appears equal and intact to light touch except the following: *** Deep Tendon Reflexes R/L  ***+/***+ Biceps brachii reflex (C5, C6) ***+/***+ Brachioradialis reflex (C6) ***+/***+ Triceps brachii reflex (C7) ***+/***+ Quadriceps reflex (L4) ***+/***+ Achilles reflex (S1)  SPINE MOTION  CERVICAL SPINE AROM *Indicates pain Flexion: *** Extension: *** Side Flexion:   R ***  L *** Rotation:  R *** L ***   PERIPHERAL JOINT MOTION (in degrees)  ACTIVE RANGE OF MOTION (AROM) *Indicates pain Date Date Date  Joint/Motion R/L R/L R/L  Shoulder     Flexion / / /  Extension / / /  Abduction  / / /  External rotation / / /  Internal rotation / / /  Elbow     Flexion  / / /  Extension  / / /  Wrist     Flexion / / /   Extension  / / /  Radial deviation / / /  Ulnar deviation / / /  Pronation / / /  Supination / / /  Hip     Flexion / / /  Extension  / / /  Abduction / / /  Adduction / / /  External rotation / / /  Internal rotation  / / /  Knee     Extension / / /  Flexoin / / /  Ankle/Foot     Dorsiflexion (knee ext) / / /  Dorsiflexion (knee flex) / / /  Plantarflexion / / /  Everison / / /  Inversion / / /  Great toe extension / / /  Great toe flexion / / /  Comments:   PASSIVE RANGE OF MOTION (PROM) *Indicates pain Date  Date Date  Joint/Motion R/L R/L R/L  Shoulder     Flexion / / /  Extension / / /  Abduction  / / /  External rotation / / /  Internal rotation / / /  Elbow     Flexion  / / /  Extension  / / /  Wrist     Flexion / / /  Extension  / / /  Radial deviation / / /  Ulnar deviation / / /  Pronation / / /  Supination / / /  Hip     Flexion  / / /  Extension  / / /  Abduction / / /  Adduction / / /  External rotation / / /  Internal rotation  / / /  Knee     Extension / / /  Flexion / / /  Ankle/Foot     Dorsiflexion (knee ext) / / /  Dorsiflexion (knee flex) / / /  Plantarflexion / / /  Everison / / /  Inversion / / /  Great toe extension / / /  Great toe flexion / / /  Comments:   MUSCLE PERFORMANCE (MMT):  *Indicates pain Date Date Date  Joint/Motion R/L R/L R/L  Shoulder     Flexion / / /  Abduction (C5) / / /  External rotation / / /  Internal rotation / / /  Extension / / /  Elbow     Flexion (C6) / / /  Extension (C7) / / /  Wrist     Flexion (C7) / / /  Extension (C6) / / /  Radial deviation / / /  Ulnar deviation (C8) / / /  Pronation / / /  Supination / / /  Hand     Thumb extension (C8) / / /  Finger abduction (T1) / / /  Grip (C8) / / /  Hip     Flexion (L1, L2) / / /  Extension (knee ext) / / /  Extension (knee flex) / / /  Abduction / / /  Adduction / / /  External rotation / / /  Internal rotation  / / /   Knee     Extension (L3) / / /  Flexion (S2) / / /  Ankle/Foot     Dorsiflexion (L4) / / /  Great toe extension (L5) / / /  Eversion (S1) / / /  Plantarflexion (S1) / / /  Inversion / / /  Pronation / / /  Great toe flexion / / /  Comments:   SPECIAL TESTS:  .Neurodynamictests .NeurodynamicUE .NeurodynamicLE .CspineInstability .CSPINESPECIALTESTS .SHOULDERSPECIALTESTCLUSTERS .HIPSPECIALTESTS .SIJSPECIALTESTS   SHOULDER SPECIAL TESTS RTC, Impingement, Anterior Instability (macrotrauma), Labral Tear: Painful arc test: R = ***, L = ***. Drop arm test: R = ***, L = ***. Hawkins-Kennedy test: R = ***, L = ***. Infraspinatus test: R = ***, L = ***. Apprehension test: R = ***, L = ***. Relocation test: R = ***, L = ***. Active compression test: R = ***, L = ***.  ACCESSORY MOTION: ***  PALPATION: ***  SUSTAINED POSITIONS TESTING:  ***  REPEATED MOTIONS TESTING: ***  FUNCTIONAL/BALANCE TESTS: Five Time Sit to Stand (5TSTS): *** seconds Functional Gait Assessment (FGA): ***/30 (see details above) Ten meter walking trial ( ): *** m/s Six Minute Walk Test ( ): *** feet Timed Up and Go (TUG): *** seconds   Dynamic Gait Index: ***/24 BERG Balance Scale: ***/56  Tinetti/POMA: ***/28 Timed Up and GO: *** seconds (average of 3 trials) Trial 1: *** Trial 2: *** Trial 3: *** Romberg test: -Narrow stance, eyes open: *** seconds -Narrow stance, eyes closed: *** seconds Sharpened Romberg test: -Tandem stance, eyes open: *** seconds -Tandem stance, eyes closed: *** seconds  Narrow stance, firm surface, eyes open: *** seconds Narrow stance, firm surface, eyes closed: *** seconds Narrow stance, compliant surface, eyes open: *** seconds Narrow stance, compliant surface, eyes closed: *** seconds Single leg stance, firm surface, eyes open: R= *** seconds, L= *** seconds Single leg stance, compliant surface, eyes open: R= *** seconds, L= *** seconds Gait speed:  *** m/s Functional reach test: *** inches  TODAY'S TREATMENT:      PATIENT EDUCATION:  Education details: *** Person educated: {Person educated:25204} Education method: {Education Method:25205} Education comprehension: {Education Comprehension:25206}  HOME EXERCISE PROGRAM: ***  ASSESSMENT:  CLINICAL IMPRESSION: Patient is a 82 y.o. female referred to outpatient physical therapy with a medical diagnosis of right groin pain, acute bilateral low back pain without sciatica, left hip pain, weakness of both legs who presents with signs and symptoms consistent with ***. Patient presents with significant *** impairments that are limiting ability to complete *** without difficulty. Patient will benefit from skilled physical therapy intervention to address current body structure impairments and activity limitations to improve function and work towards goals set in current POC in order to return to prior level of function or maximal functional improvement.    OBJECTIVE IMPAIRMENTS: {opptimpairments:25111}.   ACTIVITY LIMITATIONS: {activitylimitations:27494}  PARTICIPATION LIMITATIONS: {participationrestrictions:25113}  PERSONAL FACTORS: {Personal factors:25162} are also affecting patient's functional outcome.   REHAB POTENTIAL: {rehabpotential:25112}  CLINICAL DECISION MAKING: {clinical decision making:25114}  EVALUATION COMPLEXITY: {Evaluation complexity:25115}   GOALS: Goals reviewed with patient? {yes/no:20286}  SHORT TERM GOALS: Target date: 12/08/2022  Patient will be independent with initial home exercise program for self-management of symptoms. Baseline: {HEPbaseline4:27310} (11/24/22); Goal status: INITIAL   LONG TERM GOALS: Target date: 02/16/2023  Patient will be independent with a long-term home exercise program for self-management of symptoms.  Baseline: {HEPbaseline4:27310} (11/24/22); Goal status: INITIAL  2.  Patient will demonstrate improved FOTO to equal or  greater than *** by visit #*** to demonstrate improvement in overall condition and self-reported functional ability.  Baseline: *** (11/24/22); Goal status: INITIAL  3.  *** Baseline: *** (11/24/22); Goal status: INITIAL  4.  *** Baseline: *** (11/24/22); Goal status: INITIAL  5.  Patient will complete community, work and/or recreational activities without limitation due to current condition.  Baseline: *** (11/24/22); Goal status: INITIAL  6.  *** Baseline: *** Goal status: INITIAL   PLAN:  PT FREQUENCY: {rehab frequency:25116}  PT DURATION: {rehab duration:25117}  PLANNED INTERVENTIONS: {rehab planned interventions:25118::"Therapeutic exercises","Therapeutic activity","Neuromuscular re-education","Balance training","Gait training","Patient/Family education","Self Care","Joint mobilization"}.  PLAN FOR NEXT SESSION: ***   Cira Rue, PT,DPT 11/24/2022, 8:50 AM  [facility] [address] [place], [place] [address] P: [phone] I F: [phone]

## 2022-11-29 NOTE — Therapy (Unsigned)
OUTPATIENT PHYSICAL THERAPY EVALUATION   Patient Name: Mary Elliott MRN: [id number] DOB:1940/12/01, 82 y.o., female Today's Date: 11/30/2022  END OF SESSION:  PT End of Session - 11/30/22 1830     Visit Number 1    Number of Visits 13    Date for PT Re-Evaluation 02/22/23    Authorization Type [employer] reporting period from 11/30/2022    Progress Note Due on Visit 10    PT Start Time 1517    PT Stop Time 1600    PT Time Calculation (min) 43 min    Activity Tolerance Patient tolerated treatment well    Behavior During Therapy WFL for tasks assessed/performed             Past Medical History:  Diagnosis Date   Anemia    Anxiety    Arthritis    fingers   Cancer (HCC) 1999   basil cell on face   GERD (gastroesophageal reflux disease)    history of   History of kidney stones    twice   HOH (hard of hearing)    Hypertension    Hypothyroidism    PONV (postoperative nausea and vomiting)    nausea only with 2 prior surgeries   Tinnitus    Past Surgical History:  Procedure Laterality Date   ABDOMINAL HYSTERECTOMY     basil cell     face   BLEPHAROPLASTY     CATARACT EXTRACTION W/PHACO Left 08/26/2020   Procedure: CATARACT EXTRACTION PHACO AND INTRAOCULAR LENS PLACEMENT (IOC) LEFT PANOPTIX TORIC;  Surgeon: Lockie Mola, MD;  Location: [facility];  Service: Ophthalmology;  Laterality: Left;  8.88 1.:27.2 10.1%   CATARACT EXTRACTION W/PHACO Right 09/09/2020   Procedure: CATARACT EXTRACTION PHACO AND INTRAOCULAR LENS PLACEMENT (IOC) RIGHT PANOPTIX TORIC 8.54 01:03.3 13.5%;  Surgeon: Lockie Mola, MD;  Location: [facility];  Service: Ophthalmology;  Laterality: Right;   CYSTOCELE REPAIR  2005   HEMORROIDECTOMY     JOINT REPLACEMENT Bilateral 11/2016   KNEE ARTHROPLASTY Left 11/14/2016   Procedure: COMPUTER ASSISTED TOTAL KNEE ARTHROPLASTY;  Surgeon: Donato Heinz, MD;  Location: [facility];  Service: Orthopedics;  Laterality:  Left;   KNEE ARTHROPLASTY Right 03/15/2017   Procedure: COMPUTER ASSISTED TOTAL KNEE ARTHROPLASTY;  Surgeon: Donato Heinz, MD;  Location: [facility];  Service: Orthopedics;  Laterality: Right;   KNEE ARTHROSCOPY Left    LIPOSUCTION     PELVIC FLOOR REPAIR     SHOULDER ARTHROSCOPY WITH DEBRIDEMENT AND BICEP TENDON REPAIR Left 09/24/2019   Procedure: Left shoulder arthroscopy with debridement, decompression, rotator cuff repair, and possible biceps tenodesis.;  Surgeon: Christena Flake, MD;  Location: [facility];  Service: Orthopedics;  Laterality: Left;   TOE SURGERY Right    TONSILLECTOMY     VARICOSE VEIN SURGERY     Patient Active Problem List   Diagnosis Date Noted   Diverticulitis large intestine 05/08/2022   S/P total knee arthroplasty 11/14/2016    PCP: Lynnea Ferrier, MD  REFERRING PROVIDER: Dayton Bailiff, PA  REFERRING DIAG: right groin pain, acute bilateral low back pain without sciatica, left hip pain, weakness of both legs  Rationale for Evaluation and Treatment: Rehabilitation  THERAPY DIAG:  Other abnormalities of gait and mobility  History of falling  Muscle weakness (generalized)  Other low back pain  Chronic pain of both knees  ONSET DATE: possibly chronic but current exacerbation started approximately January 2024  SUBJECTIVE:  SUBJECTIVE STATEMENT: Patient states that in the beginning of this year on new year's eve, patient went to the emergency room for diverticulitis. She had never been to the hospital for it before. Circumstances over christmas and all lead her to put her husband's needs ahead of hers. She was admitted for several days while taking several strong antibiotics. Afterwards, she had BPPV (not her first episode). She states it was just when she went to bed  and lay down and went a certain direction. She had been treated years ago for that. She did better and got well. She even had a follow CT that showed her inflammation from the diverticulitis was gone and the abscess was gone. It was during this time that she was having balance problems when she walked. She sometimes would walk backwards or sometimes she would scissor her legs. She has not been doing that very much, but the last time she had a physical with her PCP (in June possibly), he suggested physical therapy for her balance. Her BPPV got better. She saw a PA when she was having some pain in her right groin and he got some xrays of her hips and back. He said he would put in an order for PT. She stopped by [facility] and they said they don't do PT for balance and they suggested she go to PT at the hospital where they said they would forward the message to them. In the meantime, before this all happened, her husband fell on May 3 at home. He was hospitalized for a week then went to rehab for 3 weeks. Patient states she was using the cane because it helps if she is going up an incline. Patient states she knows she forgets and is not using it right. She never got a call back about getting a PT. She then got a notification on MyChart for an appointment on 12/08/22. That is how she got here. She states just turning one way she can fall, and she cannot get up easy. She had both of her knees replaced in 2018. She cannot kneel on her knees. It feels like stinging/pinching pain between the bone and the floor. She does not have the strength to raise herself up. She is here for difficulty with balance and getting around easily and safely. She finds it embarrassing to fall. She fell yesterday. She was on the deck on a step stool and she was getting down and somehow she lost her balance and the next thing she knew she was siting on her buttocks. She didn't have her phone so she scooted along until she got to the right spot. She was  able to get herself up but she has some red spots on her knees from kneeling. She didn't tell her husband because he is still recuperating from a fall he had on Monday. She has been caring for her husband who is having difficulty with mobility. She states pain is not her largest concern.   She does have low back pain with prolonged standing. Irritated while preparing meals, relieved by sitting with feet up in recliner for 15 min.   Patient does have a problem with urinary incontinence that can be brought on by touching cold things in the freezer and when getting nervous.   Patient does have a history of low back pain and sciatica that she got epidurals for when she was working.   She states she has gained extra weight. She had a nerve conduction study by  Dr. Malvin Johns who said probably neuropathy. Right 3 toes remain numb after she had sciatica and epidurals. She had 3 foot surgeries on the right foot. She has pain in the bottom of her right heel.   PERTINENT HISTORY:  Patient is a 82 y.o. female who presents to outpatient physical therapy with a referral for medical diagnosis right groin pain, acute bilateral low back pain without sciatica, left hip pain, weakness of both legs. This patient's chief complaints consist of difficulty with mobility, unsteadiness on her feet, falls, low back pain with prolonged standing, and anterior knee pain with kneeling, leading to the following functional deficits: difficulty with kneeling, getting up from the ground, not falling, caring for her husband, stairs, walking, getting up from sitting, bending, lifting, carrying, gardening, cooking, prolonged standing. Relevant past medical history and comorbidities include plantar fasciitis, right foot pain (3 surgeries), osteopenia she has S/P total knee arthroplasty and Diverticulitis large intestine on their problem list. She  has a past medical history of Anemia, Anxiety, Arthritis, Cancer (HCC) (1999), GERD (gastroesophageal  reflux disease), History of kidney stones, HOH (hard of hearing), Hypertension, Hypothyroidism, PONV (postoperative nausea and vomiting), and Tinnitus. She  has a past surgical history that includes Hemorroidectomy; Knee arthroscopy (Left); Liposuction; Blepharoplasty; Toe Surgery (Right); Varicose vein surgery; basil cell; Pelvic floor repair; Abdominal hysterectomy; Knee Arthroplasty (Left, 11/14/2016); Knee Arthroplasty (Right, 03/15/2017); total knee replacement (Bilateral, 11/2016); Cystocele repair (2005); Tonsillectomy; Shoulder arthroscopy with debridement and bicep tendon repair (Left, 09/24/2019); Cataract extraction w/PHACO (Left, 08/26/2020); and Cataract extraction w/PHACO (Right, 09/09/2020). Patient denies hx of stroke, seizures, lung problems, heart problems, diabetes, unexplained weight loss, unexplained changes in bowel or bladder problems, and spinal surgery  PAIN:  Are you having pain? Yes NPRS: Current: 1/10 Pain location: soreness on bilateral anterior knees Pain description: "sore" Aggravating factors: kneeling and getting up from the fall Relieving factors: roll on arnica, tylenol extra strength   FUNCTIONAL LIMITATIONS: difficulty with kneeling, getting up from the ground, not falling, caring for her husband, stairs, walking, getting up from sitting, bending, lifting, carrying, gardening, cooking, prolonged standing.   LEISURE: gardening, gathering a few flowers, being with people, cooking  PRECAUTIONS: Fall  WEIGHT BEARING RESTRICTIONS: No  FALLS:  Has patient fallen in last 6 months? Yes. Number of falls unsure, most recent yesterday  LIVING ENVIRONMENT: Lives with: husband who has difficulty with health and mobility Lives in: ranch home Stairs: 3 steps to enter with bilateral handrails (can reach both if she comes in through the back door or garage) Has following equipment at home: Single point cane, walk-in shower, shower seat, grab bars in the shower, elevated toilet.    OCCUPATION: retired from the hospital in 2015, registered nurse  PLOF: Independent (started to get some help with yardwork because she did a lot of gardening that was too hard for her because she could not kneel in the dirt).   PATIENT GOALS: "stay on my feet" "be able to get up out of a chair" improve her core strength  OBJECTIVE  DIAGNOSTIC FINDINGS:  Abdomen/pelvis CT report from 08/13/2022:  IMPRESSION: Resolution of sigmoid diverticulitis and small diverticular abscess since prior study.   Stable small hiatal hernia.   Stable pelvic floor laxity with small cystocele and rectocele.   Aortic Atherosclerosis (ICD10-I70.0).  Lumbar MRI report from 08/31/2019:  CLINICAL DATA:  82 year old female with low back pain and heaviness radiating down both thighs.   EXAM: MRI LUMBAR SPINE WITHOUT CONTRAST   TECHNIQUE: Multiplanar, multisequence MR  imaging of the lumbar spine was performed. No intravenous contrast was administered.   COMPARISON:  Lumbar MRI 04/15/2003. CT Abdomen and Pelvis 08/07/2019.   FINDINGS: Segmentation:  Normal on the comparison CT.   Alignment: Stable lumbar lordosis from the recent CT. Grade 1 anterolisthesis at L4-L5 has increased by 1-2 mm since 2004, now up to 7 mm. Minimal levoconvex lumbar scoliosis.   Vertebrae: Patchy degenerative appearing marrow edema in the left L2-L3 endplates and posterior L3 vertebral body. Normal background bone marrow signal. No other marrow edema or acute osseous abnormality. Intact visible sacrum and SI joints.   Conus medullaris and cauda equina: Conus extends to the L1 level. No lower spinal cord or conus signal abnormality.   Paraspinal and other soft tissues: Negative.   Disc levels:   No visible lower thoracic spinal stenosis through T12-L1.   L1-L2:  Mild disc bulging and facet hypertrophy. No stenosis.   L2-L3: Left eccentric circumferential disc bulge, leftward endplate spurring. Broad-based left  foraminal component of disc. Mild posterior element hypertrophy. Trace degenerative facet joint fluid.   Mild spinal and left lateral recess stenosis (left L3 nerve level). Moderate left L2 foraminal stenosis.   L3-L4: Circumferential, mostly far lateral disc bulging and endplate spurring. Mild to moderate posterior element hypertrophy. No significant spinal or lateral recess stenosis. Mild left and moderate right L3 foraminal stenosis.   L4-L5: Chronic anterolisthesis with circumferential disc/pseudo disc. Moderate to severe facet and ligament flavum hypertrophy. Mild spinal stenosis. Mild to moderate left and moderate to severe right lateral recess stenosis (L5 nerve levels). Moderate bilateral L4 foraminal stenosis.   L5-S1: Negative disc. Moderate posterior element hypertrophy. Mild right lateral recess stenosis (right S1 nerve level).   IMPRESSION: 1. Chronic grade 1 anterolisthesis at L4-L5 has modestly increased since 2004. Chronic disc and advanced posterior element degeneration. Subsequent mild spinal stenosis with moderate to severe right and moderate left lateral recess stenosis. Moderate bilateral foraminal stenosis.   2. L2-L3 disc and posterior element degeneration with mild degenerative appearing vertebral edema, mild spinal and moderate left L2 foraminal stenosis. There is moderate foraminal stenosis also on the right at L3-L4.     Electronically Signed   By: Odessa Fleming M.D.   On: 08/28/2019 13:02  SELF- REPORTED FUNCTION FOTO score: 56/100 (balance questionnaire)  OBSERVATION/INSPECTION Posture Seated: forward head, rounded shoulders, slumped in sitting.  Standing: bilateral genuvalgus.  Anthropometrics Tremor: none Body composition: BMI 33.7 Edema: present B LE Functional Mobility Bed mobility: supine <> sit and rolling, mod I for increased time/effort Transfers: sit <> stand with B UE support, mod I Gait: ambulates with SPC in right hand using it or  carrying it with mild antalgic gait favoring the left LE, wide stance. Unsteady at times.  Stairs: not tested  NEUROLOGICAL Dermatomes L2-S2 appears equal and intact to light touch except the following: pt thinks the right side might be slightly less than left.    PERIPHERAL JOINT MOTION (in degrees) PASSIVE RANGE OF MOTION (PROM) 11/30/2022: B LE grossly WFL for basic mobility except limited L hip extension to ~ 10 degrees and R hip extension to neutral  MUSCLE PERFORMANCE (MMT):  *Indicates pain 11/30/22 Date Date  Joint/Motion R/L R/L R/L  Hip     Flexion (L1, L2) 4/4 / /  Abduction 4/4 / /  Knee     Extension (L3) 5/5 / /  Flexion (S2) 4+/5 / /  Ankle/Foot     Dorsiflexion (L4) 4/5 / /  Great toe  extension (L5) 4+/4+ / /  Eversion (S1) 5/5 / /  Plantarflexion (S1) 5/5 / /  Comments:  11/30/2022: R DF gives way after 3 seconds  SPECIAL TESTS:  LOWER LIMB NEURODYNAMIC TESTS Straight Leg Raise (Sciatic nerve)  R  = negative  L  = negative  FUNCTIONAL/BALANCE TESTS: Five Time Sit to Stand (5TSTS): 23 seconds with frequent LOB backwards including leaning against plinth and one failed attempt. No use of B UE on the plinth. From 18.5 inch plinth.   TODAY'S TREATMENT:    Education including on HEP with sit to stands  PATIENT EDUCATION:  Education details: Exercise purpose/form. Self management techniques. Education on diagnosis, prognosis, POC, anatomy and physiology of current condition. Education on HEP including handout. Person educated: Patient Education method: Explanation, Demonstration, Tactile cues, Verbal cues, and Handouts Education comprehension: verbalized understanding, returned demonstration, and needs further education  HOME EXERCISE PROGRAM: Access Code: [email or web address] URL: [email or web address] Date: 11/30/2022 Prepared by: Norton Blizzard  Exercises - Sit to Stand Without Arm Support  - 1 x daily - 3 sets - 5-10 reps  ASSESSMENT:  CLINICAL  IMPRESSION: Patient is a 82 y.o. female referred to outpatient physical therapy with a medical diagnosis of right groin pain, acute bilateral low back pain without sciatica, left hip pain, weakness of both legs who presents with signs and symptoms consistent with generalized weakness and unsteadiness on feet with difficulty with prolonged standing due to low back pain who presents as a fall risk. Patient has experienced recent falls and difficulty getting up from the floor. During her exam she demonstrated posterior bias with difficulty performing 5 Times Sit To Stand without loss of balance backwards or using B UE. She also expresses intolerance to kneeling after having both knees replaced and would benefit from desensitization training to improve her ability to knee so she can garden and get up from the floor more easily. She also has significant loss of hip extension ROM, which likely affects her gait and standing tolerance. Patient is finding it challenging to care for herself and her mobility challenged husband due to her functional limitations and falls. Patient presents with significant pain, ROM, joint stiffness, gait, balance, muscle performance (strength/power/endurance), motor control, allodynia with kneeling, and activity tolerance impairments that are limiting ability to complete kneeling, getting up from the ground, not falling, caring for her husband, stairs, walking, getting up from sitting, bending, lifting, carrying, gardening, cooking, prolonged standing without difficulty. Patient will benefit from skilled physical therapy intervention to address current body structure impairments and activity limitations to improve function and work towards goals set in current POC in order to return to prior level of function or maximal functional improvement.    OBJECTIVE IMPAIRMENTS: Abnormal gait, decreased activity tolerance, decreased balance, decreased endurance, decreased knowledge of condition,  decreased knowledge of use of DME, decreased mobility, difficulty walking, decreased ROM, decreased strength, hypomobility, increased edema, impaired perceived functional ability, impaired flexibility, obesity, and pain.   ACTIVITY LIMITATIONS: carrying, lifting, bending, standing, squatting, stairs, transfers, bed mobility, dressing, locomotion level, and caring for others  PARTICIPATION LIMITATIONS: meal prep, cleaning, laundry, interpersonal relationship, shopping, community activity, yard work, and   difficulty with kneeling, getting up from the ground, not falling, caring for her husband, stairs, walking, getting up from sitting, bending, lifting, carrying, gardening, cooking, prolonged standing  PERSONAL FACTORS: Age, Fitness, Past/current experiences, Time since onset of injury/illness/exacerbation, and 3+ comorbidities:   plantar fasciitis, right foot pain (3 surgeries), osteopenia she has S/P total knee arthroplasty  and Diverticulitis large intestine on their problem list. She  has a past medical history of Anemia, Anxiety, Arthritis, Cancer (HCC) (1999), GERD (gastroesophageal reflux disease), History of kidney stones, HOH (hard of hearing), Hypertension, Hypothyroidism, PONV (postoperative nausea and vomiting), and Tinnitus. She  has a past surgical history that includes Hemorroidectomy; Knee arthroscopy (Left); Liposuction; Blepharoplasty; Toe Surgery (Right); Varicose vein surgery; basil cell; Pelvic floor repair; Abdominal hysterectomy; Knee Arthroplasty (Left, 11/14/2016); Knee Arthroplasty (Right, 03/15/2017); total knee replacement (Bilateral, 11/2016); Cystocele repair (2005); Tonsillectomy; Shoulder arthroscopy with debridement and bicep tendon repair (Left, 09/24/2019); Cataract extraction w/PHACO (Left, 08/26/2020); and Cataract extraction w/PHACO (Right, 09/09/2020) are also affecting patient's functional outcome.   REHAB POTENTIAL: Good  CLINICAL DECISION MAKING: Evolving/moderate  complexity  EVALUATION COMPLEXITY: Moderate   GOALS: Goals reviewed with patient? No  SHORT TERM GOALS: Target date: 12/14/2022  Patient will be independent with initial home exercise program for self-management of symptoms. Baseline: Initial HEP provided at IE (11/30/22); Goal status: INITIAL   LONG TERM GOALS: Target date: 02/22/2023  Patient will be independent with a long-term home exercise program for self-management of symptoms.  Baseline: Initial HEP to be provided at visit 2 as appropriate (11/30/22); Goal status: INITIAL  2.  Patient will demonstrate improved FOTO by equal or greater than 10 points by visit #10 to demonstrate improvement in overall condition and self-reported functional ability.  Baseline: 56 (11/30/22); Goal status: INITIAL  3.  Patient will complete 5 Times Sit To Stand Test in equal or less than 15 seconds from 18.5 inch or less surface with no LOB or use of B UE to improve her ability to get around her home safely and carry items when getting up from a seated position.  Baseline: 23 seconds with frequent LOB backwards including leaning against plinth and one failed attempt. No use of B UE on the plinth. From 18.5 inch plinth (11/30/22); Goal status: INITIAL  4.  Patient will demonstrate the ability to complete the Floor Transfer Test in equal or less than 8.8 seconds to improve her ability to get up from the ground when gardening or after a fall.   Baseline: to be tested at a later date (11/30/22); Goal status: INITIAL  5.  Patient will complete community, work and/or recreational activities with 50% less  limitation due to current condition.  Baseline: difficulty with kneeling, getting up from the ground, not falling, caring for her husband, stairs, walking, getting up from sitting, bending, lifting, carrying, gardening, cooking, prolonged standing (11/30/22); Goal status: INITIAL  6.  Patient will score equal or greater than 25 on the Functional Gait  Assessment to demonstrate low fall risk.  Baseline: to be tested at later date as appropriate (11/30/2022);  Goal status: INITIAL   PLAN:  PT FREQUENCY: 1-2x/week  PT DURATION: 12 weeks  PLANNED INTERVENTIONS: Therapeutic exercises, Therapeutic activity, Neuromuscular re-education, Balance training, Gait training, Patient/Family education, Self Care, Joint mobilization, Stair training, DME instructions, Dry Needling, Cognitive remediation, Electrical stimulation, Spinal mobilization, Cryotherapy, Moist heat, Manual therapy, and Re-evaluation.  PLAN FOR NEXT SESSION: Complete FGA and Floor Transfer Test as appropriate. Update HEP as appropriate, progressive LE/core/functional strengthening and balance exercises. Education. Manual therapy as needed.    Cira Rue, PT,DPT 11/30/2022, 8:19 PM  [facility] [address] [place], [place] [address] P: [phone] I F: [phone]

## 2022-11-30 ENCOUNTER — Encounter: Payer: Self-pay | Admitting: Physical Therapy

## 2022-11-30 ENCOUNTER — Ambulatory Visit: Payer: HMO | Attending: Physician Assistant | Admitting: Physical Therapy

## 2022-11-30 DIAGNOSIS — M25561 Pain in right knee: Secondary | ICD-10-CM | POA: Diagnosis not present

## 2022-11-30 DIAGNOSIS — M5459 Other low back pain: Secondary | ICD-10-CM

## 2022-11-30 DIAGNOSIS — G8929 Other chronic pain: Secondary | ICD-10-CM | POA: Diagnosis not present

## 2022-11-30 DIAGNOSIS — M25562 Pain in left knee: Secondary | ICD-10-CM | POA: Diagnosis not present

## 2022-11-30 DIAGNOSIS — Z9181 History of falling: Secondary | ICD-10-CM

## 2022-11-30 DIAGNOSIS — M6281 Muscle weakness (generalized): Secondary | ICD-10-CM

## 2022-11-30 DIAGNOSIS — R2689 Other abnormalities of gait and mobility: Secondary | ICD-10-CM | POA: Diagnosis not present

## 2022-11-30 NOTE — Addendum Note (Signed)
Addended by: Norton Blizzard R on: 11/30/2022 08:20 PM   Modules accepted: Orders

## 2022-12-01 ENCOUNTER — Ambulatory Visit: Payer: HMO | Admitting: Physical Therapy

## 2022-12-05 ENCOUNTER — Encounter: Payer: Self-pay | Admitting: Physical Therapy

## 2022-12-05 ENCOUNTER — Ambulatory Visit: Payer: HMO

## 2022-12-05 DIAGNOSIS — Z9181 History of falling: Secondary | ICD-10-CM

## 2022-12-05 DIAGNOSIS — M6281 Muscle weakness (generalized): Secondary | ICD-10-CM

## 2022-12-05 DIAGNOSIS — M5459 Other low back pain: Secondary | ICD-10-CM

## 2022-12-05 DIAGNOSIS — G8929 Other chronic pain: Secondary | ICD-10-CM

## 2022-12-05 DIAGNOSIS — R2689 Other abnormalities of gait and mobility: Secondary | ICD-10-CM | POA: Diagnosis not present

## 2022-12-05 NOTE — Therapy (Signed)
OUTPATIENT PHYSICAL THERAPY TREATMENT   Patient Name: Mary Elliott MRN: [id number] DOB:08/11/1940, 82 y.o., female Today's Date: 12/05/2022  END OF SESSION:  PT End of Session - 12/05/22 1506     Visit Number 2    Number of Visits 13    Date for PT Re-Evaluation 02/22/23    Authorization Type [employer] reporting period from 11/30/2022    Progress Note Due on Visit 10    PT Start Time 1510    PT Stop Time 1553    PT Time Calculation (min) 43 min    Activity Tolerance Patient tolerated treatment well    Behavior During Therapy WFL for tasks assessed/performed              Past Medical History:  Diagnosis Date   Anemia    Anxiety    Arthritis    fingers   Cancer (HCC) 1999   basil cell on face   GERD (gastroesophageal reflux disease)    history of   History of kidney stones    twice   HOH (hard of hearing)    Hypertension    Hypothyroidism    PONV (postoperative nausea and vomiting)    nausea only with 2 prior surgeries   Tinnitus    Past Surgical History:  Procedure Laterality Date   ABDOMINAL HYSTERECTOMY     basil cell     face   BLEPHAROPLASTY     CATARACT EXTRACTION W/PHACO Left 08/26/2020   Procedure: CATARACT EXTRACTION PHACO AND INTRAOCULAR LENS PLACEMENT (IOC) LEFT PANOPTIX TORIC;  Surgeon: Lockie Mola, MD;  Location: [facility];  Service: Ophthalmology;  Laterality: Left;  8.88 1.:27.2 10.1%   CATARACT EXTRACTION W/PHACO Right 09/09/2020   Procedure: CATARACT EXTRACTION PHACO AND INTRAOCULAR LENS PLACEMENT (IOC) RIGHT PANOPTIX TORIC 8.54 01:03.3 13.5%;  Surgeon: Lockie Mola, MD;  Location: [facility];  Service: Ophthalmology;  Laterality: Right;   CYSTOCELE REPAIR  2005   HEMORROIDECTOMY     JOINT REPLACEMENT Bilateral 11/2016   KNEE ARTHROPLASTY Left 11/14/2016   Procedure: COMPUTER ASSISTED TOTAL KNEE ARTHROPLASTY;  Surgeon: Donato Heinz, MD;  Location: [facility];  Service: Orthopedics;   Laterality: Left;   KNEE ARTHROPLASTY Right 03/15/2017   Procedure: COMPUTER ASSISTED TOTAL KNEE ARTHROPLASTY;  Surgeon: Donato Heinz, MD;  Location: [facility];  Service: Orthopedics;  Laterality: Right;   KNEE ARTHROSCOPY Left    LIPOSUCTION     PELVIC FLOOR REPAIR     SHOULDER ARTHROSCOPY WITH DEBRIDEMENT AND BICEP TENDON REPAIR Left 09/24/2019   Procedure: Left shoulder arthroscopy with debridement, decompression, rotator cuff repair, and possible biceps tenodesis.;  Surgeon: Christena Flake, MD;  Location: [facility];  Service: Orthopedics;  Laterality: Left;   TOE SURGERY Right    TONSILLECTOMY     VARICOSE VEIN SURGERY     Patient Active Problem List   Diagnosis Date Noted   Diverticulitis large intestine 05/08/2022   S/P total knee arthroplasty 11/14/2016    PCP: Lynnea Ferrier, MD  REFERRING PROVIDER: Dayton Bailiff, PA  REFERRING DIAG: right groin pain, acute bilateral low back pain without sciatica, left hip pain, weakness of both legs  Rationale for Evaluation and Treatment: Rehabilitation  THERAPY DIAG:  Other low back pain  Other abnormalities of gait and mobility  Chronic pain of both knees  History of falling  Muscle weakness (generalized)  ONSET DATE: possibly chronic but current exacerbation started approximately January 2024  SUBJECTIVE:  SUBJECTIVE STATEMENT: Patient complaining of mild low back pain on arrival.   PERTINENT HISTORY:  Patient is a 82 y.o. female who presents to outpatient physical therapy with a referral for medical diagnosis right groin pain, acute bilateral low back pain without sciatica, left hip pain, weakness of both legs. This patient's chief complaints consist of difficulty with mobility, unsteadiness on her feet, falls, low back pain with  prolonged standing, and anterior knee pain with kneeling, leading to the following functional deficits: difficulty with kneeling, getting up from the ground, not falling, caring for her husband, stairs, walking, getting up from sitting, bending, lifting, carrying, gardening, cooking, prolonged standing. Relevant past medical history and comorbidities include plantar fasciitis, right foot pain (3 surgeries), osteopenia she has S/P total knee arthroplasty and Diverticulitis large intestine on their problem list. She  has a past medical history of Anemia, Anxiety, Arthritis, Cancer (HCC) (1999), GERD (gastroesophageal reflux disease), History of kidney stones, HOH (hard of hearing), Hypertension, Hypothyroidism, PONV (postoperative nausea and vomiting), and Tinnitus. She  has a past surgical history that includes Hemorroidectomy; Knee arthroscopy (Left); Liposuction; Blepharoplasty; Toe Surgery (Right); Varicose vein surgery; basil cell; Pelvic floor repair; Abdominal hysterectomy; Knee Arthroplasty (Left, 11/14/2016); Knee Arthroplasty (Right, 03/15/2017); total knee replacement (Bilateral, 11/2016); Cystocele repair (2005); Tonsillectomy; Shoulder arthroscopy with debridement and bicep tendon repair (Left, 09/24/2019); Cataract extraction w/PHACO (Left, 08/26/2020); and Cataract extraction w/PHACO (Right, 09/09/2020). Patient denies hx of stroke, seizures, lung problems, heart problems, diabetes, unexplained weight loss, unexplained changes in bowel or bladder problems, and spinal surgery  PAIN:  Are you having pain? Yes NPRS: Current: 1/10 Pain location: soreness on bilateral anterior knees Pain description: "sore" Aggravating factors: kneeling and getting up from the fall Relieving factors: roll on arnica, tylenol extra strength   FUNCTIONAL LIMITATIONS: difficulty with kneeling, getting up from the ground, not falling, caring for her husband, stairs, walking, getting up from sitting, bending, lifting, carrying,  gardening, cooking, prolonged standing.   LEISURE: gardening, gathering a few flowers, being with people, cooking  PRECAUTIONS: Fall  WEIGHT BEARING RESTRICTIONS: No  FALLS:  Has patient fallen in last 6 months? Yes. Number of falls unsure, most recent yesterday  LIVING ENVIRONMENT: Lives with: husband who has difficulty with health and mobility Lives in: ranch home Stairs: 3 steps to enter with bilateral handrails (can reach both if she comes in through the back door or garage) Has following equipment at home: Single point cane, walk-in shower, shower seat, grab bars in the shower, elevated toilet.   OCCUPATION: retired from the hospital in 2015, registered nurse  PLOF: Independent (started to get some help with yardwork because she did a lot of gardening that was too hard for her because she could not kneel in the dirt).   PATIENT GOALS: "stay on my feet" "be able to get up out of a chair" improve her core strength  OBJECTIVE  DIAGNOSTIC FINDINGS:  Abdomen/pelvis CT report from 08/13/2022:  IMPRESSION: Resolution of sigmoid diverticulitis and small diverticular abscess since prior study.   Stable small hiatal hernia.   Stable pelvic floor laxity with small cystocele and rectocele.   Aortic Atherosclerosis (ICD10-I70.0).  Lumbar MRI report from 08/31/2019:  CLINICAL DATA:  82 year old female with low back pain and heaviness radiating down both thighs.   EXAM: MRI LUMBAR SPINE WITHOUT CONTRAST   TECHNIQUE: Multiplanar, multisequence MR imaging of the lumbar spine was performed. No intravenous contrast was administered.   COMPARISON:  Lumbar MRI 04/15/2003. CT Abdomen and Pelvis 08/07/2019.  FINDINGS: Segmentation:  Normal on the comparison CT.   Alignment: Stable lumbar lordosis from the recent CT. Grade 1 anterolisthesis at L4-L5 has increased by 1-2 mm since 2004, now up to 7 mm. Minimal levoconvex lumbar scoliosis.   Vertebrae: Patchy degenerative appearing  marrow edema in the left L2-L3 endplates and posterior L3 vertebral body. Normal background bone marrow signal. No other marrow edema or acute osseous abnormality. Intact visible sacrum and SI joints.   Conus medullaris and cauda equina: Conus extends to the L1 level. No lower spinal cord or conus signal abnormality.   Paraspinal and other soft tissues: Negative.   Disc levels:   No visible lower thoracic spinal stenosis through T12-L1.   L1-L2:  Mild disc bulging and facet hypertrophy. No stenosis.   L2-L3: Left eccentric circumferential disc bulge, leftward endplate spurring. Broad-based left foraminal component of disc. Mild posterior element hypertrophy. Trace degenerative facet joint fluid.   Mild spinal and left lateral recess stenosis (left L3 nerve level). Moderate left L2 foraminal stenosis.   L3-L4: Circumferential, mostly far lateral disc bulging and endplate spurring. Mild to moderate posterior element hypertrophy. No significant spinal or lateral recess stenosis. Mild left and moderate right L3 foraminal stenosis.   L4-L5: Chronic anterolisthesis with circumferential disc/pseudo disc. Moderate to severe facet and ligament flavum hypertrophy. Mild spinal stenosis. Mild to moderate left and moderate to severe right lateral recess stenosis (L5 nerve levels). Moderate bilateral L4 foraminal stenosis.   L5-S1: Negative disc. Moderate posterior element hypertrophy. Mild right lateral recess stenosis (right S1 nerve level).   IMPRESSION: 1. Chronic grade 1 anterolisthesis at L4-L5 has modestly increased since 2004. Chronic disc and advanced posterior element degeneration. Subsequent mild spinal stenosis with moderate to severe right and moderate left lateral recess stenosis. Moderate bilateral foraminal stenosis.   2. L2-L3 disc and posterior element degeneration with mild degenerative appearing vertebral edema, mild spinal and moderate left L2 foraminal stenosis.  There is moderate foraminal stenosis also on the right at L3-L4.     Electronically Signed   By: Odessa Fleming M.D.   On: 08/28/2019 13:02  SELF- REPORTED FUNCTION FOTO score: 56/100 (balance questionnaire)  OBSERVATION/INSPECTION Posture Seated: forward head, rounded shoulders, slumped in sitting.  Standing: bilateral genuvalgus.  Anthropometrics Tremor: none Body composition: BMI 33.7 Edema: present B LE Functional Mobility Bed mobility: supine <> sit and rolling, mod I for increased time/effort Transfers: sit <> stand with B UE support, mod I Gait: ambulates with SPC in right hand using it or carrying it with mild antalgic gait favoring the left LE, wide stance. Unsteady at times.  Stairs: not tested  NEUROLOGICAL Dermatomes L2-S2 appears equal and intact to light touch except the following: pt thinks the right side might be slightly less than left.    PERIPHERAL JOINT MOTION (in degrees) PASSIVE RANGE OF MOTION (PROM) 11/30/2022: B LE grossly WFL for basic mobility except limited L hip extension to ~ 10 degrees and R hip extension to neutral  MUSCLE PERFORMANCE (MMT):  *Indicates pain 11/30/22 Date Date  Joint/Motion R/L R/L R/L  Hip     Flexion (L1, L2) 4/4 / /  Abduction 4/4 / /  Knee     Extension (L3) 5/5 / /  Flexion (S2) 4+/5 / /  Ankle/Foot     Dorsiflexion (L4) 4/5 / /  Great toe extension (L5) 4+/4+ / /  Eversion (S1) 5/5 / /  Plantarflexion (S1) 5/5 / /  Comments:  11/30/2022: R DF gives way after  3 seconds  SPECIAL TESTS:  LOWER LIMB NEURODYNAMIC TESTS Straight Leg Raise (Sciatic nerve)  R  = negative  L  = negative  FUNCTIONAL/BALANCE TESTS: Five Time Sit to Stand (5TSTS): 23 seconds with frequent LOB backwards including leaning against plinth and one failed attempt. No use of B UE on the plinth. From 18.5 inch plinth.   TODAY'S TREATMENT:     FGA completed: 18/30 Floor Transfer Test: unable to complete as patient unable to come into standing  from floor   TE:  Nustep level 2 (seat 8) x 6 minutes to improve lumbar mobility and pain modulation Sit to stand  x 10 from green chair with no UE support   HEP handout given Standing marching with UE support x 10 each LE  Standing hamstring curls with UE support x 10 each LE Standing hip abduction with UE support x 10 each LE Standing heel/toe raises with UE support x 10 each LE  PATIENT EDUCATION:  Education details: Exercise purpose/form. Self management techniques. Education on diagnosis, prognosis, POC, anatomy and physiology of current condition. Education on HEP including handout. Person educated: Patient Education method: Explanation, Demonstration, Tactile cues, Verbal cues, and Handouts Education comprehension: verbalized understanding, returned demonstration, and needs further education  HOME EXERCISE PROGRAM: Access Code: [email or web address] URL: [email or web address] Date: 11/30/2022 Prepared by: Norton Blizzard  Exercises - Sit to Stand Without Arm Support  - 1 x daily - 3 sets - 5-10 reps    Access Code: [email or web address] URL: [email or web address] Date: 12/05/2022 Prepared by: Maylon Peppers  Exercises - Standing March with Counter Support  - 2-3 x daily - 5-7 x weekly - 3 sets - 10 reps - Standing Knee Flexion with Unilateral Counter Support  - 2-3 x daily - 5-7 x weekly - 3 sets - 10 reps - Standing Hip Abduction with Unilateral Counter Support  - 2-3 x daily - 5-7 x weekly - 3 sets - 10 reps - Heel Toe Raises with Unilateral Counter Support  - 2-3 x daily - 5-7 x weekly - 3 sets - 10 reps  ASSESSMENT:  CLINICAL IMPRESSION:   Patient arrives to treatment session motivated to participate. Completed FGA and floor transfer test with patient scoring 18/30 on FGA and unable to come into standing or sitting in chair from floor this date. Required assistance from 2 people to get off floor. Provided patient with HEP program focused on standing hip exercises  with UE support. Patient will continue to benefit from skilled therapy to address remaining deficits in order to improve quality of life and return to PLOF.     From Initial Evaluation (7/24):  Patient is a 82 y.o. female referred to outpatient physical therapy with a medical diagnosis of right groin pain, acute bilateral low back pain without sciatica, left hip pain, weakness of both legs who presents with signs and symptoms consistent with generalized weakness and unsteadiness on feet with difficulty with prolonged standing due to low back pain who presents as a fall risk. Patient has experienced recent falls and difficulty getting up from the floor. During her exam she demonstrated posterior bias with difficulty performing 5 Times Sit To Stand without loss of balance backwards or using B UE. She also expresses intolerance to kneeling after having both knees replaced and would benefit from desensitization training to improve her ability to knee so she can garden and get up from the floor more easily. She also has significant loss of hip extension ROM, which likely affects her gait and  standing tolerance. Patient is finding it challenging to care for herself and her mobility challenged husband due to her functional limitations and falls. Patient presents with significant pain, ROM, joint stiffness, gait, balance, muscle performance (strength/power/endurance), motor control, allodynia with kneeling, and activity tolerance impairments that are limiting ability to complete kneeling, getting up from the ground, not falling, caring for her husband, stairs, walking, getting up from sitting, bending, lifting, carrying, gardening, cooking, prolonged standing without difficulty. Patient will benefit from skilled physical therapy intervention to address current body structure impairments and activity limitations to improve function and work towards goals set in current POC in order to return to prior level of function or  maximal functional improvement.    OBJECTIVE IMPAIRMENTS: Abnormal gait, decreased activity tolerance, decreased balance, decreased endurance, decreased knowledge of condition, decreased knowledge of use of DME, decreased mobility, difficulty walking, decreased ROM, decreased strength, hypomobility, increased edema, impaired perceived functional ability, impaired flexibility, obesity, and pain.   ACTIVITY LIMITATIONS: carrying, lifting, bending, standing, squatting, stairs, transfers, bed mobility, dressing, locomotion level, and caring for others  PARTICIPATION LIMITATIONS: meal prep, cleaning, laundry, interpersonal relationship, shopping, community activity, yard work, and   difficulty with kneeling, getting up from the ground, not falling, caring for her husband, stairs, walking, getting up from sitting, bending, lifting, carrying, gardening, cooking, prolonged standing  PERSONAL FACTORS: Age, Fitness, Past/current experiences, Time since onset of injury/illness/exacerbation, and 3+ comorbidities:   plantar fasciitis, right foot pain (3 surgeries), osteopenia she has S/P total knee arthroplasty and Diverticulitis large intestine on their problem list. She  has a past medical history of Anemia, Anxiety, Arthritis, Cancer (HCC) (1999), GERD (gastroesophageal reflux disease), History of kidney stones, HOH (hard of hearing), Hypertension, Hypothyroidism, PONV (postoperative nausea and vomiting), and Tinnitus. She  has a past surgical history that includes Hemorroidectomy; Knee arthroscopy (Left); Liposuction; Blepharoplasty; Toe Surgery (Right); Varicose vein surgery; basil cell; Pelvic floor repair; Abdominal hysterectomy; Knee Arthroplasty (Left, 11/14/2016); Knee Arthroplasty (Right, 03/15/2017); total knee replacement (Bilateral, 11/2016); Cystocele repair (2005); Tonsillectomy; Shoulder arthroscopy with debridement and bicep tendon repair (Left, 09/24/2019); Cataract extraction w/PHACO (Left, 08/26/2020);  and Cataract extraction w/PHACO (Right, 09/09/2020) are also affecting patient's functional outcome.   REHAB POTENTIAL: Good  CLINICAL DECISION MAKING: Evolving/moderate complexity  EVALUATION COMPLEXITY: Moderate   GOALS: Goals reviewed with patient? No  SHORT TERM GOALS: Target date: 12/14/2022  Patient will be independent with initial home exercise program for self-management of symptoms. Baseline: Initial HEP provided at IE (11/30/22); Goal status: INITIAL   LONG TERM GOALS: Target date: 02/22/2023  Patient will be independent with a long-term home exercise program for self-management of symptoms.  Baseline: Initial HEP to be provided at visit 2 as appropriate (11/30/22); Goal status: INITIAL  2.  Patient will demonstrate improved FOTO by equal or greater than 10 points by visit #10 to demonstrate improvement in overall condition and self-reported functional ability.  Baseline: 56 (11/30/22); Goal status: INITIAL  3.  Patient will complete 5 Times Sit To Stand Test in equal or less than 15 seconds from 18.5 inch or less surface with no LOB or use of B UE to improve her ability to get around her home safely and carry items when getting up from a seated position.  Baseline: 23 seconds with frequent LOB backwards including leaning against plinth and one failed attempt. No use of B UE on the plinth. From 18.5 inch plinth (11/30/22); Goal status: INITIAL  4.  Patient will demonstrate the ability to complete the Floor  Transfer Test in equal or less than 8.8 seconds to improve her ability to get up from the ground when gardening or after a fall.   Baseline: to be tested at a later date (11/30/22); Goal status: INITIAL  5.  Patient will complete community, work and/or recreational activities with 50% less  limitation due to current condition.  Baseline: difficulty with kneeling, getting up from the ground, not falling, caring for her husband, stairs, walking, getting up from sitting,  bending, lifting, carrying, gardening, cooking, prolonged standing (11/30/22); Goal status: INITIAL  6.  Patient will score equal or greater than 25 on the Functional Gait Assessment to demonstrate low fall risk.  Baseline: to be tested at later date as appropriate (11/30/2022);  Goal status: INITIAL   PLAN:  PT FREQUENCY: 1-2x/week  PT DURATION: 12 weeks  PLANNED INTERVENTIONS: Therapeutic exercises, Therapeutic activity, Neuromuscular re-education, Balance training, Gait training, Patient/Family education, Self Care, Joint mobilization, Stair training, DME instructions, Dry Needling, Cognitive remediation, Electrical stimulation, Spinal mobilization, Cryotherapy, Moist heat, Manual therapy, and Re-evaluation.  PLAN FOR NEXT SESSION: Update HEP as appropriate, progressive LE/core/functional strengthening and balance exercises. Education. Manual therapy as needed.    Viviann Spare, PT,DPT 12/05/2022, 3:06 PM  [facility] [address] [place], [place] [address] P: [phone] I F: [phone]

## 2022-12-08 ENCOUNTER — Ambulatory Visit: Payer: HMO | Admitting: Physical Therapy

## 2022-12-13 ENCOUNTER — Ambulatory Visit: Payer: HMO | Attending: Physician Assistant | Admitting: Physical Therapy

## 2022-12-13 ENCOUNTER — Encounter: Payer: Self-pay | Admitting: Physical Therapy

## 2022-12-13 VITALS — BP 133/81 | HR 54

## 2022-12-13 DIAGNOSIS — R2689 Other abnormalities of gait and mobility: Secondary | ICD-10-CM | POA: Diagnosis not present

## 2022-12-13 DIAGNOSIS — M25562 Pain in left knee: Secondary | ICD-10-CM | POA: Insufficient documentation

## 2022-12-13 DIAGNOSIS — G8929 Other chronic pain: Secondary | ICD-10-CM | POA: Diagnosis not present

## 2022-12-13 DIAGNOSIS — M25561 Pain in right knee: Secondary | ICD-10-CM | POA: Insufficient documentation

## 2022-12-13 DIAGNOSIS — M6281 Muscle weakness (generalized): Secondary | ICD-10-CM | POA: Diagnosis not present

## 2022-12-13 DIAGNOSIS — Z9181 History of falling: Secondary | ICD-10-CM | POA: Insufficient documentation

## 2022-12-13 DIAGNOSIS — M5459 Other low back pain: Secondary | ICD-10-CM | POA: Diagnosis not present

## 2022-12-13 NOTE — Therapy (Signed)
OUTPATIENT PHYSICAL THERAPY TREATMENT   Patient Name: Mary Elliott MRN: [id number] DOB:March 06, 1941, 82 y.o., female Today's Date: 12/13/2022  END OF SESSION:  PT End of Session - 12/13/22 1446     Visit Number 3    Number of Visits 13    Date for PT Re-Evaluation 02/22/23    Authorization Type [employer] reporting period from 11/30/2022    Progress Note Due on Visit 10    PT Start Time 1437    PT Stop Time 1515    PT Time Calculation (min) 38 min    Activity Tolerance Patient tolerated treatment well    Behavior During Therapy WFL for tasks assessed/performed               Past Medical History:  Diagnosis Date   Anemia    Anxiety    Arthritis    fingers   Cancer (HCC) 1999   basil cell on face   GERD (gastroesophageal reflux disease)    history of   History of kidney stones    twice   HOH (hard of hearing)    Hypertension    Hypothyroidism    PONV (postoperative nausea and vomiting)    nausea only with 2 prior surgeries   Tinnitus    Past Surgical History:  Procedure Laterality Date   ABDOMINAL HYSTERECTOMY     basil cell     face   BLEPHAROPLASTY     CATARACT EXTRACTION W/PHACO Left 08/26/2020   Procedure: CATARACT EXTRACTION PHACO AND INTRAOCULAR LENS PLACEMENT (IOC) LEFT PANOPTIX TORIC;  Surgeon: Lockie Mola, MD;  Location: [facility];  Service: Ophthalmology;  Laterality: Left;  8.88 1.:27.2 10.1%   CATARACT EXTRACTION W/PHACO Right 09/09/2020   Procedure: CATARACT EXTRACTION PHACO AND INTRAOCULAR LENS PLACEMENT (IOC) RIGHT PANOPTIX TORIC 8.54 01:03.3 13.5%;  Surgeon: Lockie Mola, MD;  Location: [facility];  Service: Ophthalmology;  Laterality: Right;   CYSTOCELE REPAIR  2005   HEMORROIDECTOMY     JOINT REPLACEMENT Bilateral 11/2016   KNEE ARTHROPLASTY Left 11/14/2016   Procedure: COMPUTER ASSISTED TOTAL KNEE ARTHROPLASTY;  Surgeon: Donato Heinz, MD;  Location: [facility];  Service: Orthopedics;   Laterality: Left;   KNEE ARTHROPLASTY Right 03/15/2017   Procedure: COMPUTER ASSISTED TOTAL KNEE ARTHROPLASTY;  Surgeon: Donato Heinz, MD;  Location: [facility];  Service: Orthopedics;  Laterality: Right;   KNEE ARTHROSCOPY Left    LIPOSUCTION     PELVIC FLOOR REPAIR     SHOULDER ARTHROSCOPY WITH DEBRIDEMENT AND BICEP TENDON REPAIR Left 09/24/2019   Procedure: Left shoulder arthroscopy with debridement, decompression, rotator cuff repair, and possible biceps tenodesis.;  Surgeon: Christena Flake, MD;  Location: [facility];  Service: Orthopedics;  Laterality: Left;   TOE SURGERY Right    TONSILLECTOMY     VARICOSE VEIN SURGERY     Patient Active Problem List   Diagnosis Date Noted   Diverticulitis large intestine 05/08/2022   S/P total knee arthroplasty 11/14/2016    PCP: Lynnea Ferrier, MD  REFERRING PROVIDER: Dayton Bailiff, PA  REFERRING DIAG: right groin pain, acute bilateral low back pain without sciatica, left hip pain, weakness of both legs  Rationale for Evaluation and Treatment: Rehabilitation  THERAPY DIAG:  Other low back pain  Other abnormalities of gait and mobility  Chronic pain of both knees  History of falling  Muscle weakness (generalized)  ONSET DATE: possibly chronic but current exacerbation started approximately January 2024  PERTINENT HISTORY:  Patient is a  82 y.o. female who presents to outpatient physical therapy with a referral for medical diagnosis right groin pain, acute bilateral low back pain without sciatica, left hip pain, weakness of both legs. This patient's chief complaints consist of difficulty with mobility, unsteadiness on her feet, falls, low back pain with prolonged standing, and anterior knee pain with kneeling, leading to the following functional deficits: difficulty with kneeling, getting up from the ground, not falling, caring for her husband, stairs, walking, getting up from sitting, bending, lifting, carrying, gardening, cooking,  prolonged standing. Relevant past medical history and comorbidities include plantar fasciitis, right foot pain (3 surgeries), osteopenia she has S/P total knee arthroplasty and Diverticulitis large intestine on their problem list. She  has a past medical history of Anemia, Anxiety, Arthritis, Cancer (HCC) (1999), GERD (gastroesophageal reflux disease), History of kidney stones, HOH (hard of hearing), Hypertension, Hypothyroidism, PONV (postoperative nausea and vomiting), and Tinnitus. She  has a past surgical history that includes Hemorroidectomy; Knee arthroscopy (Left); Liposuction; Blepharoplasty; Toe Surgery (Right); Varicose vein surgery; basil cell; Pelvic floor repair; Abdominal hysterectomy; Knee Arthroplasty (Left, 11/14/2016); Knee Arthroplasty (Right, 03/15/2017); total knee replacement (Bilateral, 11/2016); Cystocele repair (2005); Tonsillectomy; Shoulder arthroscopy with debridement and bicep tendon repair (Left, 09/24/2019); Cataract extraction w/PHACO (Left, 08/26/2020); and Cataract extraction w/PHACO (Right, 09/09/2020). Patient denies hx of stroke, seizures, lung problems, heart problems, diabetes, unexplained weight loss, unexplained changes in bowel or bladder problems, and spinal surgery  SUBJECTIVE:                                                                                                                                                                                           SUBJECTIVE STATEMENT: Patient reports she was doing a lot of standing chores this morning and it caused some aching pain at her low back. She brought some shoe inserts her podiatrist suggested that helped her improve her heel pain. She states she suddenly loses her balance backwards when wearing the inserts and would like PT's opinion on them. She notes that last session she tried the Floor Transfer Test and was unable to get up from the floor without the help of two people because her legs got tangled and her knees  were just too painful to kneel on.   PAIN:  NPRS: Current: 1/10 aching across lower back after doing a lot of standing chores at home.   PRECAUTIONS: Fall  PATIENT GOALS: "stay on my feet" "be able to get up out of a chair" improve her core strength  OBJECTIVE  Vitals:   12/13/22 1447  BP: 133/81  Pulse: (!) 54  SpO2: 97%    TODAY'S  TREATMENT:     Therapeutic exercise: to centralize symptoms and improve ROM, strength, muscular endurance, and activity tolerance required for successful completion of functional activities.  - assessment of fit of shoe inserts (appear to fit well), advised to try using them less to help with balance and monitor heel pain. May have to return to them if heel pain starts to worsen).  - vitals check to to assess baseline (see above) - NuStep level 1 using bilateral upper and lower extremities. Seat/handle setting 8/10. For improved extremity mobility, muscular endurance, and activity tolerance; and to induce the analgesic effect of aerobic exercise, stimulate improved joint nutrition, and prepare body structures and systems for following interventions. x 7:30  minutes. Average SPM = 53. Cued to go at least 60 steps per minute.  - education on desensitization and scar massage to help improve her ability to kneel so she can garden and get up from the floor if she falls.  - standing facing chair partial kneeling with on knee at a time on chair seat with B UE support on chair arms to offload weight to make weight accepted on knee tolerable. 2x10 with 5 second holds.  - seated scar massage over both knees with education on purpose of it and how to perform.  - updated HEP to combine two prior HEP programs given and to provide more achievable exercise prescription for HEP.   Pt required multimodal cuing for proper technique and to facilitate improved neuromuscular control, strength, range of motion, and functional ability resulting in improved performance and  form.  PATIENT EDUCATION:  Education details: Exercise purpose/form. Self management techniques. Education on diagnosis, prognosis, POC, anatomy and physiology of current condition. Education on HEP including handout. Person educated: Patient Education method: Explanation, Demonstration, Tactile cues, Verbal cues, and Handouts Education comprehension: verbalized understanding, returned demonstration, and needs further education  HOME EXERCISE PROGRAM: Access Code: [email or web address] URL: [email or web address] Date: 12/13/2022 Prepared by: Norton Blizzard  Exercises - Sit to Stand Without Arm Support  - 3-5 x weekly - 3 sets - 5-10 reps - Standing March with Unilateral Counter Support  - 3-5 x weekly - 3 sets - 10 reps - Standing Knee Flexion with Unilateral Counter Support  - 3-5 x weekly - 3 sets - 10 reps - Standing Hip Abduction with Unilateral Counter Support  - 3-5 x weekly - 3 sets - 10 reps - Heel Toe Raises with Unilateral Counter Support  - 3-5 x weekly - 3 sets - 10 reps  Patient Education - Scar Massage  HOME EXERCISE PROGRAM [[email or web address]] View at [email or web address]" using code: [email or web address] Half Kneel on Padded Chair  -  Repeat 10 Repetitions, Hold 5 Seconds, Complete 2 Sets, Perform 1 Times a Day  ASSESSMENT:  CLINICAL IMPRESSION:  Patient arrives reporting mild back pain after doing household chores and concern over her shoe inserts causing backwards instability and difficulty getting up from the floor due to intolerable pain when kneeling after B TKA 6 years ago. Session focused on teaching patient about desensitization techniques including partial weight bearing on knees and scar massage. Updated HEP to improve her ability to work on desensitization. Plan to continue addressing balance and functional strength deficits next session as appropriate. Patient would benefit from continued management of limiting condition by skilled physical therapist to address remaining  impairments and functional limitations to work towards stated goals and return to PLOF or maximal functional independence.    From Initial Evaluation (11/30/2022):  Patient is a 82 y.o. female referred  to outpatient physical therapy with a medical diagnosis of right groin pain, acute bilateral low back pain without sciatica, left hip pain, weakness of both legs who presents with signs and symptoms consistent with generalized weakness and unsteadiness on feet with difficulty with prolonged standing due to low back pain who presents as a fall risk. Patient has experienced recent falls and difficulty getting up from the floor. During her exam she demonstrated posterior bias with difficulty performing 5 Times Sit To Stand without loss of balance backwards or using B UE. She also expresses intolerance to kneeling after having both knees replaced and would benefit from desensitization training to improve her ability to knee so she can garden and get up from the floor more easily. She also has significant loss of hip extension ROM, which likely affects her gait and standing tolerance. Patient is finding it challenging to care for herself and her mobility challenged husband due to her functional limitations and falls. Patient presents with significant pain, ROM, joint stiffness, gait, balance, muscle performance (strength/power/endurance), motor control, allodynia with kneeling, and activity tolerance impairments that are limiting ability to complete kneeling, getting up from the ground, not falling, caring for her husband, stairs, walking, getting up from sitting, bending, lifting, carrying, gardening, cooking, prolonged standing without difficulty. Patient will benefit from skilled physical therapy intervention to address current body structure impairments and activity limitations to improve function and work towards goals set in current POC in order to return to prior level of function or maximal functional  improvement.    OBJECTIVE IMPAIRMENTS: Abnormal gait, decreased activity tolerance, decreased balance, decreased endurance, decreased knowledge of condition, decreased knowledge of use of DME, decreased mobility, difficulty walking, decreased ROM, decreased strength, hypomobility, increased edema, impaired perceived functional ability, impaired flexibility, obesity, and pain.   ACTIVITY LIMITATIONS: carrying, lifting, bending, standing, squatting, stairs, transfers, bed mobility, dressing, locomotion level, and caring for others  PARTICIPATION LIMITATIONS: meal prep, cleaning, laundry, interpersonal relationship, shopping, community activity, yard work, and   difficulty with kneeling, getting up from the ground, not falling, caring for her husband, stairs, walking, getting up from sitting, bending, lifting, carrying, gardening, cooking, prolonged standing  PERSONAL FACTORS: Age, Fitness, Past/current experiences, Time since onset of injury/illness/exacerbation, and 3+ comorbidities:   plantar fasciitis, right foot pain (3 surgeries), osteopenia she has S/P total knee arthroplasty and Diverticulitis large intestine on their problem list. She  has a past medical history of Anemia, Anxiety, Arthritis, Cancer (HCC) (1999), GERD (gastroesophageal reflux disease), History of kidney stones, HOH (hard of hearing), Hypertension, Hypothyroidism, PONV (postoperative nausea and vomiting), and Tinnitus. She  has a past surgical history that includes Hemorroidectomy; Knee arthroscopy (Left); Liposuction; Blepharoplasty; Toe Surgery (Right); Varicose vein surgery; basil cell; Pelvic floor repair; Abdominal hysterectomy; Knee Arthroplasty (Left, 11/14/2016); Knee Arthroplasty (Right, 03/15/2017); total knee replacement (Bilateral, 11/2016); Cystocele repair (2005); Tonsillectomy; Shoulder arthroscopy with debridement and bicep tendon repair (Left, 09/24/2019); Cataract extraction w/PHACO (Left, 08/26/2020); and Cataract  extraction w/PHACO (Right, 09/09/2020) are also affecting patient's functional outcome.   REHAB POTENTIAL: Good  CLINICAL DECISION MAKING: Evolving/moderate complexity  EVALUATION COMPLEXITY: Moderate   GOALS: Goals reviewed with patient? No  SHORT TERM GOALS: Target date: 12/14/2022  Patient will be independent with initial home exercise program for self-management of symptoms. Baseline: Initial HEP provided at IE (11/30/22); Goal status: In-progress   LONG TERM GOALS: Target date: 02/22/2023  Patient will be independent with a long-term home exercise program for self-management of symptoms.  Baseline: Initial HEP to be  provided at visit 2 as appropriate (11/30/22); Goal status: In-progress  2.  Patient will demonstrate improved FOTO by equal or greater than 10 points by visit #10 to demonstrate improvement in overall condition and self-reported functional ability.  Baseline: 56 (11/30/22); Goal status: In-progress  3.  Patient will complete 5 Times Sit To Stand Test in equal or less than 15 seconds from 18.5 inch or less surface with no LOB or use of B UE to improve her ability to get around her home safely and carry items when getting up from a seated position.  Baseline: 23 seconds with frequent LOB backwards including leaning against plinth and one failed attempt. No use of B UE on the plinth. From 18.5 inch plinth (11/30/22); Goal status: In-progress  4.  Patient will demonstrate the ability to complete the Floor Transfer Test in equal or less than 8.8 seconds to improve her ability to get up from the ground when gardening or after a fall.   Baseline: to be tested at a later date (11/30/22); unable to complete floor transfer test without two person assistance (12/13/2022);  Goal status: In-progress  5.  Patient will complete community, work and/or recreational activities with 50% less  limitation due to current condition.  Baseline: difficulty with kneeling, getting up from the  ground, not falling, caring for her husband, stairs, walking, getting up from sitting, bending, lifting, carrying, gardening, cooking, prolonged standing (11/30/22); Goal status: In-progress  6.  Patient will score equal or greater than 25 on the Functional Gait Assessment to demonstrate low fall risk.  Baseline: to be tested at later date as appropriate (11/30/2022);  Goal status: In-progress   PLAN:  PT FREQUENCY: 1-2x/week  PT DURATION: 12 weeks  PLANNED INTERVENTIONS: Therapeutic exercises, Therapeutic activity, Neuromuscular re-education, Balance training, Gait training, Patient/Family education, Self Care, Joint mobilization, Stair training, DME instructions, Dry Needling, Cognitive remediation, Electrical stimulation, Spinal mobilization, Cryotherapy, Moist heat, Manual therapy, and Re-evaluation.  PLAN FOR NEXT SESSION: Update HEP as appropriate, progressive LE/core/functional strengthening and balance exercises. Education. Manual therapy as needed.    Cira Rue, PT,DPT 12/13/2022, 9:43 PM  [place] [facility] [address] [place], [place] [address] P: [phone] I F: [phone]

## 2022-12-15 ENCOUNTER — Ambulatory Visit: Payer: HMO | Admitting: Physical Therapy

## 2022-12-20 ENCOUNTER — Ambulatory Visit: Payer: HMO | Admitting: Physical Therapy

## 2022-12-20 DIAGNOSIS — Z9181 History of falling: Secondary | ICD-10-CM

## 2022-12-20 DIAGNOSIS — G8929 Other chronic pain: Secondary | ICD-10-CM

## 2022-12-20 DIAGNOSIS — R2689 Other abnormalities of gait and mobility: Secondary | ICD-10-CM

## 2022-12-20 DIAGNOSIS — M6281 Muscle weakness (generalized): Secondary | ICD-10-CM

## 2022-12-20 DIAGNOSIS — M5459 Other low back pain: Secondary | ICD-10-CM

## 2022-12-20 NOTE — Therapy (Signed)
OUTPATIENT PHYSICAL THERAPY TREATMENT   Patient Name: Mary Elliott MRN: [id number] DOB:04-19-1941, 82 y.o., female Today's Date: 12/20/2022  END OF SESSION:  PT End of Session - 12/20/22 1307     Visit Number 4    Number of Visits 13    Date for PT Re-Evaluation 02/22/23    Authorization Type [employer] reporting period from 11/30/2022    Progress Note Due on Visit 10    PT Start Time 1305    PT Stop Time 1343    PT Time Calculation (min) 38 min    Activity Tolerance Patient tolerated treatment well    Behavior During Therapy WFL for tasks assessed/performed                Past Medical History:  Diagnosis Date   Anemia    Anxiety    Arthritis    fingers   Cancer (HCC) 1999   basil cell on face   GERD (gastroesophageal reflux disease)    history of   History of kidney stones    twice   HOH (hard of hearing)    Hypertension    Hypothyroidism    PONV (postoperative nausea and vomiting)    nausea only with 2 prior surgeries   Tinnitus    Past Surgical History:  Procedure Laterality Date   ABDOMINAL HYSTERECTOMY     basil cell     face   BLEPHAROPLASTY     CATARACT EXTRACTION W/PHACO Left 08/26/2020   Procedure: CATARACT EXTRACTION PHACO AND INTRAOCULAR LENS PLACEMENT (IOC) LEFT PANOPTIX TORIC;  Surgeon: Lockie Mola, MD;  Location: [facility];  Service: Ophthalmology;  Laterality: Left;  8.88 1.:27.2 10.1%   CATARACT EXTRACTION W/PHACO Right 09/09/2020   Procedure: CATARACT EXTRACTION PHACO AND INTRAOCULAR LENS PLACEMENT (IOC) RIGHT PANOPTIX TORIC 8.54 01:03.3 13.5%;  Surgeon: Lockie Mola, MD;  Location: [facility];  Service: Ophthalmology;  Laterality: Right;   CYSTOCELE REPAIR  2005   HEMORROIDECTOMY     JOINT REPLACEMENT Bilateral 11/2016   KNEE ARTHROPLASTY Left 11/14/2016   Procedure: COMPUTER ASSISTED TOTAL KNEE ARTHROPLASTY;  Surgeon: Donato Heinz, MD;  Location: [facility];  Service: Orthopedics;   Laterality: Left;   KNEE ARTHROPLASTY Right 03/15/2017   Procedure: COMPUTER ASSISTED TOTAL KNEE ARTHROPLASTY;  Surgeon: Donato Heinz, MD;  Location: [facility];  Service: Orthopedics;  Laterality: Right;   KNEE ARTHROSCOPY Left    LIPOSUCTION     PELVIC FLOOR REPAIR     SHOULDER ARTHROSCOPY WITH DEBRIDEMENT AND BICEP TENDON REPAIR Left 09/24/2019   Procedure: Left shoulder arthroscopy with debridement, decompression, rotator cuff repair, and possible biceps tenodesis.;  Surgeon: Christena Flake, MD;  Location: [facility];  Service: Orthopedics;  Laterality: Left;   TOE SURGERY Right    TONSILLECTOMY     VARICOSE VEIN SURGERY     Patient Active Problem List   Diagnosis Date Noted   Diverticulitis large intestine 05/08/2022   S/P total knee arthroplasty 11/14/2016    PCP: Lynnea Ferrier, MD  REFERRING PROVIDER: Dayton Bailiff, PA  REFERRING DIAG: right groin pain, acute bilateral low back pain without sciatica, left hip pain, weakness of both legs  Rationale for Evaluation and Treatment: Rehabilitation  THERAPY DIAG:  Other low back pain  Other abnormalities of gait and mobility  Chronic pain of both knees  History of falling  Muscle weakness (generalized)  ONSET DATE: possibly chronic but current exacerbation started approximately January 2024  PERTINENT HISTORY:  Patient is  a 82 y.o. female who presents to outpatient physical therapy with a referral for medical diagnosis right groin pain, acute bilateral low back pain without sciatica, left hip pain, weakness of both legs. This patient's chief complaints consist of difficulty with mobility, unsteadiness on her feet, falls, low back pain with prolonged standing, and anterior knee pain with kneeling, leading to the following functional deficits: difficulty with kneeling, getting up from the ground, not falling, caring for her husband, stairs, walking, getting up from sitting, bending, lifting, carrying, gardening, cooking,  prolonged standing. Relevant past medical history and comorbidities include plantar fasciitis, right foot pain (3 surgeries), osteopenia she has S/P total knee arthroplasty and Diverticulitis large intestine on their problem list. She  has a past medical history of Anemia, Anxiety, Arthritis, Cancer (HCC) (1999), GERD (gastroesophageal reflux disease), History of kidney stones, HOH (hard of hearing), Hypertension, Hypothyroidism, PONV (postoperative nausea and vomiting), and Tinnitus. She  has a past surgical history that includes Hemorroidectomy; Knee arthroscopy (Left); Liposuction; Blepharoplasty; Toe Surgery (Right); Varicose vein surgery; basil cell; Pelvic floor repair; Abdominal hysterectomy; Knee Arthroplasty (Left, 11/14/2016); Knee Arthroplasty (Right, 03/15/2017); total knee replacement (Bilateral, 11/2016); Cystocele repair (2005); Tonsillectomy; Shoulder arthroscopy with debridement and bicep tendon repair (Left, 09/24/2019); Cataract extraction w/PHACO (Left, 08/26/2020); and Cataract extraction w/PHACO (Right, 09/09/2020). Patient denies hx of stroke, seizures, lung problems, heart problems, diabetes, unexplained weight loss, unexplained changes in bowel or bladder problems, and spinal surgery  SUBJECTIVE:                                                                                                                                                                                           SUBJECTIVE STATEMENT: Patient reports she is feeling well today. She has been working on scar massage and desensitization at home and found that she feels better quickly  on the right side but she found a spot on her left knee next to the scar that was a lot more tender.   PAIN:  NPRS: 0/10 .   PRECAUTIONS: Fall  PATIENT GOALS: "stay on my feet" "be able to get up out of a chair" improve her core strength  OBJECTIVE  TODAY'S TREATMENT:     Therapeutic exercise: to centralize symptoms and improve ROM,  strength, muscular endurance, and activity tolerance required for successful completion of functional activities.  - NuStep level 2 using bilateral upper and lower extremities. Seat/handle setting 8/10. For improved extremity mobility, muscular endurance, and activity tolerance; and to induce the analgesic effect of aerobic exercise, stimulate improved joint nutrition, and prepare body structures and systems for following interventions. x 5  minutes. Average SPM =  64. Cued to go at least 60 steps per minute.  - sit <> stand with TRX from 17 inch chair, 1x10 - sit <> stand with TRX from from 12 inch step, 3x10 - lateral stepping back and forth over small 6 inch aerobic step with B UE support, 3x10. Looking at feet to help see how far to step.  - double leg hop or hop prep with B UE support on TM bar, 2x10 plus a few more activities.  - bouncing on mini-trampoline,1x20 reps.  (Patient reports no leaking with bouncing/jumping - she does report urinary leaking with other triggers such as cold on her hands) - tall kneeling and return to/from aire+/a2z pad, lateral shifting in tall kneeling 1x20 each direction, B UE support on armed chair. SBA.   Pt required multimodal cuing for proper technique and to facilitate improved neuromuscular control, strength, range of motion, and functional ability resulting in improved performance and form.  PATIENT EDUCATION:  Education details: Exercise purpose/form. Self management techniques. Education on diagnosis, prognosis, POC, anatomy and physiology of current condition. Education on HEP including handout. Person educated: Patient Education method: Explanation, Demonstration, Tactile cues, Verbal cues, and Handouts Education comprehension: verbalized understanding, returned demonstration, and needs further education  HOME EXERCISE PROGRAM: Access Code: [email or web address] URL: [email or web address] Date: 12/13/2022 Prepared by: Norton Blizzard  Exercises - Sit  to Stand Without Arm Support  - 3-5 x weekly - 3 sets - 5-10 reps - Standing March with Unilateral Counter Support  - 3-5 x weekly - 3 sets - 10 reps - Standing Knee Flexion with Unilateral Counter Support  - 3-5 x weekly - 3 sets - 10 reps - Standing Hip Abduction with Unilateral Counter Support  - 3-5 x weekly - 3 sets - 10 reps - Heel Toe Raises with Unilateral Counter Support  - 3-5 x weekly - 3 sets - 10 reps  Patient Education - Scar Massage  HOME EXERCISE PROGRAM [[email or web address]] View at [email or web address]" using code: [email or web address] Half Kneel on Padded Chair  -  Repeat 10 Repetitions, Hold 5 Seconds, Complete 2 Sets, Perform 1 Times a Day  ASSESSMENT:  CLINICAL IMPRESSION:  Patient arrives reporting no pain and good participation in HEP. Today's session included progressions in exercises for B LE strengthening and kneeling desensitization. Patient tolerated treatment well with no increase in pain during session. Educated on likely DOMS due to new exercises and the intensity of exercises. She was able to tolerate tall kneeling on airex pad + a2z pad and get up from tall kneeling using B UE support on chair with significant effort. Patient would benefit from continued management of limiting condition by skilled physical therapist to address remaining impairments and functional limitations to work towards stated goals and return to PLOF or maximal functional independence.    From Initial Evaluation (11/30/2022):  Patient is a 82 y.o. female referred to outpatient physical therapy with a medical diagnosis of right groin pain, acute bilateral low back pain without sciatica, left hip pain, weakness of both legs who presents with signs and symptoms consistent with generalized weakness and unsteadiness on feet with difficulty with prolonged standing due to low back pain who presents as a fall risk. Patient has experienced recent falls and difficulty getting up from the floor. During her exam she demonstrated  posterior bias with difficulty performing 5 Times Sit To Stand without loss of balance backwards or using B UE. She also expresses intolerance to kneeling after having both knees replaced and would benefit from desensitization training  to improve her ability to knee so she can garden and get up from the floor more easily. She also has significant loss of hip extension ROM, which likely affects her gait and standing tolerance. Patient is finding it challenging to care for herself and her mobility challenged husband due to her functional limitations and falls. Patient presents with significant pain, ROM, joint stiffness, gait, balance, muscle performance (strength/power/endurance), motor control, allodynia with kneeling, and activity tolerance impairments that are limiting ability to complete kneeling, getting up from the ground, not falling, caring for her husband, stairs, walking, getting up from sitting, bending, lifting, carrying, gardening, cooking, prolonged standing without difficulty. Patient will benefit from skilled physical therapy intervention to address current body structure impairments and activity limitations to improve function and work towards goals set in current POC in order to return to prior level of function or maximal functional improvement.    OBJECTIVE IMPAIRMENTS: Abnormal gait, decreased activity tolerance, decreased balance, decreased endurance, decreased knowledge of condition, decreased knowledge of use of DME, decreased mobility, difficulty walking, decreased ROM, decreased strength, hypomobility, increased edema, impaired perceived functional ability, impaired flexibility, obesity, and pain.   ACTIVITY LIMITATIONS: carrying, lifting, bending, standing, squatting, stairs, transfers, bed mobility, dressing, locomotion level, and caring for others  PARTICIPATION LIMITATIONS: meal prep, cleaning, laundry, interpersonal relationship, shopping, community activity, yard work, and    difficulty with kneeling, getting up from the ground, not falling, caring for her husband, stairs, walking, getting up from sitting, bending, lifting, carrying, gardening, cooking, prolonged standing  PERSONAL FACTORS: Age, Fitness, Past/current experiences, Time since onset of injury/illness/exacerbation, and 3+ comorbidities:   plantar fasciitis, right foot pain (3 surgeries), osteopenia she has S/P total knee arthroplasty and Diverticulitis large intestine on their problem list. She  has a past medical history of Anemia, Anxiety, Arthritis, Cancer (HCC) (1999), GERD (gastroesophageal reflux disease), History of kidney stones, HOH (hard of hearing), Hypertension, Hypothyroidism, PONV (postoperative nausea and vomiting), and Tinnitus. She  has a past surgical history that includes Hemorroidectomy; Knee arthroscopy (Left); Liposuction; Blepharoplasty; Toe Surgery (Right); Varicose vein surgery; basil cell; Pelvic floor repair; Abdominal hysterectomy; Knee Arthroplasty (Left, 11/14/2016); Knee Arthroplasty (Right, 03/15/2017); total knee replacement (Bilateral, 11/2016); Cystocele repair (2005); Tonsillectomy; Shoulder arthroscopy with debridement and bicep tendon repair (Left, 09/24/2019); Cataract extraction w/PHACO (Left, 08/26/2020); and Cataract extraction w/PHACO (Right, 09/09/2020) are also affecting patient's functional outcome.   REHAB POTENTIAL: Good  CLINICAL DECISION MAKING: Evolving/moderate complexity  EVALUATION COMPLEXITY: Moderate   GOALS: Goals reviewed with patient? No  SHORT TERM GOALS: Target date: 12/14/2022  Patient will be independent with initial home exercise program for self-management of symptoms. Baseline: Initial HEP provided at IE (11/30/22); Goal status: In-progress   LONG TERM GOALS: Target date: 02/22/2023  Patient will be independent with a long-term home exercise program for self-management of symptoms.  Baseline: Initial HEP to be provided at visit 2 as appropriate  (11/30/22); Goal status: In-progress  2.  Patient will demonstrate improved FOTO by equal or greater than 10 points by visit #10 to demonstrate improvement in overall condition and self-reported functional ability.  Baseline: 56 (11/30/22); Goal status: In-progress  3.  Patient will complete 5 Times Sit To Stand Test in equal or less than 15 seconds from 18.5 inch or less surface with no LOB or use of B UE to improve her ability to get around her home safely and carry items when getting up from a seated position.  Baseline: 23 seconds with frequent LOB backwards including leaning against  plinth and one failed attempt. No use of B UE on the plinth. From 18.5 inch plinth (11/30/22); Goal status: In-progress  4.  Patient will demonstrate the ability to complete the Floor Transfer Test in equal or less than 8.8 seconds to improve her ability to get up from the ground when gardening or after a fall.   Baseline: to be tested at a later date (11/30/22); unable to complete floor transfer test without two person assistance (12/13/2022);  Goal status: In-progress  5.  Patient will complete community, work and/or recreational activities with 50% less  limitation due to current condition.  Baseline: difficulty with kneeling, getting up from the ground, not falling, caring for her husband, stairs, walking, getting up from sitting, bending, lifting, carrying, gardening, cooking, prolonged standing (11/30/22); Goal status: In-progress  6.  Patient will score equal or greater than 25 on the Functional Gait Assessment to demonstrate low fall risk.  Baseline: to be tested at later date as appropriate (11/30/2022);  Goal status: In-progress   PLAN:  PT FREQUENCY: 1-2x/week  PT DURATION: 12 weeks  PLANNED INTERVENTIONS: Therapeutic exercises, Therapeutic activity, Neuromuscular re-education, Balance training, Gait training, Patient/Family education, Self Care, Joint mobilization, Stair training, DME  instructions, Dry Needling, Cognitive remediation, Electrical stimulation, Spinal mobilization, Cryotherapy, Moist heat, Manual therapy, and Re-evaluation.  PLAN FOR NEXT SESSION: Update HEP as appropriate, progressive LE/core/functional strengthening and balance exercises. Education. Manual therapy as needed.    Cira Rue, PT,DPT 12/20/2022, 1:58 PM  [facility] [address] [place], [place] [address] P: [phone] I F: [phone]

## 2022-12-22 ENCOUNTER — Ambulatory Visit: Payer: HMO | Admitting: Physical Therapy

## 2022-12-22 ENCOUNTER — Encounter: Payer: Self-pay | Admitting: Physical Therapy

## 2022-12-22 DIAGNOSIS — G8929 Other chronic pain: Secondary | ICD-10-CM

## 2022-12-22 DIAGNOSIS — M5459 Other low back pain: Secondary | ICD-10-CM | POA: Diagnosis not present

## 2022-12-22 DIAGNOSIS — M6281 Muscle weakness (generalized): Secondary | ICD-10-CM

## 2022-12-22 DIAGNOSIS — Z9181 History of falling: Secondary | ICD-10-CM

## 2022-12-22 DIAGNOSIS — R2689 Other abnormalities of gait and mobility: Secondary | ICD-10-CM

## 2022-12-22 NOTE — Therapy (Signed)
OUTPATIENT PHYSICAL THERAPY TREATMENT   Patient Name: Mary Elliott MRN: [id number] DOB:1940/05/25, 82 y.o., female Today's Date: 12/22/2022  END OF SESSION:  PT End of Session - 12/22/22 1908     Visit Number 5    Number of Visits 13    Date for PT Re-Evaluation 02/22/23    Authorization Type [employer] reporting period from 11/30/2022    Progress Note Due on Visit 10    PT Start Time 1817    PT Stop Time 1900    PT Time Calculation (min) 43 min    Activity Tolerance Patient tolerated treatment well    Behavior During Therapy [facility] for tasks assessed/performed                Past Medical History:  Diagnosis Date   Anemia    Anxiety    Arthritis    fingers   Cancer (HCC) 1999   basil cell on face   GERD (gastroesophageal reflux disease)    history of   History of kidney stones    twice   HOH (hard of hearing)    Hypertension    Hypothyroidism    PONV (postoperative nausea and vomiting)    nausea only with 2 prior surgeries   Tinnitus    Past Surgical History:  Procedure Laterality Date   ABDOMINAL HYSTERECTOMY     basil cell     face   BLEPHAROPLASTY     CATARACT EXTRACTION W/PHACO Left 08/26/2020   Procedure: CATARACT EXTRACTION PHACO AND INTRAOCULAR LENS PLACEMENT (IOC) LEFT PANOPTIX TORIC;  Surgeon: Lockie Mola, MD;  Location: [facility];  Service: Ophthalmology;  Laterality: Left;  8.88 1.:27.2 10.1%   CATARACT EXTRACTION W/PHACO Right 09/09/2020   Procedure: CATARACT EXTRACTION PHACO AND INTRAOCULAR LENS PLACEMENT (IOC) RIGHT PANOPTIX TORIC 8.54 01:03.3 13.5%;  Surgeon: Lockie Mola, MD;  Location: [facility];  Service: Ophthalmology;  Laterality: Right;   CYSTOCELE REPAIR  2005   HEMORROIDECTOMY     JOINT REPLACEMENT Bilateral 11/2016   KNEE ARTHROPLASTY Left 11/14/2016   Procedure: COMPUTER ASSISTED TOTAL KNEE ARTHROPLASTY;  Surgeon: Donato Heinz, MD;  Location: [facility];  Service: Orthopedics;   Laterality: Left;   KNEE ARTHROPLASTY Right 03/15/2017   Procedure: COMPUTER ASSISTED TOTAL KNEE ARTHROPLASTY;  Surgeon: Donato Heinz, MD;  Location: [facility];  Service: Orthopedics;  Laterality: Right;   KNEE ARTHROSCOPY Left    LIPOSUCTION     PELVIC FLOOR REPAIR     SHOULDER ARTHROSCOPY WITH DEBRIDEMENT AND BICEP TENDON REPAIR Left 09/24/2019   Procedure: Left shoulder arthroscopy with debridement, decompression, rotator cuff repair, and possible biceps tenodesis.;  Surgeon: Christena Flake, MD;  Location: [facility];  Service: Orthopedics;  Laterality: Left;   TOE SURGERY Right    TONSILLECTOMY     VARICOSE VEIN SURGERY     Patient Active Problem List   Diagnosis Date Noted   Diverticulitis large intestine 05/08/2022   S/P total knee arthroplasty 11/14/2016    PCP: Lynnea Ferrier, MD  REFERRING PROVIDER: Dayton Bailiff, PA  REFERRING DIAG: right groin pain, acute bilateral low back pain without sciatica, left hip pain, weakness of both legs  Rationale for Evaluation and Treatment: Rehabilitation  THERAPY DIAG:  Other low back pain  Other abnormalities of gait and mobility  Chronic pain of both knees  History of falling  Muscle weakness (generalized)  ONSET DATE: possibly chronic but current exacerbation started approximately January 2024  PERTINENT HISTORY:  Patient is  a 82 y.o. female who presents to outpatient physical therapy with a referral for medical diagnosis right groin pain, acute bilateral low back pain without sciatica, left hip pain, weakness of both legs. This patient's chief complaints consist of difficulty with mobility, unsteadiness on her feet, falls, low back pain with prolonged standing, and anterior knee pain with kneeling, leading to the following functional deficits: difficulty with kneeling, getting up from the ground, not falling, caring for her husband, stairs, walking, getting up from sitting, bending, lifting, carrying, gardening, cooking,  prolonged standing. Relevant past medical history and comorbidities include plantar fasciitis, right foot pain (3 surgeries), osteopenia she has S/P total knee arthroplasty and Diverticulitis large intestine on their problem list. She  has a past medical history of Anemia, Anxiety, Arthritis, Cancer (HCC) (1999), GERD (gastroesophageal reflux disease), History of kidney stones, HOH (hard of hearing), Hypertension, Hypothyroidism, PONV (postoperative nausea and vomiting), and Tinnitus. She  has a past surgical history that includes Hemorroidectomy; Knee arthroscopy (Left); Liposuction; Blepharoplasty; Toe Surgery (Right); Varicose vein surgery; basil cell; Pelvic floor repair; Abdominal hysterectomy; Knee Arthroplasty (Left, 11/14/2016); Knee Arthroplasty (Right, 03/15/2017); total knee replacement (Bilateral, 11/2016); Cystocele repair (2005); Tonsillectomy; Shoulder arthroscopy with debridement and bicep tendon repair (Left, 09/24/2019); Cataract extraction w/PHACO (Left, 08/26/2020); and Cataract extraction w/PHACO (Right, 09/09/2020). Patient denies hx of stroke, seizures, lung problems, heart problems, diabetes, unexplained weight loss, unexplained changes in bowel or bladder problems, and spinal surgery  SUBJECTIVE:                                                                                                                                                                                           SUBJECTIVE STATEMENT: Patient reports she is feeling well this evening. She had a little swelling in her left anterior forearm near the antecubital fossa after last PT session and she was concerned that she may have hurt the tendon the surgeon addressed during her shoulder surgery while she was pulling on the TRX straps last PT session.   Review of medical chart shows patient under went the following procedure, performed by Dr. Joice Lofts on 09/24/2019: Limited arthroscopic debridement, arthroscopic subacromial  decompression, mini-open rotator cuff repair using [employer] Regeneten patch, and mini-open biceps tenodesis, left shoulder.   PAIN:  NPRS: 0/10  PRECAUTIONS: Fall  PATIENT GOALS: "stay on my feet" "be able to get up out of a chair" improve her core strength  OBJECTIVE  TODAY'S TREATMENT:     Therapeutic exercise: to centralize symptoms and improve ROM, strength, muscular endurance, and activity tolerance required for successful completion of functional activities.  - NuStep level 2 using bilateral  upper and lower extremities. Seat/handle setting 8/10. For improved extremity mobility, muscular endurance, and activity tolerance; and to induce the analgesic effect of aerobic exercise, stimulate improved joint nutrition, and prepare body structures and systems for following interventions. x 8:46  minutes. Average SPM = 61. Cued to go at least 60 steps per minute.  - sit <> stand with TRX from from 12 inch step, 3x12  Circuit:  - lateral stepping back and forth over small 6 inch aerobic step with B UE support, 3x10. Looking at feet intermittently to help see how far to step.  - double leg hop or hop prep with B UE support on TM bar, 3x10.  - tall kneeling <> stand  to/from airex/a2z pad,  B UE support on armed chair. 1x3 each foot leading. SBA.   Pt required multimodal cuing for proper technique and to facilitate improved neuromuscular control, strength, range of motion, and functional ability resulting in improved performance and form.  PATIENT EDUCATION:  Education details: Exercise purpose/form. Self management techniques. Education on diagnosis, prognosis, POC, anatomy and physiology of current condition. Education on HEP including handout. Person educated: Patient Education method: Explanation, Demonstration, Tactile cues, Verbal cues, and Handouts Education comprehension: verbalized understanding, returned demonstration, and needs further education  HOME EXERCISE  PROGRAM: Access Code: [email or web address] URL: [email or web address] Date: 12/13/2022 Prepared by: Norton Blizzard  Exercises - Sit to Stand Without Arm Support  - 3-5 x weekly - 3 sets - 5-10 reps - Standing March with Unilateral Counter Support  - 3-5 x weekly - 3 sets - 10 reps - Standing Knee Flexion with Unilateral Counter Support  - 3-5 x weekly - 3 sets - 10 reps - Standing Hip Abduction with Unilateral Counter Support  - 3-5 x weekly - 3 sets - 10 reps - Heel Toe Raises with Unilateral Counter Support  - 3-5 x weekly - 3 sets - 10 reps  Patient Education - Scar Massage  HOME EXERCISE PROGRAM [[email or web address]] View at "[email or web address]" using code: [email or web address] Half Kneel on Padded Chair  -  Repeat 10 Repetitions, Hold 5 Seconds, Complete 2 Sets, Perform 1 Times a Day  ASSESSMENT:  CLINICAL IMPRESSION:  Patient arrives reporting good tolerance to last PT session and HEP. Today's session focused on progressing strengthening, power, and desensitization exercises from last session. Patient tolerated well with improved coordination and confidence with repetition, most noticeable with hopping, lateral steeping, and tall kneeling transfers. Patient continues to struggle with strength and sensitivity over her anterior knees with kneeling exercise and balance with lateral stepping exercise. Patient would benefit from continued management of limiting condition by skilled physical therapist to address remaining impairments and functional limitations to work towards stated goals and return to PLOF or maximal functional independence.    From Initial Evaluation (11/30/2022):  Patient is a 82 y.o. female referred to outpatient physical therapy with a medical diagnosis of right groin pain, acute bilateral low back pain without sciatica, left hip pain, weakness of both legs who presents with signs and symptoms consistent with generalized weakness and unsteadiness on feet with difficulty with prolonged standing  due to low back pain who presents as a fall risk. Patient has experienced recent falls and difficulty getting up from the floor. During her exam she demonstrated posterior bias with difficulty performing 5 Times Sit To Stand without loss of balance backwards or using B UE. She also expresses intolerance to kneeling after having both knees replaced and would benefit from desensitization training to improve her ability to knee  so she can garden and get up from the floor more easily. She also has significant loss of hip extension ROM, which likely affects her gait and standing tolerance. Patient is finding it challenging to care for herself and her mobility challenged husband due to her functional limitations and falls. Patient presents with significant pain, ROM, joint stiffness, gait, balance, muscle performance (strength/power/endurance), motor control, allodynia with kneeling, and activity tolerance impairments that are limiting ability to complete kneeling, getting up from the ground, not falling, caring for her husband, stairs, walking, getting up from sitting, bending, lifting, carrying, gardening, cooking, prolonged standing without difficulty. Patient will benefit from skilled physical therapy intervention to address current body structure impairments and activity limitations to improve function and work towards goals set in current POC in order to return to prior level of function or maximal functional improvement.    OBJECTIVE IMPAIRMENTS: Abnormal gait, decreased activity tolerance, decreased balance, decreased endurance, decreased knowledge of condition, decreased knowledge of use of DME, decreased mobility, difficulty walking, decreased ROM, decreased strength, hypomobility, increased edema, impaired perceived functional ability, impaired flexibility, obesity, and pain.   ACTIVITY LIMITATIONS: carrying, lifting, bending, standing, squatting, stairs, transfers, bed mobility, dressing, locomotion  level, and caring for others  PARTICIPATION LIMITATIONS: meal prep, cleaning, laundry, interpersonal relationship, shopping, community activity, yard work, and   difficulty with kneeling, getting up from the ground, not falling, caring for her husband, stairs, walking, getting up from sitting, bending, lifting, carrying, gardening, cooking, prolonged standing  PERSONAL FACTORS: Age, Fitness, Past/current experiences, Time since onset of injury/illness/exacerbation, and 3+ comorbidities:   plantar fasciitis, right foot pain (3 surgeries), osteopenia she has S/P total knee arthroplasty and Diverticulitis large intestine on their problem list. She  has a past medical history of Anemia, Anxiety, Arthritis, Cancer (HCC) (1999), GERD (gastroesophageal reflux disease), History of kidney stones, HOH (hard of hearing), Hypertension, Hypothyroidism, PONV (postoperative nausea and vomiting), and Tinnitus. She  has a past surgical history that includes Hemorroidectomy; Knee arthroscopy (Left); Liposuction; Blepharoplasty; Toe Surgery (Right); Varicose vein surgery; basil cell; Pelvic floor repair; Abdominal hysterectomy; Knee Arthroplasty (Left, 11/14/2016); Knee Arthroplasty (Right, 03/15/2017); total knee replacement (Bilateral, 11/2016); Cystocele repair (2005); Tonsillectomy; Shoulder arthroscopy with debridement and bicep tendon repair (Left, 09/24/2019); Cataract extraction w/PHACO (Left, 08/26/2020); and Cataract extraction w/PHACO (Right, 09/09/2020) are also affecting patient's functional outcome.   REHAB POTENTIAL: Good  CLINICAL DECISION MAKING: Evolving/moderate complexity  EVALUATION COMPLEXITY: Moderate   GOALS: Goals reviewed with patient? No  SHORT TERM GOALS: Target date: 12/14/2022  Patient will be independent with initial home exercise program for self-management of symptoms. Baseline: Initial HEP provided at IE (11/30/22); Goal status: In-progress   LONG TERM GOALS: Target date:  02/22/2023  Patient will be independent with a long-term home exercise program for self-management of symptoms.  Baseline: Initial HEP to be provided at visit 2 as appropriate (11/30/22); Goal status: In-progress  2.  Patient will demonstrate improved FOTO by equal or greater than 10 points by visit #10 to demonstrate improvement in overall condition and self-reported functional ability.  Baseline: 56 (11/30/22); Goal status: In-progress  3.  Patient will complete 5 Times Sit To Stand Test in equal or less than 15 seconds from 18.5 inch or less surface with no LOB or use of B UE to improve her ability to get around her home safely and carry items when getting up from a seated position.  Baseline: 23 seconds with frequent LOB backwards including leaning against plinth and one failed attempt. No  use of B UE on the plinth. From 18.5 inch plinth (11/30/22); Goal status: In-progress  4.  Patient will demonstrate the ability to complete the Floor Transfer Test in equal or less than 8.8 seconds to improve her ability to get up from the ground when gardening or after a fall.   Baseline: to be tested at a later date (11/30/22); unable to complete floor transfer test without two person assistance (12/13/2022);  Goal status: In-progress  5.  Patient will complete community, work and/or recreational activities with 50% less  limitation due to current condition.  Baseline: difficulty with kneeling, getting up from the ground, not falling, caring for her husband, stairs, walking, getting up from sitting, bending, lifting, carrying, gardening, cooking, prolonged standing (11/30/22); Goal status: In-progress  6.  Patient will score equal or greater than 25 on the Functional Gait Assessment to demonstrate low fall risk.  Baseline: to be tested at later date as appropriate (11/30/2022);  Goal status: In-progress   PLAN:  PT FREQUENCY: 1-2x/week  PT DURATION: 12 weeks  PLANNED INTERVENTIONS: Therapeutic  exercises, Therapeutic activity, Neuromuscular re-education, Balance training, Gait training, Patient/Family education, Self Care, Joint mobilization, Stair training, DME instructions, Dry Needling, Cognitive remediation, Electrical stimulation, Spinal mobilization, Cryotherapy, Moist heat, Manual therapy, and Re-evaluation.  PLAN FOR NEXT SESSION: Update HEP as appropriate, progressive LE/core/functional strengthening and balance exercises. Education. Manual therapy as needed.    Cira Rue, PT,DPT 12/22/2022, 7:10 PM  [facility] [address] [place], [place] [address] P: [phone] I F: [phone]

## 2022-12-26 ENCOUNTER — Encounter: Payer: HMO | Admitting: Physical Therapy

## 2022-12-27 ENCOUNTER — Ambulatory Visit: Payer: HMO | Admitting: Physical Therapy

## 2022-12-27 ENCOUNTER — Encounter: Payer: Self-pay | Admitting: Physical Therapy

## 2022-12-27 DIAGNOSIS — R2689 Other abnormalities of gait and mobility: Secondary | ICD-10-CM

## 2022-12-27 DIAGNOSIS — M5459 Other low back pain: Secondary | ICD-10-CM | POA: Diagnosis not present

## 2022-12-27 DIAGNOSIS — Z9181 History of falling: Secondary | ICD-10-CM

## 2022-12-27 DIAGNOSIS — M6281 Muscle weakness (generalized): Secondary | ICD-10-CM

## 2022-12-27 DIAGNOSIS — G8929 Other chronic pain: Secondary | ICD-10-CM

## 2022-12-27 NOTE — Therapy (Signed)
OUTPATIENT PHYSICAL THERAPY TREATMENT   Patient Name: Mary Elliott MRN: [id number] DOB:[birth year], 82 y.o., female Today's Date: 12/27/2022  END OF SESSION:  PT End of Session - 12/27/22 1354     Visit Number 6    Number of Visits 13    Date for PT Re-Evaluation 02/22/23    Authorization Type [employer] reporting period from 11/30/2022    Progress Note Due on Visit 10    PT Start Time 1351    PT Stop Time 1429    PT Time Calculation (min) 38 min    Activity Tolerance Patient tolerated treatment well    Behavior During Therapy WFL for tasks assessed/performed                 Past Medical History:  Diagnosis Date   Anemia    Anxiety    Arthritis    fingers   Cancer (HCC) 1999   basil cell on face   GERD (gastroesophageal reflux disease)    history of   History of kidney stones    twice   HOH (hard of hearing)    Hypertension    Hypothyroidism    PONV (postoperative nausea and vomiting)    nausea only with 2 prior surgeries   Tinnitus    Past Surgical History:  Procedure Laterality Date   ABDOMINAL HYSTERECTOMY     basil cell     face   BLEPHAROPLASTY     CATARACT EXTRACTION W/PHACO Left 08/26/2020   Procedure: CATARACT EXTRACTION PHACO AND INTRAOCULAR LENS PLACEMENT (IOC) LEFT PANOPTIX TORIC;  Surgeon: Lockie Mola, MD;  Location: [facility];  Service: Ophthalmology;  Laterality: Left;  8.88 1.:27.2 10.1%   CATARACT EXTRACTION W/PHACO Right 09/09/2020   Procedure: CATARACT EXTRACTION PHACO AND INTRAOCULAR LENS PLACEMENT (IOC) RIGHT PANOPTIX TORIC 8.54 01:03.3 13.5%;  Surgeon: Lockie Mola, MD;  Location: [facility];  Service: Ophthalmology;  Laterality: Right;   CYSTOCELE REPAIR  2005   HEMORROIDECTOMY     JOINT REPLACEMENT Bilateral 11/2016   KNEE ARTHROPLASTY Left 11/14/2016   Procedure: COMPUTER ASSISTED TOTAL KNEE ARTHROPLASTY;  Surgeon: Donato Heinz, MD;  Location: [facility];  Service: Orthopedics;   Laterality: Left;   KNEE ARTHROPLASTY Right 03/15/2017   Procedure: COMPUTER ASSISTED TOTAL KNEE ARTHROPLASTY;  Surgeon: Donato Heinz, MD;  Location: [facility];  Service: Orthopedics;  Laterality: Right;   KNEE ARTHROSCOPY Left    LIPOSUCTION     PELVIC FLOOR REPAIR     SHOULDER ARTHROSCOPY WITH DEBRIDEMENT AND BICEP TENDON REPAIR Left 09/24/2019   Procedure: Left shoulder arthroscopy with debridement, decompression, rotator cuff repair, and possible biceps tenodesis.;  Surgeon: Christena Flake, MD;  Location: [facility];  Service: Orthopedics;  Laterality: Left;   TOE SURGERY Right    TONSILLECTOMY     VARICOSE VEIN SURGERY     Patient Active Problem List   Diagnosis Date Noted   Diverticulitis large intestine 05/08/2022   S/P total knee arthroplasty 11/14/2016    PCP: Lynnea Ferrier, MD  REFERRING PROVIDER: Dayton Bailiff, PA  REFERRING DIAG: right groin pain, acute bilateral low back pain without sciatica, left hip pain, weakness of both legs  Rationale for Evaluation and Treatment: Rehabilitation  THERAPY DIAG:  Other low back pain  Other abnormalities of gait and mobility  Chronic pain of both knees  History of falling  Muscle weakness (generalized)  ONSET DATE: possibly chronic but current exacerbation started approximately January 2024  PERTINENT HISTORY:  Patient  is a 82 y.o. female who presents to outpatient physical therapy with a referral for medical diagnosis right groin pain, acute bilateral low back pain without sciatica, left hip pain, weakness of both legs. This patient's chief complaints consist of difficulty with mobility, unsteadiness on her feet, falls, low back pain with prolonged standing, and anterior knee pain with kneeling, leading to the following functional deficits: difficulty with kneeling, getting up from the ground, not falling, caring for her husband, stairs, walking, getting up from sitting, bending, lifting, carrying, gardening, cooking,  prolonged standing. Relevant past medical history and comorbidities include plantar fasciitis, right foot pain (3 surgeries), osteopenia she has S/P total knee arthroplasty and Diverticulitis large intestine on their problem list. She  has a past medical history of Anemia, Anxiety, Arthritis, Cancer (HCC) (1999), GERD (gastroesophageal reflux disease), History of kidney stones, HOH (hard of hearing), Hypertension, Hypothyroidism, PONV (postoperative nausea and vomiting), and Tinnitus. She  has a past surgical history that includes Hemorroidectomy; Knee arthroscopy (Left); Liposuction; Blepharoplasty; Toe Surgery (Right); Varicose vein surgery; basil cell; Pelvic floor repair; Abdominal hysterectomy; Knee Arthroplasty (Left, 11/14/2016); Knee Arthroplasty (Right, 03/15/2017); total knee replacement (Bilateral, 11/2016); Cystocele repair (2005); Tonsillectomy; Shoulder arthroscopy with debridement and bicep tendon repair (Left, 09/24/2019); Cataract extraction w/PHACO (Left, 08/26/2020); and Cataract extraction w/PHACO (Right, 09/09/2020). Patient denies hx of stroke, seizures, lung problems, heart problems, diabetes, unexplained weight loss, unexplained changes in bowel or bladder problems, and spinal surgery  SUBJECTIVE:                                                                                                                                                                                           SUBJECTIVE STATEMENT: Patient states she is working on feeling calm and "everything will be okay" when she arrives. She states she is not used to wearing leggings in public, which she wore today, so she is self-conscious. She denies soreness after last PT session.   Review of medical chart shows patient under went the following procedure, performed by Dr. Joice Lofts on 09/24/2019: Limited arthroscopic debridement, arthroscopic subacromial decompression, mini-open rotator cuff repair using [employer] Regeneten patch,  and mini-open biceps tenodesis, left shoulder.   PAIN:  NPRS: 0/10   PRECAUTIONS: Fall  PATIENT GOALS: "stay on my feet" "be able to get up out of a chair" improve her core strength  OBJECTIVE  TODAY'S TREATMENT:     Therapeutic exercise: to centralize symptoms and improve ROM, strength, muscular endurance, and activity tolerance required for successful completion of functional activities.  - NuStep level 4 using bilateral upper and lower extremities. Seat/handle setting 8/10. For improved extremity mobility,  muscular endurance, and activity tolerance; and to induce the analgesic effect of aerobic exercise, stimulate improved joint nutrition, and prepare body structures and systems for following interventions. x 5  minutes. Average SPM = 68. Cued to go at least 60 steps per minute.  - sit <> stand with TRX from from 12 inch step, 3x15 (educated on RPE/RIR and patient shooting for above 6/10). Last rep added BlackTB loop around hips to floor (with PT standing on them). Limited by whole body fatigue. - TOTAL gym squats to 90 or lower, 3x10 at level 19/21/23. RPE 6-7? - double leg hop or hop prep with B UE support on TM bar, 3x10.  Pt required multimodal cuing for proper technique and to facilitate improved neuromuscular control, strength, range of motion, and functional ability resulting in improved performance and form.  PATIENT EDUCATION:  Education details: Exercise purpose/form. Self management techniques. Education on diagnosis, prognosis, POC, anatomy and physiology of current condition. Education on HEP including handout. Person educated: Patient Education method: Explanation, Demonstration, Tactile cues, Verbal cues, and Handouts Education comprehension: verbalized understanding, returned demonstration, and needs further education  HOME EXERCISE PROGRAM: Access Code: [email or web address] URL: [email or web address] Date: 12/13/2022 Prepared by: Norton Blizzard  Exercises - Sit to  Stand Without Arm Support  - 3-5 x weekly - 3 sets - 5-10 reps - Standing March with Unilateral Counter Support  - 3-5 x weekly - 3 sets - 10 reps - Standing Knee Flexion with Unilateral Counter Support  - 3-5 x weekly - 3 sets - 10 reps - Standing Hip Abduction with Unilateral Counter Support  - 3-5 x weekly - 3 sets - 10 reps - Heel Toe Raises with Unilateral Counter Support  - 3-5 x weekly - 3 sets - 10 reps  Patient Education - Scar Massage  HOME EXERCISE PROGRAM [[email or web address]] View at "[email or web address]" using code: [email or web address] Half Kneel on Padded Chair  -  Repeat 10 Repetitions, Hold 5 Seconds, Complete 2 Sets, Perform 1 Times a Day  ASSESSMENT:  CLINICAL IMPRESSION:  Patient arrives with continued good tolerance for last PT session and HEP. Today's session introduced concept of RPE and continued working on LE strength and power. Patient had difficulty reaching upper levels of RPE but demonstrated improving LE power and ability to hop. She reported no increase in pain by end of session. Patient would benefit from continued management of limiting condition by skilled physical therapist to address remaining impairments and functional limitations to work towards stated goals and return to PLOF or maximal functional independence.    From Initial Evaluation (11/30/2022):  Patient is a 82 y.o. female referred to outpatient physical therapy with a medical diagnosis of right groin pain, acute bilateral low back pain without sciatica, left hip pain, weakness of both legs who presents with signs and symptoms consistent with generalized weakness and unsteadiness on feet with difficulty with prolonged standing due to low back pain who presents as a fall risk. Patient has experienced recent falls and difficulty getting up from the floor. During her exam she demonstrated posterior bias with difficulty performing 5 Times Sit To Stand without loss of balance backwards or using B UE. She also expresses intolerance  to kneeling after having both knees replaced and would benefit from desensitization training to improve her ability to knee so she can garden and get up from the floor more easily. She also has significant loss of hip extension ROM, which likely affects her gait and standing tolerance. Patient is finding it challenging to care  for herself and her mobility challenged husband due to her functional limitations and falls. Patient presents with significant pain, ROM, joint stiffness, gait, balance, muscle performance (strength/power/endurance), motor control, allodynia with kneeling, and activity tolerance impairments that are limiting ability to complete kneeling, getting up from the ground, not falling, caring for her husband, stairs, walking, getting up from sitting, bending, lifting, carrying, gardening, cooking, prolonged standing without difficulty. Patient will benefit from skilled physical therapy intervention to address current body structure impairments and activity limitations to improve function and work towards goals set in current POC in order to return to prior level of function or maximal functional improvement.    OBJECTIVE IMPAIRMENTS: Abnormal gait, decreased activity tolerance, decreased balance, decreased endurance, decreased knowledge of condition, decreased knowledge of use of DME, decreased mobility, difficulty walking, decreased ROM, decreased strength, hypomobility, increased edema, impaired perceived functional ability, impaired flexibility, obesity, and pain.   ACTIVITY LIMITATIONS: carrying, lifting, bending, standing, squatting, stairs, transfers, bed mobility, dressing, locomotion level, and caring for others  PARTICIPATION LIMITATIONS: meal prep, cleaning, laundry, interpersonal relationship, shopping, community activity, yard work, and   difficulty with kneeling, getting up from the ground, not falling, caring for her husband, stairs, walking, getting up from sitting, bending,  lifting, carrying, gardening, cooking, prolonged standing  PERSONAL FACTORS: Age, Fitness, Past/current experiences, Time since onset of injury/illness/exacerbation, and 3+ comorbidities:   plantar fasciitis, right foot pain (3 surgeries), osteopenia she has S/P total knee arthroplasty and Diverticulitis large intestine on their problem list. She  has a past medical history of Anemia, Anxiety, Arthritis, Cancer (HCC) (1999), GERD (gastroesophageal reflux disease), History of kidney stones, HOH (hard of hearing), Hypertension, Hypothyroidism, PONV (postoperative nausea and vomiting), and Tinnitus. She  has a past surgical history that includes Hemorroidectomy; Knee arthroscopy (Left); Liposuction; Blepharoplasty; Toe Surgery (Right); Varicose vein surgery; basil cell; Pelvic floor repair; Abdominal hysterectomy; Knee Arthroplasty (Left, 11/14/2016); Knee Arthroplasty (Right, 03/15/2017); total knee replacement (Bilateral, 11/2016); Cystocele repair (2005); Tonsillectomy; Shoulder arthroscopy with debridement and bicep tendon repair (Left, 09/24/2019); Cataract extraction w/PHACO (Left, 08/26/2020); and Cataract extraction w/PHACO (Right, 09/09/2020) are also affecting patient's functional outcome.   REHAB POTENTIAL: Good  CLINICAL DECISION MAKING: Evolving/moderate complexity  EVALUATION COMPLEXITY: Moderate   GOALS: Goals reviewed with patient? No  SHORT TERM GOALS: Target date: 12/14/2022  Patient will be independent with initial home exercise program for self-management of symptoms. Baseline: Initial HEP provided at IE (11/30/22); Goal status: In-progress   LONG TERM GOALS: Target date: 02/22/2023  Patient will be independent with a long-term home exercise program for self-management of symptoms.  Baseline: Initial HEP to be provided at visit 2 as appropriate (11/30/22); Goal status: In-progress  2.  Patient will demonstrate improved FOTO by equal or greater than 10 points by visit #10 to  demonstrate improvement in overall condition and self-reported functional ability.  Baseline: 56 (11/30/22); Goal status: In-progress  3.  Patient will complete 5 Times Sit To Stand Test in equal or less than 15 seconds from 18.5 inch or less surface with no LOB or use of B UE to improve her ability to get around her home safely and carry items when getting up from a seated position.  Baseline: 23 seconds with frequent LOB backwards including leaning against plinth and one failed attempt. No use of B UE on the plinth. From 18.5 inch plinth (11/30/22); Goal status: In-progress  4.  Patient will demonstrate the ability to complete the Floor Transfer Test in equal or less than 8.8 seconds  to improve her ability to get up from the ground when gardening or after a fall.   Baseline: to be tested at a later date (11/30/22); unable to complete floor transfer test without two person assistance (12/13/2022);  Goal status: In-progress  5.  Patient will complete community, work and/or recreational activities with 50% less  limitation due to current condition.  Baseline: difficulty with kneeling, getting up from the ground, not falling, caring for her husband, stairs, walking, getting up from sitting, bending, lifting, carrying, gardening, cooking, prolonged standing (11/30/22); Goal status: In-progress  6.  Patient will score equal or greater than 25 on the Functional Gait Assessment to demonstrate low fall risk.  Baseline: to be tested at later date as appropriate (11/30/2022);  Goal status: In-progress   PLAN:  PT FREQUENCY: 1-2x/week  PT DURATION: 12 weeks  PLANNED INTERVENTIONS: Therapeutic exercises, Therapeutic activity, Neuromuscular re-education, Balance training, Gait training, Patient/Family education, Self Care, Joint mobilization, Stair training, DME instructions, Dry Needling, Cognitive remediation, Electrical stimulation, Spinal mobilization, Cryotherapy, Moist heat, Manual therapy, and  Re-evaluation.  PLAN FOR NEXT SESSION: Update HEP as appropriate, progressive LE/core/functional strengthening and balance exercises. Education. Manual therapy as needed.    Cira Rue, PT,DPT 12/27/2022, 2:29 PM  [facility] [address] [place], [place] [address] P: [phone] I F: [phone]

## 2022-12-28 ENCOUNTER — Encounter: Payer: HMO | Admitting: Physical Therapy

## 2022-12-29 ENCOUNTER — Encounter: Payer: Self-pay | Admitting: Physical Therapy

## 2022-12-29 ENCOUNTER — Ambulatory Visit: Payer: HMO | Admitting: Physical Therapy

## 2022-12-29 DIAGNOSIS — M5459 Other low back pain: Secondary | ICD-10-CM

## 2022-12-29 DIAGNOSIS — R2689 Other abnormalities of gait and mobility: Secondary | ICD-10-CM

## 2022-12-29 DIAGNOSIS — M6281 Muscle weakness (generalized): Secondary | ICD-10-CM

## 2022-12-29 DIAGNOSIS — G8929 Other chronic pain: Secondary | ICD-10-CM

## 2022-12-29 DIAGNOSIS — Z9181 History of falling: Secondary | ICD-10-CM

## 2022-12-29 NOTE — Therapy (Signed)
OUTPATIENT PHYSICAL THERAPY TREATMENT   Patient Name: Mary Elliott MRN: [id number] DOB:1940-11-13, 82 y.o., female Today's Date: 12/29/2022  END OF SESSION:  PT End of Session - 12/29/22 0943     Visit Number 7    Number of Visits 13    Date for PT Re-Evaluation 02/22/23    Authorization Type [employer] reporting period from 11/30/2022    Progress Note Due on Visit 10    PT Start Time 0942    PT Stop Time 1022    PT Time Calculation (min) 40 min    Activity Tolerance Patient tolerated treatment well    Behavior During Therapy [facility] for tasks assessed/performed                  Past Medical History:  Diagnosis Date   Anemia    Anxiety    Arthritis    fingers   Cancer (HCC) 1999   basil cell on face   GERD (gastroesophageal reflux disease)    history of   History of kidney stones    twice   HOH (hard of hearing)    Hypertension    Hypothyroidism    PONV (postoperative nausea and vomiting)    nausea only with 2 prior surgeries   Tinnitus    Past Surgical History:  Procedure Laterality Date   ABDOMINAL HYSTERECTOMY     basil cell     face   BLEPHAROPLASTY     CATARACT EXTRACTION W/PHACO Left 08/26/2020   Procedure: CATARACT EXTRACTION PHACO AND INTRAOCULAR LENS PLACEMENT (IOC) LEFT PANOPTIX TORIC;  Surgeon: Lockie Mola, MD;  Location: [facility];  Service: Ophthalmology;  Laterality: Left;  8.88 1.:27.2 10.1%   CATARACT EXTRACTION W/PHACO Right 09/09/2020   Procedure: CATARACT EXTRACTION PHACO AND INTRAOCULAR LENS PLACEMENT (IOC) RIGHT PANOPTIX TORIC 8.54 01:03.3 13.5%;  Surgeon: Lockie Mola, MD;  Location: [facility];  Service: Ophthalmology;  Laterality: Right;   CYSTOCELE REPAIR  2005   HEMORROIDECTOMY     JOINT REPLACEMENT Bilateral 11/2016   KNEE ARTHROPLASTY Left 11/14/2016   Procedure: COMPUTER ASSISTED TOTAL KNEE ARTHROPLASTY;  Surgeon: Donato Heinz, MD;  Location: [facility];  Service: Orthopedics;   Laterality: Left;   KNEE ARTHROPLASTY Right 03/15/2017   Procedure: COMPUTER ASSISTED TOTAL KNEE ARTHROPLASTY;  Surgeon: Donato Heinz, MD;  Location: [facility];  Service: Orthopedics;  Laterality: Right;   KNEE ARTHROSCOPY Left    LIPOSUCTION     PELVIC FLOOR REPAIR     SHOULDER ARTHROSCOPY WITH DEBRIDEMENT AND BICEP TENDON REPAIR Left 09/24/2019   Procedure: Left shoulder arthroscopy with debridement, decompression, rotator cuff repair, and possible biceps tenodesis.;  Surgeon: Christena Flake, MD;  Location: [facility];  Service: Orthopedics;  Laterality: Left;   TOE SURGERY Right    TONSILLECTOMY     VARICOSE VEIN SURGERY     Patient Active Problem List   Diagnosis Date Noted   Diverticulitis large intestine 05/08/2022   S/P total knee arthroplasty 11/14/2016    PCP: Lynnea Ferrier, MD  REFERRING PROVIDER: Dayton Bailiff, PA  REFERRING DIAG: right groin pain, acute bilateral low back pain without sciatica, left hip pain, weakness of both legs  Rationale for Evaluation and Treatment: Rehabilitation  THERAPY DIAG:  Other low back pain  Other abnormalities of gait and mobility  Chronic pain of both knees  History of falling  Muscle weakness (generalized)  ONSET DATE: possibly chronic but current exacerbation started approximately January 2024  PERTINENT HISTORY:  Patient is a 82 y.o. female who presents to outpatient physical therapy with a referral for medical diagnosis right groin pain, acute bilateral low back pain without sciatica, left hip pain, weakness of both legs. This patient's chief complaints consist of difficulty with mobility, unsteadiness on her feet, falls, low back pain with prolonged standing, and anterior knee pain with kneeling, leading to the following functional deficits: difficulty with kneeling, getting up from the ground, not falling, caring for her husband, stairs, walking, getting up from sitting, bending, lifting, carrying, gardening, cooking,  prolonged standing. Relevant past medical history and comorbidities include plantar fasciitis, right foot pain (3 surgeries), osteopenia she has S/P total knee arthroplasty and Diverticulitis large intestine on their problem list. She  has a past medical history of Anemia, Anxiety, Arthritis, Cancer (HCC) (1999), GERD (gastroesophageal reflux disease), History of kidney stones, HOH (hard of hearing), Hypertension, Hypothyroidism, PONV (postoperative nausea and vomiting), and Tinnitus. She  has a past surgical history that includes Hemorroidectomy; Knee arthroscopy (Left); Liposuction; Blepharoplasty; Toe Surgery (Right); Varicose vein surgery; basil cell; Pelvic floor repair; Abdominal hysterectomy; Knee Arthroplasty (Left, 11/14/2016); Knee Arthroplasty (Right, 03/15/2017); total knee replacement (Bilateral, 11/2016); Cystocele repair (2005); Tonsillectomy; Shoulder arthroscopy with debridement and bicep tendon repair (Left, 09/24/2019); Cataract extraction w/PHACO (Left, 08/26/2020); and Cataract extraction w/PHACO (Right, 09/09/2020). Patient denies hx of stroke, seizures, lung problems, heart problems, diabetes, unexplained weight loss, unexplained changes in bowel or bladder problems, and spinal surgery  SUBJECTIVE:                                                                                                                                                                                           SUBJECTIVE STATEMENT: Patient states she can't say she wasn't sore after last PT session but is was not too bad. She had some sudden pain at her right medial forearm yesterday. She felt it for 10-15 minutes and it went away when she she changed positions. She reports no pain upon arrival today. She is packing later today for a trip to [place] for a wedding over the weekend. She had right heel pain when she first got out of bed this morning. She worse some Keen sandals yestrday and thought they were really supportive.    Review of medical chart shows patient under went the following procedure, performed by Dr. Joice Lofts on 09/24/2019: Limited arthroscopic debridement, arthroscopic subacromial decompression, mini-open rotator cuff repair using [employer] Regeneten patch, and mini-open biceps tenodesis, left shoulder.   PAIN:  NPRS: 0/10   PRECAUTIONS: Fall  PATIENT GOALS: "stay on my feet" "be able to get up out of a chair"  improve her core strength  OBJECTIVE  TODAY'S TREATMENT:     Therapeutic exercise: to centralize symptoms and improve ROM, strength, muscular endurance, and activity tolerance required for successful completion of functional activities.  - NuStep level 5 using bilateral upper and lower extremities. Seat/handle setting 8/10. For improved extremity mobility, muscular endurance, and activity tolerance; and to induce the analgesic effect of aerobic exercise, stimulate improved joint nutrition, and prepare body structures and systems for following interventions. x 5  minutes. Average SPM = 59. Cued to go at least 60 steps per minute, challenged to keep this pace at level 5.  - sit <> stand with TRX from from 12 inch step, 3x15 with BlackTB loop around hips to floor (with PT standing on them).  - attempted to get up from 12 inch step with no straps, but unable and also unable to rock forwards onto hands on floor to lift buttocks due to L knee pain.  - TOTAL gym squats to 90 or lower, 3x10 at level 23.   Pt required multimodal cuing for proper technique and to facilitate improved neuromuscular control, strength, range of motion, and functional ability resulting in improved performance and form.  PATIENT EDUCATION:  Education details: Exercise purpose/form. Self management techniques. Education on diagnosis, prognosis, POC, anatomy and physiology of current condition. Education on HEP including handout. Person educated: Patient Education method: Explanation, Demonstration, Tactile cues, Verbal  cues, and Handouts Education comprehension: verbalized understanding, returned demonstration, and needs further education  HOME EXERCISE PROGRAM: Access Code: [email or web address] URL: [email or web address] Date: 12/13/2022 Prepared by: Norton Blizzard  Exercises - Sit to Stand Without Arm Support  - 3-5 x weekly - 3 sets - 5-10 reps - Standing March with Unilateral Counter Support  - 3-5 x weekly - 3 sets - 10 reps - Standing Knee Flexion with Unilateral Counter Support  - 3-5 x weekly - 3 sets - 10 reps - Standing Hip Abduction with Unilateral Counter Support  - 3-5 x weekly - 3 sets - 10 reps - Heel Toe Raises with Unilateral Counter Support  - 3-5 x weekly - 3 sets - 10 reps  Patient Education - Scar Massage  HOME EXERCISE PROGRAM [[email or web address]] View at "[email or web address]" using code: [email or web address] Half Kneel on Padded Chair  -  Repeat 10 Repetitions, Hold 5 Seconds, Complete 2 Sets, Perform 1 Times a Day  ASSESSMENT:  CLINICAL IMPRESSION:  Patient arrives with continued tolerance to last PT session. Today's session continued with focus on LE and total body strength to improve fall risk and function. Patient was strongly challenged but reported no lasting increase in pain. Plan to incorporate more balance specific exercises next session. Patient would benefit from continued management of limiting condition by skilled physical therapist to address remaining impairments and functional limitations to work towards stated goals and return to PLOF or maximal functional independence.    From Initial Evaluation (11/30/2022):  Patient is a 82 y.o. female referred to outpatient physical therapy with a medical diagnosis of right groin pain, acute bilateral low back pain without sciatica, left hip pain, weakness of both legs who presents with signs and symptoms consistent with generalized weakness and unsteadiness on feet with difficulty with prolonged standing due to low back pain who presents as a fall  risk. Patient has experienced recent falls and difficulty getting up from the floor. During her exam she demonstrated posterior bias with difficulty performing 5 Times Sit To Stand without loss of balance backwards or using B UE. She also expresses intolerance  to kneeling after having both knees replaced and would benefit from desensitization training to improve her ability to knee so she can garden and get up from the floor more easily. She also has significant loss of hip extension ROM, which likely affects her gait and standing tolerance. Patient is finding it challenging to care for herself and her mobility challenged husband due to her functional limitations and falls. Patient presents with significant pain, ROM, joint stiffness, gait, balance, muscle performance (strength/power/endurance), motor control, allodynia with kneeling, and activity tolerance impairments that are limiting ability to complete kneeling, getting up from the ground, not falling, caring for her husband, stairs, walking, getting up from sitting, bending, lifting, carrying, gardening, cooking, prolonged standing without difficulty. Patient will benefit from skilled physical therapy intervention to address current body structure impairments and activity limitations to improve function and work towards goals set in current POC in order to return to prior level of function or maximal functional improvement.    OBJECTIVE IMPAIRMENTS: Abnormal gait, decreased activity tolerance, decreased balance, decreased endurance, decreased knowledge of condition, decreased knowledge of use of DME, decreased mobility, difficulty walking, decreased ROM, decreased strength, hypomobility, increased edema, impaired perceived functional ability, impaired flexibility, obesity, and pain.   ACTIVITY LIMITATIONS: carrying, lifting, bending, standing, squatting, stairs, transfers, bed mobility, dressing, locomotion level, and caring for others  PARTICIPATION  LIMITATIONS: meal prep, cleaning, laundry, interpersonal relationship, shopping, community activity, yard work, and   difficulty with kneeling, getting up from the ground, not falling, caring for her husband, stairs, walking, getting up from sitting, bending, lifting, carrying, gardening, cooking, prolonged standing  PERSONAL FACTORS: Age, Fitness, Past/current experiences, Time since onset of injury/illness/exacerbation, and 3+ comorbidities:   plantar fasciitis, right foot pain (3 surgeries), osteopenia she has S/P total knee arthroplasty and Diverticulitis large intestine on their problem list. She  has a past medical history of Anemia, Anxiety, Arthritis, Cancer (HCC) (1999), GERD (gastroesophageal reflux disease), History of kidney stones, HOH (hard of hearing), Hypertension, Hypothyroidism, PONV (postoperative nausea and vomiting), and Tinnitus. She  has a past surgical history that includes Hemorroidectomy; Knee arthroscopy (Left); Liposuction; Blepharoplasty; Toe Surgery (Right); Varicose vein surgery; basil cell; Pelvic floor repair; Abdominal hysterectomy; Knee Arthroplasty (Left, 11/14/2016); Knee Arthroplasty (Right, 03/15/2017); total knee replacement (Bilateral, 11/2016); Cystocele repair (2005); Tonsillectomy; Shoulder arthroscopy with debridement and bicep tendon repair (Left, 09/24/2019); Cataract extraction w/PHACO (Left, 08/26/2020); and Cataract extraction w/PHACO (Right, 09/09/2020) are also affecting patient's functional outcome.   REHAB POTENTIAL: Good  CLINICAL DECISION MAKING: Evolving/moderate complexity  EVALUATION COMPLEXITY: Moderate   GOALS: Goals reviewed with patient? No  SHORT TERM GOALS: Target date: 12/14/2022  Patient will be independent with initial home exercise program for self-management of symptoms. Baseline: Initial HEP provided at IE (11/30/22); Goal status: In-progress   LONG TERM GOALS: Target date: 02/22/2023  Patient will be independent with a long-term  home exercise program for self-management of symptoms.  Baseline: Initial HEP to be provided at visit 2 as appropriate (11/30/22); Goal status: In-progress  2.  Patient will demonstrate improved FOTO by equal or greater than 10 points by visit #10 to demonstrate improvement in overall condition and self-reported functional ability.  Baseline: 56 (11/30/22); Goal status: In-progress  3.  Patient will complete 5 Times Sit To Stand Test in equal or less than 15 seconds from 18.5 inch or less surface with no LOB or use of B UE to improve her ability to get around her home safely and carry items when getting up from a  seated position.  Baseline: 23 seconds with frequent LOB backwards including leaning against plinth and one failed attempt. No use of B UE on the plinth. From 18.5 inch plinth (11/30/22); Goal status: In-progress  4.  Patient will demonstrate the ability to complete the Floor Transfer Test in equal or less than 8.8 seconds to improve her ability to get up from the ground when gardening or after a fall.   Baseline: to be tested at a later date (11/30/22); unable to complete floor transfer test without two person assistance (12/13/2022);  Goal status: In-progress  5.  Patient will complete community, work and/or recreational activities with 50% less  limitation due to current condition.  Baseline: difficulty with kneeling, getting up from the ground, not falling, caring for her husband, stairs, walking, getting up from sitting, bending, lifting, carrying, gardening, cooking, prolonged standing (11/30/22); Goal status: In-progress  6.  Patient will score equal or greater than 25 on the Functional Gait Assessment to demonstrate low fall risk.  Baseline: to be tested at later date as appropriate (11/30/2022);  Goal status: In-progress   PLAN:  PT FREQUENCY: 1-2x/week  PT DURATION: 12 weeks  PLANNED INTERVENTIONS: Therapeutic exercises, Therapeutic activity, Neuromuscular re-education,  Balance training, Gait training, Patient/Family education, Self Care, Joint mobilization, Stair training, DME instructions, Dry Needling, Cognitive remediation, Electrical stimulation, Spinal mobilization, Cryotherapy, Moist heat, Manual therapy, and Re-evaluation.  PLAN FOR NEXT SESSION: Update HEP as appropriate, progressive LE/core/functional strengthening and balance exercises. Education. Manual therapy as needed.    Cira Rue, PT,DPT 12/29/2022, 10:30 AM  [facility] [address] [place], [place] [address] P: [phone] I F: [phone]

## 2023-01-02 ENCOUNTER — Ambulatory Visit: Payer: HMO | Admitting: Physical Therapy

## 2023-01-02 ENCOUNTER — Encounter: Payer: Self-pay | Admitting: Physical Therapy

## 2023-01-02 DIAGNOSIS — M5459 Other low back pain: Secondary | ICD-10-CM

## 2023-01-02 DIAGNOSIS — R2689 Other abnormalities of gait and mobility: Secondary | ICD-10-CM

## 2023-01-02 DIAGNOSIS — M6281 Muscle weakness (generalized): Secondary | ICD-10-CM

## 2023-01-02 DIAGNOSIS — Z9181 History of falling: Secondary | ICD-10-CM

## 2023-01-02 DIAGNOSIS — G8929 Other chronic pain: Secondary | ICD-10-CM

## 2023-01-02 NOTE — Therapy (Signed)
OUTPATIENT PHYSICAL THERAPY TREATMENT   Patient Name: Mary Elliott MRN: [id number] DOB:1941/01/27, 82 y.o., female Today's Date: 01/02/2023  END OF SESSION:  PT End of Session - 01/02/23 1126     Visit Number 8    Number of Visits 13    Date for PT Re-Evaluation 02/22/23    Authorization Type [employer] reporting period from 11/30/2022    Progress Note Due on Visit 10    PT Start Time 1121    PT Stop Time 1200    PT Time Calculation (min) 39 min    Activity Tolerance Patient tolerated treatment well    Behavior During Therapy WFL for tasks assessed/performed                   Past Medical History:  Diagnosis Date   Anemia    Anxiety    Arthritis    fingers   Cancer (HCC) 1999   basil cell on face   GERD (gastroesophageal reflux disease)    history of   History of kidney stones    twice   HOH (hard of hearing)    Hypertension    Hypothyroidism    PONV (postoperative nausea and vomiting)    nausea only with 2 prior surgeries   Tinnitus    Past Surgical History:  Procedure Laterality Date   ABDOMINAL HYSTERECTOMY     basil cell     face   BLEPHAROPLASTY     CATARACT EXTRACTION W/PHACO Left 08/26/2020   Procedure: CATARACT EXTRACTION PHACO AND INTRAOCULAR LENS PLACEMENT (IOC) LEFT PANOPTIX TORIC;  Surgeon: Lockie Mola, MD;  Location: [facility];  Service: Ophthalmology;  Laterality: Left;  8.88 1.:27.2 10.1%   CATARACT EXTRACTION W/PHACO Right 09/09/2020   Procedure: CATARACT EXTRACTION PHACO AND INTRAOCULAR LENS PLACEMENT (IOC) RIGHT PANOPTIX TORIC 8.54 01:03.3 13.5%;  Surgeon: Lockie Mola, MD;  Location: [facility];  Service: Ophthalmology;  Laterality: Right;   CYSTOCELE REPAIR  2005   HEMORROIDECTOMY     JOINT REPLACEMENT Bilateral 11/2016   KNEE ARTHROPLASTY Left 11/14/2016   Procedure: COMPUTER ASSISTED TOTAL KNEE ARTHROPLASTY;  Surgeon: Donato Heinz, MD;  Location: [facility];  Service: Orthopedics;   Laterality: Left;   KNEE ARTHROPLASTY Right 03/15/2017   Procedure: COMPUTER ASSISTED TOTAL KNEE ARTHROPLASTY;  Surgeon: Donato Heinz, MD;  Location: [facility];  Service: Orthopedics;  Laterality: Right;   KNEE ARTHROSCOPY Left    LIPOSUCTION     PELVIC FLOOR REPAIR     SHOULDER ARTHROSCOPY WITH DEBRIDEMENT AND BICEP TENDON REPAIR Left 09/24/2019   Procedure: Left shoulder arthroscopy with debridement, decompression, rotator cuff repair, and possible biceps tenodesis.;  Surgeon: Christena Flake, MD;  Location: [facility];  Service: Orthopedics;  Laterality: Left;   TOE SURGERY Right    TONSILLECTOMY     VARICOSE VEIN SURGERY     Patient Active Problem List   Diagnosis Date Noted   Diverticulitis large intestine 05/08/2022   S/P total knee arthroplasty 11/14/2016    PCP: Lynnea Ferrier, MD  REFERRING PROVIDER: Dayton Bailiff, PA  REFERRING DIAG: right groin pain, acute bilateral low back pain without sciatica, left hip pain, weakness of both legs  Rationale for Evaluation and Treatment: Rehabilitation  THERAPY DIAG:  Other low back pain  Other abnormalities of gait and mobility  Chronic pain of both knees  History of falling  Muscle weakness (generalized)  ONSET DATE: possibly chronic but current exacerbation started approximately January 2024  PERTINENT HISTORY:  Patient is a 82 y.o. female who presents to outpatient physical therapy with a referral for medical diagnosis right groin pain, acute bilateral low back pain without sciatica, left hip pain, weakness of both legs. This patient's chief complaints consist of difficulty with mobility, unsteadiness on her feet, falls, low back pain with prolonged standing, and anterior knee pain with kneeling, leading to the following functional deficits: difficulty with kneeling, getting up from the ground, not falling, caring for her husband, stairs, walking, getting up from sitting, bending, lifting, carrying, gardening, cooking,  prolonged standing. Relevant past medical history and comorbidities include plantar fasciitis, right foot pain (3 surgeries), osteopenia she has S/P total knee arthroplasty and Diverticulitis large intestine on their problem list. She  has a past medical history of Anemia, Anxiety, Arthritis, Cancer (HCC) (1999), GERD (gastroesophageal reflux disease), History of kidney stones, HOH (hard of hearing), Hypertension, Hypothyroidism, PONV (postoperative nausea and vomiting), and Tinnitus. She  has a past surgical history that includes Hemorroidectomy; Knee arthroscopy (Left); Liposuction; Blepharoplasty; Toe Surgery (Right); Varicose vein surgery; basil cell; Pelvic floor repair; Abdominal hysterectomy; Knee Arthroplasty (Left, 11/14/2016); Knee Arthroplasty (Right, 03/15/2017); total knee replacement (Bilateral, 11/2016); Cystocele repair (2005); Tonsillectomy; Shoulder arthroscopy with debridement and bicep tendon repair (Left, 09/24/2019); Cataract extraction w/PHACO (Left, 08/26/2020); and Cataract extraction w/PHACO (Right, 09/09/2020). Patient denies hx of stroke, seizures, lung problems, heart problems, diabetes, unexplained weight loss, unexplained changes in bowel or bladder problems, and spinal surgery  SUBJECTIVE:                                                                                                                                                                                           SUBJECTIVE STATEMENT: Patient states she had a big travel weekend for a wedding. She was on her feet the whole weekend but also had a lot of sitting while traveling. She does not feel like coming to PT today but she is not having much pain. She was even able to get on the dance floor on Saturday evening. She continues to notice she "walks funny." She is frustrated by his husband staying in his chair all the time and she is worried about him not getting stronger since he does not get up. He has HHPT but they are going  to evaluate if he can have more next visit. She states he is not taking his medications like he is supposed to.   Review of medical chart shows patient under went the following procedure, performed by Dr. Joice Lofts on 09/24/2019: Limited arthroscopic debridement, arthroscopic subacromial decompression, mini-open rotator cuff repair using [employer] Regeneten patch, and mini-open biceps tenodesis,  left shoulder.   PAIN:  NPRS: 1-2/10 low back  PRECAUTIONS: Fall  PATIENT GOALS: "stay on my feet" "be able to get up out of a chair" improve her core strength  OBJECTIVE  TODAY'S TREATMENT:     Therapeutic exercise: to centralize symptoms and improve ROM, strength, muscular endurance, and activity tolerance required for successful completion of functional activities.  - NuStep level 5 using bilateral upper and lower extremities. Seat/handle setting 8/10. For improved extremity mobility, muscular endurance, and activity tolerance; and to induce the analgesic effect of aerobic exercise, stimulate improved joint nutrition, and prepare body structures and systems for following interventions. x 5  minutes. Average SPM = 59. Cued to go at least 60 steps per minute, challenged to keep this pace at level 5.  - stand<>high kneeling transfer with help of B UE on chair and SBA, 1x10 each side, decreasing kneel height from airex + a2z pad -> a2z pad only -> pillow. - floor transfer on floor mat with B UE support on chair and SBA. Patient unable to come to quadruped from seated and had difficulty pushing up to quadruped from prone.   - standing wall push up, 2x10.   Pt required multimodal cuing for proper technique and to facilitate improved neuromuscular control, strength, range of motion, and functional ability resulting in improved performance and form.  PATIENT EDUCATION:  Education details: Exercise purpose/form. Self management techniques. Education on diagnosis, prognosis, POC, anatomy and physiology of  current condition. Education on HEP including handout. Person educated: Patient Education method: Explanation, Demonstration, Tactile cues, Verbal cues, and Handouts Education comprehension: verbalized understanding, returned demonstration, and needs further education  HOME EXERCISE PROGRAM: Access Code: [email or web address] URL: [email or web address] Date: 12/13/2022 Prepared by: Norton Blizzard  Exercises - Sit to Stand Without Arm Support  - 3-5 x weekly - 3 sets - 5-10 reps - Standing March with Unilateral Counter Support  - 3-5 x weekly - 3 sets - 10 reps - Standing Knee Flexion with Unilateral Counter Support  - 3-5 x weekly - 3 sets - 10 reps - Standing Hip Abduction with Unilateral Counter Support  - 3-5 x weekly - 3 sets - 10 reps - Heel Toe Raises with Unilateral Counter Support  - 3-5 x weekly - 3 sets - 10 reps  Patient Education - Scar Massage  HOME EXERCISE PROGRAM [[email or web address]] View at "[email or web address]" using code: [email or web address] Half Kneel on Padded Chair  -  Repeat 10 Repetitions, Hold 5 Seconds, Complete 2 Sets, Perform 1 Times a Day  ASSESSMENT:  CLINICAL IMPRESSION:  Patient arrives with fatigue from recent trip to [place] but with report of good overall trip. Today's session focused more on floor transfer skills. She was able to progress to kneeling transfer with less padding under her knees and was able to get up from seated on the floor this time without physical assistance but still had a lot of difficulty due to L knee pain, R shoulder pain, and B LE, B UE, and core strength deficits. Patient would benefit from continued management of limiting condition by skilled physical therapist to address remaining impairments and functional limitations to work towards stated goals and return to PLOF or maximal functional independence.    From Initial Evaluation (11/30/2022):  Patient is a 82 y.o. female referred to outpatient physical therapy with a medical diagnosis of right  groin pain, acute bilateral low back pain without sciatica, left hip pain, weakness of both legs who presents with signs and symptoms consistent with generalized  weakness and unsteadiness on feet with difficulty with prolonged standing due to low back pain who presents as a fall risk. Patient has experienced recent falls and difficulty getting up from the floor. During her exam she demonstrated posterior bias with difficulty performing 5 Times Sit To Stand without loss of balance backwards or using B UE. She also expresses intolerance to kneeling after having both knees replaced and would benefit from desensitization training to improve her ability to knee so she can garden and get up from the floor more easily. She also has significant loss of hip extension ROM, which likely affects her gait and standing tolerance. Patient is finding it challenging to care for herself and her mobility challenged husband due to her functional limitations and falls. Patient presents with significant pain, ROM, joint stiffness, gait, balance, muscle performance (strength/power/endurance), motor control, allodynia with kneeling, and activity tolerance impairments that are limiting ability to complete kneeling, getting up from the ground, not falling, caring for her husband, stairs, walking, getting up from sitting, bending, lifting, carrying, gardening, cooking, prolonged standing without difficulty. Patient will benefit from skilled physical therapy intervention to address current body structure impairments and activity limitations to improve function and work towards goals set in current POC in order to return to prior level of function or maximal functional improvement.    OBJECTIVE IMPAIRMENTS: Abnormal gait, decreased activity tolerance, decreased balance, decreased endurance, decreased knowledge of condition, decreased knowledge of use of DME, decreased mobility, difficulty walking, decreased ROM, decreased strength,  hypomobility, increased edema, impaired perceived functional ability, impaired flexibility, obesity, and pain.   ACTIVITY LIMITATIONS: carrying, lifting, bending, standing, squatting, stairs, transfers, bed mobility, dressing, locomotion level, and caring for others  PARTICIPATION LIMITATIONS: meal prep, cleaning, laundry, interpersonal relationship, shopping, community activity, yard work, and   difficulty with kneeling, getting up from the ground, not falling, caring for her husband, stairs, walking, getting up from sitting, bending, lifting, carrying, gardening, cooking, prolonged standing  PERSONAL FACTORS: Age, Fitness, Past/current experiences, Time since onset of injury/illness/exacerbation, and 3+ comorbidities:   plantar fasciitis, right foot pain (3 surgeries), osteopenia she has S/P total knee arthroplasty and Diverticulitis large intestine on their problem list. She  has a past medical history of Anemia, Anxiety, Arthritis, Cancer (HCC) (1999), GERD (gastroesophageal reflux disease), History of kidney stones, HOH (hard of hearing), Hypertension, Hypothyroidism, PONV (postoperative nausea and vomiting), and Tinnitus. She  has a past surgical history that includes Hemorroidectomy; Knee arthroscopy (Left); Liposuction; Blepharoplasty; Toe Surgery (Right); Varicose vein surgery; basil cell; Pelvic floor repair; Abdominal hysterectomy; Knee Arthroplasty (Left, 11/14/2016); Knee Arthroplasty (Right, 03/15/2017); total knee replacement (Bilateral, 11/2016); Cystocele repair (2005); Tonsillectomy; Shoulder arthroscopy with debridement and bicep tendon repair (Left, 09/24/2019); Cataract extraction w/PHACO (Left, 08/26/2020); and Cataract extraction w/PHACO (Right, 09/09/2020) are also affecting patient's functional outcome.   REHAB POTENTIAL: Good  CLINICAL DECISION MAKING: Evolving/moderate complexity  EVALUATION COMPLEXITY: Moderate   GOALS: Goals reviewed with patient? No  SHORT TERM GOALS: Target  date: 12/14/2022  Patient will be independent with initial home exercise program for self-management of symptoms. Baseline: Initial HEP provided at IE (11/30/22); Goal status: In-progress   LONG TERM GOALS: Target date: 02/22/2023  Patient will be independent with a long-term home exercise program for self-management of symptoms.  Baseline: Initial HEP to be provided at visit 2 as appropriate (11/30/22); Goal status: In-progress  2.  Patient will demonstrate improved FOTO by equal or greater than 10 points by visit #10 to demonstrate improvement in overall condition and  self-reported functional ability.  Baseline: 56 (11/30/22); Goal status: In-progress  3.  Patient will complete 5 Times Sit To Stand Test in equal or less than 15 seconds from 18.5 inch or less surface with no LOB or use of B UE to improve her ability to get around her home safely and carry items when getting up from a seated position.  Baseline: 23 seconds with frequent LOB backwards including leaning against plinth and one failed attempt. No use of B UE on the plinth. From 18.5 inch plinth (11/30/22); Goal status: In-progress  4.  Patient will demonstrate the ability to complete the Floor Transfer Test in equal or less than 8.8 seconds to improve her ability to get up from the ground when gardening or after a fall.   Baseline: to be tested at a later date (11/30/22); unable to complete floor transfer test without two person assistance (12/13/2022);  Goal status: In-progress  5.  Patient will complete community, work and/or recreational activities with 50% less  limitation due to current condition.  Baseline: difficulty with kneeling, getting up from the ground, not falling, caring for her husband, stairs, walking, getting up from sitting, bending, lifting, carrying, gardening, cooking, prolonged standing (11/30/22); Goal status: In-progress  6.  Patient will score equal or greater than 25 on the Functional Gait Assessment  to demonstrate low fall risk.  Baseline: to be tested at later date as appropriate (11/30/2022);  Goal status: In-progress   PLAN:  PT FREQUENCY: 1-2x/week  PT DURATION: 12 weeks  PLANNED INTERVENTIONS: Therapeutic exercises, Therapeutic activity, Neuromuscular re-education, Balance training, Gait training, Patient/Family education, Self Care, Joint mobilization, Stair training, DME instructions, Dry Needling, Cognitive remediation, Electrical stimulation, Spinal mobilization, Cryotherapy, Moist heat, Manual therapy, and Re-evaluation.  PLAN FOR NEXT SESSION: Update HEP as appropriate, progressive LE/core/functional strengthening and balance exercises. Education. Manual therapy as needed.    Cira Rue, PT,DPT 01/02/2023, 3:46 PM  [facility] [address] [place], [place] [address] P: [phone] I F: [phone]

## 2023-01-03 DIAGNOSIS — H04123 Dry eye syndrome of bilateral lacrimal glands: Secondary | ICD-10-CM | POA: Diagnosis not present

## 2023-01-03 DIAGNOSIS — H1013 Acute atopic conjunctivitis, bilateral: Secondary | ICD-10-CM | POA: Diagnosis not present

## 2023-01-05 ENCOUNTER — Encounter: Payer: Self-pay | Admitting: Physical Therapy

## 2023-01-05 ENCOUNTER — Ambulatory Visit: Payer: HMO | Admitting: Physical Therapy

## 2023-01-05 DIAGNOSIS — M6281 Muscle weakness (generalized): Secondary | ICD-10-CM

## 2023-01-05 DIAGNOSIS — G8929 Other chronic pain: Secondary | ICD-10-CM

## 2023-01-05 DIAGNOSIS — R2689 Other abnormalities of gait and mobility: Secondary | ICD-10-CM

## 2023-01-05 DIAGNOSIS — M5459 Other low back pain: Secondary | ICD-10-CM

## 2023-01-05 DIAGNOSIS — Z9181 History of falling: Secondary | ICD-10-CM

## 2023-01-05 NOTE — Therapy (Signed)
OUTPATIENT PHYSICAL THERAPY TREATMENT   Patient Name: Mary Elliott MRN: [id number] DOB:Jan 26, 1941, 82 y.o., female Today's Date: 01/05/2023  END OF SESSION:  PT End of Session - 01/05/23 1303     Visit Number 9    Number of Visits 13    Date for PT Re-Evaluation 02/22/23    Authorization Type [employer] reporting period from 11/30/2022    Progress Note Due on Visit 10    PT Start Time 1303    PT Stop Time 1341    PT Time Calculation (min) 38 min    Activity Tolerance Patient tolerated treatment well    Behavior During Therapy [facility] for tasks assessed/performed             Past Medical History:  Diagnosis Date   Anemia    Anxiety    Arthritis    fingers   Cancer (HCC) 1999   basil cell on face   GERD (gastroesophageal reflux disease)    history of   History of kidney stones    twice   HOH (hard of hearing)    Hypertension    Hypothyroidism    PONV (postoperative nausea and vomiting)    nausea only with 2 prior surgeries   Tinnitus    Past Surgical History:  Procedure Laterality Date   ABDOMINAL HYSTERECTOMY     basil cell     face   BLEPHAROPLASTY     CATARACT EXTRACTION W/PHACO Left 08/26/2020   Procedure: CATARACT EXTRACTION PHACO AND INTRAOCULAR LENS PLACEMENT (IOC) LEFT PANOPTIX TORIC;  Surgeon: Lockie Mola, MD;  Location: [facility];  Service: Ophthalmology;  Laterality: Left;  8.88 1.:27.2 10.1%   CATARACT EXTRACTION W/PHACO Right 09/09/2020   Procedure: CATARACT EXTRACTION PHACO AND INTRAOCULAR LENS PLACEMENT (IOC) RIGHT PANOPTIX TORIC 8.54 01:03.3 13.5%;  Surgeon: Lockie Mola, MD;  Location: [facility];  Service: Ophthalmology;  Laterality: Right;   CYSTOCELE REPAIR  2005   HEMORROIDECTOMY     JOINT REPLACEMENT Bilateral 11/2016   KNEE ARTHROPLASTY Left 11/14/2016   Procedure: COMPUTER ASSISTED TOTAL KNEE ARTHROPLASTY;  Surgeon: Donato Heinz, MD;  Location: [facility];  Service: Orthopedics;  Laterality:  Left;   KNEE ARTHROPLASTY Right 03/15/2017   Procedure: COMPUTER ASSISTED TOTAL KNEE ARTHROPLASTY;  Surgeon: Donato Heinz, MD;  Location: [facility];  Service: Orthopedics;  Laterality: Right;   KNEE ARTHROSCOPY Left    LIPOSUCTION     PELVIC FLOOR REPAIR     SHOULDER ARTHROSCOPY WITH DEBRIDEMENT AND BICEP TENDON REPAIR Left 09/24/2019   Procedure: Left shoulder arthroscopy with debridement, decompression, rotator cuff repair, and possible biceps tenodesis.;  Surgeon: Christena Flake, MD;  Location: [facility];  Service: Orthopedics;  Laterality: Left;   TOE SURGERY Right    TONSILLECTOMY     VARICOSE VEIN SURGERY     Patient Active Problem List   Diagnosis Date Noted   Diverticulitis large intestine 05/08/2022   S/P total knee arthroplasty 11/14/2016    PCP: Lynnea Ferrier, MD  REFERRING PROVIDER: Dayton Bailiff, PA  REFERRING DIAG: right groin pain, acute bilateral low back pain without sciatica, left hip pain, weakness of both legs  Rationale for Evaluation and Treatment: Rehabilitation  THERAPY DIAG:  Other low back pain  Other abnormalities of gait and mobility  Chronic pain of both knees  History of falling  Muscle weakness (generalized)  ONSET DATE: possibly chronic but current exacerbation started approximately January 2024  PERTINENT HISTORY:  Patient is a 82 y.o.  female who presents to outpatient physical therapy with a referral for medical diagnosis right groin pain, acute bilateral low back pain without sciatica, left hip pain, weakness of both legs. This patient's chief complaints consist of difficulty with mobility, unsteadiness on her feet, falls, low back pain with prolonged standing, and anterior knee pain with kneeling, leading to the following functional deficits: difficulty with kneeling, getting up from the ground, not falling, caring for her husband, stairs, walking, getting up from sitting, bending, lifting, carrying, gardening, cooking, prolonged  standing. Relevant past medical history and comorbidities include plantar fasciitis, right foot pain (3 surgeries), osteopenia she has S/P total knee arthroplasty and Diverticulitis large intestine on their problem list. She  has a past medical history of Anemia, Anxiety, Arthritis, Cancer (HCC) (1999), GERD (gastroesophageal reflux disease), History of kidney stones, HOH (hard of hearing), Hypertension, Hypothyroidism, PONV (postoperative nausea and vomiting), and Tinnitus. She  has a past surgical history that includes Hemorroidectomy; Knee arthroscopy (Left); Liposuction; Blepharoplasty; Toe Surgery (Right); Varicose vein surgery; basil cell; Pelvic floor repair; Abdominal hysterectomy; Knee Arthroplasty (Left, 11/14/2016); Knee Arthroplasty (Right, 03/15/2017); total knee replacement (Bilateral, 11/2016); Cystocele repair (2005); Tonsillectomy; Shoulder arthroscopy with debridement and bicep tendon repair (Left, 09/24/2019); Cataract extraction w/PHACO (Left, 08/26/2020); and Cataract extraction w/PHACO (Right, 09/09/2020). Patient denies hx of stroke, seizures, lung problems, heart problems, diabetes, unexplained weight loss, unexplained changes in bowel or bladder problems, and spinal surgery  SUBJECTIVE:                                                                                                                                                                                           SUBJECTIVE STATEMENT: Patient states she was pretty active on Tuesday. She was pretty sore yesterday in the back and hips that caused her some trouble with the squatting things. She is not feeling as well as she wants to today. She states she was sitting a lot eating with friends and felt pretty stiff when she got up.   Review of medical chart shows patient under went the following procedure, performed by Dr. Joice Lofts on 09/24/2019: Limited arthroscopic debridement, arthroscopic subacromial decompression, mini-open rotator cuff  repair using [employer] Regeneten patch, and mini-open biceps tenodesis, left shoulder.   PAIN:  NPRS: 1-2/10 low back  PRECAUTIONS: Fall  PATIENT GOALS: "stay on my feet" "be able to get up out of a chair" improve her core strength  OBJECTIVE  TODAY'S TREATMENT:     Therapeutic exercise: to centralize symptoms and improve ROM, strength, muscular endurance, and activity tolerance required for successful completion of functional activities.  - NuStep level 6 using bilateral  upper and lower extremities. Seat/handle setting 8/10. For improved extremity mobility, muscular endurance, and activity tolerance; and to induce the analgesic effect of aerobic exercise, stimulate improved joint nutrition, and prepare body structures and systems for following interventions. x 5  minutes. Average SPM = 60. Cued to go at least 60 steps per minute, challenged to keep this pace at level 6.  - sit <> stand with TRX from from 12 inch step, 3x15, SBA.  - standing wall push up, 3x10, cuing for position.  - seated rows on OMEGA Machine with scapular retraction for improved postural and shoulder girdle strengthening and mobility. Required instruction for technique and cuing to retract, posteriorly tilt, and depress scapulae. 3x10 at 15#.  - lateral stepping back and forth over small 6 inch aerobic step with B UE support, 2x10 each direction. Working to keep eyes up except for occasional glances. SBA - lateral stepping back and forth over small 4 inch aerobic step with minimal UE support 1x10 each direction. Working to keep balance without using UE, looking down. CGA.   Pt required multimodal cuing for proper technique and to facilitate improved neuromuscular control, strength, range of motion, and functional ability resulting in improved performance and form.  PATIENT EDUCATION:  Education details: Exercise purpose/form. Self management techniques. Education on diagnosis, prognosis, POC, anatomy and physiology  of current condition. Education on HEP including handout. Person educated: Patient Education method: Explanation, Demonstration, Tactile cues, Verbal cues, and Handouts Education comprehension: verbalized understanding, returned demonstration, and needs further education  HOME EXERCISE PROGRAM: Access Code: [email or web address] URL: [email or web address] Date: 12/13/2022 Prepared by: Norton Blizzard  Exercises - Sit to Stand Without Arm Support  - 3-5 x weekly - 3 sets - 5-10 reps - Standing March with Unilateral Counter Support  - 3-5 x weekly - 3 sets - 10 reps - Standing Knee Flexion with Unilateral Counter Support  - 3-5 x weekly - 3 sets - 10 reps - Standing Hip Abduction with Unilateral Counter Support  - 3-5 x weekly - 3 sets - 10 reps - Heel Toe Raises with Unilateral Counter Support  - 3-5 x weekly - 3 sets - 10 reps  Patient Education - Scar Massage  HOME EXERCISE PROGRAM [[email or web address]] View at "[email or web address]" using code: [email or web address] Half Kneel on Padded Chair  -  Repeat 10 Repetitions, Hold 5 Seconds, Complete 2 Sets, Perform 1 Times a Day  ASSESSMENT:  CLINICAL IMPRESSION:  Patient arrives reporting some frustration with having pain and stiffness after prolonged sitting that makes it hard to walk for especially the first few steps after getting up. Today's session continued to work on improving LE and UE strength as well as balance to improve her fall risk and help her be able to get up if she was to fall. Patient found exercises appropriately challenging and was able to complete them without lasting increase in pain. She continues to require skilled cuing and guarding to perform exercises most effectively and safely.  Plan to complete 10th visit progress note next session.  Patient would benefit from continued management of limiting condition by skilled physical therapist to address remaining impairments and functional limitations to work towards stated goals and return to PLOF  or maximal functional independence.    From Initial Evaluation (11/30/2022):  Patient is a 82 y.o. female referred to outpatient physical therapy with a medical diagnosis of right groin pain, acute bilateral low back pain without sciatica, left hip pain, weakness of both legs who presents with signs and symptoms consistent  with generalized weakness and unsteadiness on feet with difficulty with prolonged standing due to low back pain who presents as a fall risk. Patient has experienced recent falls and difficulty getting up from the floor. During her exam she demonstrated posterior bias with difficulty performing 5 Times Sit To Stand without loss of balance backwards or using B UE. She also expresses intolerance to kneeling after having both knees replaced and would benefit from desensitization training to improve her ability to knee so she can garden and get up from the floor more easily. She also has significant loss of hip extension ROM, which likely affects her gait and standing tolerance. Patient is finding it challenging to care for herself and her mobility challenged husband due to her functional limitations and falls. Patient presents with significant pain, ROM, joint stiffness, gait, balance, muscle performance (strength/power/endurance), motor control, allodynia with kneeling, and activity tolerance impairments that are limiting ability to complete kneeling, getting up from the ground, not falling, caring for her husband, stairs, walking, getting up from sitting, bending, lifting, carrying, gardening, cooking, prolonged standing without difficulty. Patient will benefit from skilled physical therapy intervention to address current body structure impairments and activity limitations to improve function and work towards goals set in current POC in order to return to prior level of function or maximal functional improvement.    OBJECTIVE IMPAIRMENTS: Abnormal gait, decreased activity tolerance, decreased  balance, decreased endurance, decreased knowledge of condition, decreased knowledge of use of DME, decreased mobility, difficulty walking, decreased ROM, decreased strength, hypomobility, increased edema, impaired perceived functional ability, impaired flexibility, obesity, and pain.   ACTIVITY LIMITATIONS: carrying, lifting, bending, standing, squatting, stairs, transfers, bed mobility, dressing, locomotion level, and caring for others  PARTICIPATION LIMITATIONS: meal prep, cleaning, laundry, interpersonal relationship, shopping, community activity, yard work, and   difficulty with kneeling, getting up from the ground, not falling, caring for her husband, stairs, walking, getting up from sitting, bending, lifting, carrying, gardening, cooking, prolonged standing  PERSONAL FACTORS: Age, Fitness, Past/current experiences, Time since onset of injury/illness/exacerbation, and 3+ comorbidities:   plantar fasciitis, right foot pain (3 surgeries), osteopenia she has S/P total knee arthroplasty and Diverticulitis large intestine on their problem list. She  has a past medical history of Anemia, Anxiety, Arthritis, Cancer (HCC) (1999), GERD (gastroesophageal reflux disease), History of kidney stones, HOH (hard of hearing), Hypertension, Hypothyroidism, PONV (postoperative nausea and vomiting), and Tinnitus. She  has a past surgical history that includes Hemorroidectomy; Knee arthroscopy (Left); Liposuction; Blepharoplasty; Toe Surgery (Right); Varicose vein surgery; basil cell; Pelvic floor repair; Abdominal hysterectomy; Knee Arthroplasty (Left, 11/14/2016); Knee Arthroplasty (Right, 03/15/2017); total knee replacement (Bilateral, 11/2016); Cystocele repair (2005); Tonsillectomy; Shoulder arthroscopy with debridement and bicep tendon repair (Left, 09/24/2019); Cataract extraction w/PHACO (Left, 08/26/2020); and Cataract extraction w/PHACO (Right, 09/09/2020) are also affecting patient's functional outcome.   REHAB  POTENTIAL: Good  CLINICAL DECISION MAKING: Evolving/moderate complexity  EVALUATION COMPLEXITY: Moderate   GOALS: Goals reviewed with patient? No  SHORT TERM GOALS: Target date: 12/14/2022  Patient will be independent with initial home exercise program for self-management of symptoms. Baseline: Initial HEP provided at IE (11/30/22); Goal status: In-progress   LONG TERM GOALS: Target date: 02/22/2023  Patient will be independent with a long-term home exercise program for self-management of symptoms.  Baseline: Initial HEP to be provided at visit 2 as appropriate (11/30/22); Goal status: In-progress  2.  Patient will demonstrate improved FOTO by equal or greater than 10 points by visit #10 to demonstrate improvement in overall  condition and self-reported functional ability.  Baseline: 56 (11/30/22); Goal status: In-progress  3.  Patient will complete 5 Times Sit To Stand Test in equal or less than 15 seconds from 18.5 inch or less surface with no LOB or use of B UE to improve her ability to get around her home safely and carry items when getting up from a seated position.  Baseline: 23 seconds with frequent LOB backwards including leaning against plinth and one failed attempt. No use of B UE on the plinth. From 18.5 inch plinth (11/30/22); Goal status: In-progress  4.  Patient will demonstrate the ability to complete the Floor Transfer Test in equal or less than 8.8 seconds to improve her ability to get up from the ground when gardening or after a fall.   Baseline: to be tested at a later date (11/30/22); unable to complete floor transfer test without two person assistance (12/13/2022);  Goal status: In-progress  5.  Patient will complete community, work and/or recreational activities with 50% less  limitation due to current condition.  Baseline: difficulty with kneeling, getting up from the ground, not falling, caring for her husband, stairs, walking, getting up from sitting, bending,  lifting, carrying, gardening, cooking, prolonged standing (11/30/22); Goal status: In-progress  6.  Patient will score equal or greater than 25 on the Functional Gait Assessment to demonstrate low fall risk.  Baseline: to be tested at later date as appropriate (11/30/2022);  Goal status: In-progress   PLAN:  PT FREQUENCY: 1-2x/week  PT DURATION: 12 weeks  PLANNED INTERVENTIONS: Therapeutic exercises, Therapeutic activity, Neuromuscular re-education, Balance training, Gait training, Patient/Family education, Self Care, Joint mobilization, Stair training, DME instructions, Dry Needling, Cognitive remediation, Electrical stimulation, Spinal mobilization, Cryotherapy, Moist heat, Manual therapy, and Re-evaluation.  PLAN FOR NEXT SESSION: Update HEP as appropriate, progressive LE/core/functional strengthening and balance exercises. Education. Manual therapy as needed.    Cira Rue, PT,DPT 01/05/2023, 1:48 PM  [facility] [address] [place], [place] [address] P: [phone] I F: [phone]

## 2023-01-11 ENCOUNTER — Ambulatory Visit: Payer: HMO | Attending: Physician Assistant

## 2023-01-11 ENCOUNTER — Encounter: Payer: HMO | Admitting: Physical Therapy

## 2023-01-11 DIAGNOSIS — R2689 Other abnormalities of gait and mobility: Secondary | ICD-10-CM | POA: Diagnosis not present

## 2023-01-11 DIAGNOSIS — M25562 Pain in left knee: Secondary | ICD-10-CM | POA: Diagnosis not present

## 2023-01-11 DIAGNOSIS — M5459 Other low back pain: Secondary | ICD-10-CM | POA: Diagnosis not present

## 2023-01-11 DIAGNOSIS — M6281 Muscle weakness (generalized): Secondary | ICD-10-CM | POA: Insufficient documentation

## 2023-01-11 DIAGNOSIS — M25561 Pain in right knee: Secondary | ICD-10-CM | POA: Insufficient documentation

## 2023-01-11 DIAGNOSIS — G8929 Other chronic pain: Secondary | ICD-10-CM | POA: Insufficient documentation

## 2023-01-11 DIAGNOSIS — Z9181 History of falling: Secondary | ICD-10-CM | POA: Insufficient documentation

## 2023-01-11 NOTE — Therapy (Signed)
OUTPATIENT PHYSICAL THERAPY TREATMENT Physical Therapy Progress Note   Dates of reporting period  11/30/22   to   01/11/23    Patient Name: Mary Elliott MRN: [id number] DOB:Nov 29, 1940, 82 y.o., female Today's Date: 01/11/2023  END OF SESSION:  PT End of Session - 01/11/23 1037     Visit Number 10    Number of Visits 13    Date for PT Re-Evaluation 02/22/23    Authorization Type [employer] reporting period from 11/30/2022    Authorization Time Period 11/30/22-02/22/23    Progress Note Due on Visit 10    PT Start Time 1030    PT Stop Time 1110    PT Time Calculation (min) 40 min    Activity Tolerance Patient tolerated treatment well;No increased pain    Behavior During Therapy WFL for tasks assessed/performed             Past Medical History:  Diagnosis Date   Anemia    Anxiety    Arthritis    fingers   Cancer (HCC) 1999   basil cell on face   GERD (gastroesophageal reflux disease)    history of   History of kidney stones    twice   HOH (hard of hearing)    Hypertension    Hypothyroidism    PONV (postoperative nausea and vomiting)    nausea only with 2 prior surgeries   Tinnitus    Past Surgical History:  Procedure Laterality Date   ABDOMINAL HYSTERECTOMY     basil cell     face   BLEPHAROPLASTY     CATARACT EXTRACTION W/PHACO Left 08/26/2020   Procedure: CATARACT EXTRACTION PHACO AND INTRAOCULAR LENS PLACEMENT (IOC) LEFT PANOPTIX TORIC;  Surgeon: Lockie Mola, MD;  Location: [facility];  Service: Ophthalmology;  Laterality: Left;  8.88 1.:27.2 10.1%   CATARACT EXTRACTION W/PHACO Right 09/09/2020   Procedure: CATARACT EXTRACTION PHACO AND INTRAOCULAR LENS PLACEMENT (IOC) RIGHT PANOPTIX TORIC 8.54 01:03.3 13.5%;  Surgeon: Lockie Mola, MD;  Location: [facility];  Service: Ophthalmology;  Laterality: Right;   CYSTOCELE REPAIR  2005   HEMORROIDECTOMY     JOINT REPLACEMENT Bilateral 11/2016   KNEE ARTHROPLASTY Left  11/14/2016   Procedure: COMPUTER ASSISTED TOTAL KNEE ARTHROPLASTY;  Surgeon: Donato Heinz, MD;  Location: [facility];  Service: Orthopedics;  Laterality: Left;   KNEE ARTHROPLASTY Right 03/15/2017   Procedure: COMPUTER ASSISTED TOTAL KNEE ARTHROPLASTY;  Surgeon: Donato Heinz, MD;  Location: [facility];  Service: Orthopedics;  Laterality: Right;   KNEE ARTHROSCOPY Left    LIPOSUCTION     PELVIC FLOOR REPAIR     SHOULDER ARTHROSCOPY WITH DEBRIDEMENT AND BICEP TENDON REPAIR Left 09/24/2019   Procedure: Left shoulder arthroscopy with debridement, decompression, rotator cuff repair, and possible biceps tenodesis.;  Surgeon: Christena Flake, MD;  Location: [facility];  Service: Orthopedics;  Laterality: Left;   TOE SURGERY Right    TONSILLECTOMY     VARICOSE VEIN SURGERY     Patient Active Problem List   Diagnosis Date Noted   Diverticulitis large intestine 05/08/2022   S/P total knee arthroplasty 11/14/2016    PCP: Lynnea Ferrier, MD  REFERRING PROVIDER: Dayton Bailiff, PA  REFERRING DIAG: right groin pain, acute bilateral low back pain without sciatica, left hip pain, weakness of both legs  Rationale for Evaluation and Treatment: Rehabilitation  THERAPY DIAG:  Other low back pain  Other abnormalities of gait and mobility  Chronic pain of both knees  History of falling  Muscle weakness (generalized)  ONSET DATE: possibly chronic but current exacerbation started approximately January 2024  PERTINENT HISTORY:  Patient is a 82 y.o. female who presents to outpatient physical therapy with a referral for medical diagnosis right groin pain, acute bilateral low back pain without sciatica, left hip pain, weakness of both legs. This patient's chief complaints consist of difficulty with mobility, unsteadiness on her feet, falls, low back pain with prolonged standing, and anterior knee pain with kneeling, leading to the following functional deficits: difficulty with kneeling, getting up from  the ground, not falling, caring for her husband, stairs, walking, getting up from sitting, bending, lifting, carrying, gardening, cooking, prolonged standing. Relevant past medical history and comorbidities include plantar fasciitis, right foot pain (3 surgeries), osteopenia she has S/P total knee arthroplasty and Diverticulitis large intestine on their problem list. She  has a past medical history of Anemia, Anxiety, Arthritis, Cancer (HCC) (1999), GERD (gastroesophageal reflux disease), History of kidney stones, HOH (hard of hearing), Hypertension, Hypothyroidism, PONV (postoperative nausea and vomiting), and Tinnitus. She  has a past surgical history that includes Hemorroidectomy; Knee arthroscopy (Left); Liposuction; Blepharoplasty; Toe Surgery (Right); Varicose vein surgery; basil cell; Pelvic floor repair; Abdominal hysterectomy; Knee Arthroplasty (Left, 11/14/2016); Knee Arthroplasty (Right, 03/15/2017); total knee replacement (Bilateral, 11/2016); Cystocele repair (2005); Tonsillectomy; Shoulder arthroscopy with debridement and bicep tendon repair (Left, 09/24/2019); Cataract extraction w/PHACO (Left, 08/26/2020); and Cataract extraction w/PHACO (Right, 09/09/2020). Patient denies hx of stroke, seizures, lung problems, heart problems, diabetes, unexplained weight loss, unexplained changes in bowel or bladder problems, and spinal surgery  SUBJECTIVE:                                                                                                                                                                                           SUBJECTIVE STATEMENT: Pt denies any significant updates since last PT session. Pt reports 'she is here' when asked how she is doing today. Pt makes eye contact is interactive, pleasant. Pt reports she was quite sore following her prior PT session.   PAIN:  "Typical aches and pains"  PRECAUTIONS: Fall  PATIENT GOALS: "stay on my feet" "be able to get up out of a chair" improve  her core strength  OBJECTIVE  TODAY'S TREATMENT:     -Nustep arms 10, seat 8, level 6 x 5 minutes -FOTO survey -5xSTS (performed twice)  -FGA balance/gait analysis  -normal stance to tandem to normal stance 2x10 bilat  PATIENT EDUCATION:  Education details: No extensive education this session, mostly focussed on reassessment   HOME EXERCISE PROGRAM: Access Code: [email or web address] URL: [email or web address] Date: 12/13/2022 Prepared by:  Norton Blizzard  Exercises - Sit to Stand Without Arm Support  - 3-5 x weekly - 3 sets - 5-10 reps - Standing March with Unilateral Counter Support  - 3-5 x weekly - 3 sets - 10 reps - Standing Knee Flexion with Unilateral Counter Support  - 3-5 x weekly - 3 sets - 10 reps - Standing Hip Abduction with Unilateral Counter Support  - 3-5 x weekly - 3 sets - 10 reps - Heel Toe Raises with Unilateral Counter Support  - 3-5 x weekly - 3 sets - 10 reps  Patient Education - Scar Massage  HOME EXERCISE PROGRAM [[email or web address]] View at [email or web address]" using code: [email or web address] Half Kneel on Padded Chair  -  Repeat 10 Repetitions, Hold 5 Seconds, Complete 2 Sets, Perform 1 Times a Day  ASSESSMENT:  CLINICAL IMPRESSION:  Reassessment performed this date. FOTO survey score is significantly worse, however pt denies any specific feelings of a decline in confidence since beginning. 5xSTS is significantly improved, but not at goal. FGA continues to reveal significant gait deviations. Pt remains motivated to improve her steadiness for ful participation in all of her meaningful activities. Patient would benefit from continued management of limiting condition by skilled physical therapist to address remaining impairments and functional limitations to work towards stated goals and return to PLOF or maximal functional independence.     OBJECTIVE IMPAIRMENTS: Abnormal gait, decreased activity tolerance, decreased balance, decreased endurance, decreased knowledge of  condition, decreased knowledge of use of DME, decreased mobility, difficulty walking, decreased ROM, decreased strength, hypomobility, increased edema, impaired perceived functional ability, impaired flexibility, obesity, and pain.   ACTIVITY LIMITATIONS: carrying, lifting, bending, standing, squatting, stairs, transfers, bed mobility, dressing, locomotion level, and caring for others  PARTICIPATION LIMITATIONS: meal prep, cleaning, laundry, interpersonal relationship, shopping, community activity, yard work, and   difficulty with kneeling, getting up from the ground, not falling, caring for her husband, stairs, walking, getting up from sitting, bending, lifting, carrying, gardening, cooking, prolonged standing  PERSONAL FACTORS: Age, Fitness, Past/current experiences, Time since onset of injury/illness/exacerbation, and 3+ comorbidities:   plantar fasciitis, right foot pain (3 surgeries), osteopenia she has S/P total knee arthroplasty and Diverticulitis large intestine on their problem list. She  has a past medical history of Anemia, Anxiety, Arthritis, Cancer (HCC) (1999), GERD (gastroesophageal reflux disease), History of kidney stones, HOH (hard of hearing), Hypertension, Hypothyroidism, PONV (postoperative nausea and vomiting), and Tinnitus. She  has a past surgical history that includes Hemorroidectomy; Knee arthroscopy (Left); Liposuction; Blepharoplasty; Toe Surgery (Right); Varicose vein surgery; basil cell; Pelvic floor repair; Abdominal hysterectomy; Knee Arthroplasty (Left, 11/14/2016); Knee Arthroplasty (Right, 03/15/2017); total knee replacement (Bilateral, 11/2016); Cystocele repair (2005); Tonsillectomy; Shoulder arthroscopy with debridement and bicep tendon repair (Left, 09/24/2019); Cataract extraction w/PHACO (Left, 08/26/2020); and Cataract extraction w/PHACO (Right, 09/09/2020) are also affecting patient's functional outcome.   REHAB POTENTIAL: Good  CLINICAL DECISION MAKING:  Evolving/moderate complexity  EVALUATION COMPLEXITY: Moderate   GOALS: Goals reviewed with patient? No  SHORT TERM GOALS: Target date: 12/14/2022  Patient will be independent with initial home exercise program for self-management of symptoms. Baseline: Initial HEP provided at IE (11/30/22); Goal status: In-progress   LONG TERM GOALS: Target date: 02/22/2023  Patient will be independent with a long-term home exercise program for self-management of symptoms.  Baseline: Initial HEP provided, longterm program still pending  Goal status: In-progress  2.  Patient will demonstrate improved FOTO by equal or greater than 10 points by visit #10 to demonstrate improvement in overall condition  and self-reported functional ability.  Baseline: 56 (11/30/22); 01/11/23: 51 (pt denies feeling her balance has gotten worse) Goal status: Not Met   3.  Patient will complete 5 Times Sit To Stand Test in equal or less than 15 seconds from 18.5 inch or less surface with no LOB or use of B UE to improve her ability to get around her home safely and carry items when getting up from a seated position.  Baseline: 23 seconds with frequent LOB backwards including leaning against plinth and one failed attempt. No use of B UE on the plinth. From 18.5 inch plinth (11/30/22); 01/11/23:17sec from 18.5" chair height hands free Goal status: In-progress  4.  Patient will demonstrate the ability to complete the Floor Transfer Test in equal or less than 8.8 seconds to improve her ability to get up from the ground when gardening or after a fall.   Baseline: to be tested at a later date (11/30/22); unable to complete floor transfer test without two person assistance (12/13/2022); 9/4: did not test, has not been a targeted intervention as of yet   Goal status: In-progress  5.  Patient will complete community, work and/or recreational activities with 50% less limitation due to current condition.  Baseline: difficulty with kneeling,  getting up from the ground, not falling, caring for her husband, stairs, walking, getting up from sitting, bending, lifting, carrying, gardening, cooking, prolonged standing (11/30/22);01/11/23: pt reports about the same, no big change here  Goal status: In-progress  6.  Patient will score equal or greater than 25 on the Functional Gait Assessment to demonstrate low fall risk.  Baseline: to be tested at later date as appropriate (11/30/2022); 01/11/23: FGA =19 Goal status: In-progress   PLAN:  PT FREQUENCY: 1-2x/week  PT DURATION: 12 weeks  PLANNED INTERVENTIONS: Therapeutic exercises, Therapeutic activity, Neuromuscular re-education, Balance training, Gait training, Patient/Family education, Self Care, Joint mobilization, Stair training, DME instructions, Dry Needling, Cognitive remediation, Electrical stimulation, Spinal mobilization, Cryotherapy, Moist heat, Manual therapy, and Re-evaluation.  PLAN FOR NEXT SESSION: Update HEP as appropriate, progressive LE/core/functional strengthening and balance exercises. Education. Manual therapy as needed.    Marlon Vonruden C, PT,DPT 01/11/2023, 11:12 AM  [facility] [address] [place], [place] [address] P: [phone] I F: [phone]

## 2023-01-17 ENCOUNTER — Ambulatory Visit: Payer: HMO | Admitting: Physical Therapy

## 2023-01-18 ENCOUNTER — Encounter: Payer: Self-pay | Admitting: Physical Therapy

## 2023-01-18 ENCOUNTER — Ambulatory Visit: Payer: HMO | Admitting: Physical Therapy

## 2023-01-18 DIAGNOSIS — M5459 Other low back pain: Secondary | ICD-10-CM | POA: Diagnosis not present

## 2023-01-18 DIAGNOSIS — R2689 Other abnormalities of gait and mobility: Secondary | ICD-10-CM

## 2023-01-18 DIAGNOSIS — Z9181 History of falling: Secondary | ICD-10-CM

## 2023-01-18 DIAGNOSIS — M6281 Muscle weakness (generalized): Secondary | ICD-10-CM

## 2023-01-18 DIAGNOSIS — G8929 Other chronic pain: Secondary | ICD-10-CM

## 2023-01-18 NOTE — Therapy (Signed)
OUTPATIENT PHYSICAL THERAPY TREATMENT    Patient Name: Mary Elliott MRN: [id number] DOB:[birth year], 82 y.o., female Today's Date: 01/18/2023  END OF SESSION:  PT End of Session - 01/18/23 1306     Visit Number 11    Number of Visits 13    Date for PT Re-Evaluation 02/22/23    Authorization Type [employer] reporting period from 01/11/2023    Authorization Time Period --    Progress Note Due on Visit 20    PT Start Time 1305    PT Stop Time 1348    PT Time Calculation (min) 43 min    Activity Tolerance Patient tolerated treatment well;No increased pain    Behavior During Therapy WFL for tasks assessed/performed              Past Medical History:  Diagnosis Date   Anemia    Anxiety    Arthritis    fingers   Cancer (HCC) 1999   basil cell on face   GERD (gastroesophageal reflux disease)    history of   History of kidney stones    twice   HOH (hard of hearing)    Hypertension    Hypothyroidism    PONV (postoperative nausea and vomiting)    nausea only with 2 prior surgeries   Tinnitus    Past Surgical History:  Procedure Laterality Date   ABDOMINAL HYSTERECTOMY     basil cell     face   BLEPHAROPLASTY     CATARACT EXTRACTION W/PHACO Left 08/26/2020   Procedure: CATARACT EXTRACTION PHACO AND INTRAOCULAR LENS PLACEMENT (IOC) LEFT PANOPTIX TORIC;  Surgeon: Lockie Mola, MD;  Location: [facility];  Service: Ophthalmology;  Laterality: Left;  8.88 1.:27.2 10.1%   CATARACT EXTRACTION W/PHACO Right 09/09/2020   Procedure: CATARACT EXTRACTION PHACO AND INTRAOCULAR LENS PLACEMENT (IOC) RIGHT PANOPTIX TORIC 8.54 01:03.3 13.5%;  Surgeon: Lockie Mola, MD;  Location: [facility];  Service: Ophthalmology;  Laterality: Right;   CYSTOCELE REPAIR  2005   HEMORROIDECTOMY     JOINT REPLACEMENT Bilateral 11/2016   KNEE ARTHROPLASTY Left 11/14/2016   Procedure: COMPUTER ASSISTED TOTAL KNEE ARTHROPLASTY;  Surgeon: Donato Heinz, MD;   Location: [facility];  Service: Orthopedics;  Laterality: Left;   KNEE ARTHROPLASTY Right 03/15/2017   Procedure: COMPUTER ASSISTED TOTAL KNEE ARTHROPLASTY;  Surgeon: Donato Heinz, MD;  Location: [facility];  Service: Orthopedics;  Laterality: Right;   KNEE ARTHROSCOPY Left    LIPOSUCTION     PELVIC FLOOR REPAIR     SHOULDER ARTHROSCOPY WITH DEBRIDEMENT AND BICEP TENDON REPAIR Left 09/24/2019   Procedure: Left shoulder arthroscopy with debridement, decompression, rotator cuff repair, and possible biceps tenodesis.;  Surgeon: Christena Flake, MD;  Location: [facility];  Service: Orthopedics;  Laterality: Left;   TOE SURGERY Right    TONSILLECTOMY     VARICOSE VEIN SURGERY     Patient Active Problem List   Diagnosis Date Noted   Diverticulitis large intestine 05/08/2022   S/P total knee arthroplasty 11/14/2016    PCP: Lynnea Ferrier, MD  REFERRING PROVIDER: Dayton Bailiff, PA  REFERRING DIAG: right groin pain, acute bilateral low back pain without sciatica, left hip pain, weakness of both legs  Rationale for Evaluation and Treatment: Rehabilitation  THERAPY DIAG:  Other low back pain  Other abnormalities of gait and mobility  Chronic pain of both knees  History of falling  Muscle weakness (generalized)  ONSET DATE: possibly chronic but current exacerbation started approximately  January 2024  PERTINENT HISTORY:  Patient is a 82 y.o. female who presents to outpatient physical therapy with a referral for medical diagnosis right groin pain, acute bilateral low back pain without sciatica, left hip pain, weakness of both legs. This patient's chief complaints consist of difficulty with mobility, unsteadiness on her feet, falls, low back pain with prolonged standing, and anterior knee pain with kneeling, leading to the following functional deficits: difficulty with kneeling, getting up from the ground, not falling, caring for her husband, stairs, walking, getting up from sitting,  bending, lifting, carrying, gardening, cooking, prolonged standing. Relevant past medical history and comorbidities include plantar fasciitis, right foot pain (3 surgeries), osteopenia she has S/P total knee arthroplasty and Diverticulitis large intestine on their problem list. She  has a past medical history of Anemia, Anxiety, Arthritis, Cancer (HCC) (1999), GERD (gastroesophageal reflux disease), History of kidney stones, HOH (hard of hearing), Hypertension, Hypothyroidism, PONV (postoperative nausea and vomiting), and Tinnitus. She  has a past surgical history that includes Hemorroidectomy; Knee arthroscopy (Left); Liposuction; Blepharoplasty; Toe Surgery (Right); Varicose vein surgery; basil cell; Pelvic floor repair; Abdominal hysterectomy; Knee Arthroplasty (Left, 11/14/2016); Knee Arthroplasty (Right, 03/15/2017); total knee replacement (Bilateral, 11/2016); Cystocele repair (2005); Tonsillectomy; Shoulder arthroscopy with debridement and bicep tendon repair (Left, 09/24/2019); Cataract extraction w/PHACO (Left, 08/26/2020); and Cataract extraction w/PHACO (Right, 09/09/2020). Patient denies hx of stroke, seizures, lung problems, heart problems, diabetes, unexplained weight loss, unexplained changes in bowel or bladder problems, and spinal surgery  SUBJECTIVE:                                                                                                                                                                                           SUBJECTIVE STATEMENT: Patient reports she is feeling okay today. She is concerned about her answers on the FOTO questionnaire last visit.   PAIN:  Pain at left low back.   PRECAUTIONS: Fall  PATIENT GOALS: "stay on my feet" "be able to get up out of a chair" improve her core strength  OBJECTIVE  TODAY'S TREATMENT:     Therapeutic exercise: to centralize symptoms and improve ROM, strength, muscular endurance, and activity tolerance required for successful  completion of functional activities.  - Treadmill 1.5 mph at 3% grade with B UE support. For improved lower extremity mobility, muscular endurance, and weightbearing activity tolerance; and to induce the analgesic effect of aerobic exercise, stimulate improved joint nutrition, and prepare body structures and systems for following interventions. x 5  minutes. Required assistance to operate treadmill and SBA for safety. - sled push as fast as possible, 6x30 feet with short standing breaks while  PT assists with turning sled, B UE support on upright poles, 75# sled only. Cuing for extended elbows, chest down, and trying to run while pushing.  (Seated rest) - standing cone tip/right, 1x10 each side with seated rest between sets. CGA. (Provided cone to practice at home).   - sit <> stand with TRX from from 12 inch step, 3x15, SBA. Cuing to keep heels back to improve quad stimulus). 30-60 second standing rest between sets.    Pt required multimodal cuing for proper technique and to facilitate improved neuromuscular control, strength, range of motion, and functional ability resulting in improved performance and form.  PATIENT EDUCATION:  Education details: No extensive education this session, mostly focussed on reassessment   HOME EXERCISE PROGRAM: Access Code: [email or web address] URL: [email or web address] Date: 12/13/2022 Prepared by: Norton Blizzard  Exercises - Sit to Stand Without Arm Support  - 3-5 x weekly - 3 sets - 5-10 reps - Standing March with Unilateral Counter Support  - 3-5 x weekly - 3 sets - 10 reps - Standing Knee Flexion with Unilateral Counter Support  - 3-5 x weekly - 3 sets - 10 reps - Standing Hip Abduction with Unilateral Counter Support  - 3-5 x weekly - 3 sets - 10 reps - Heel Toe Raises with Unilateral Counter Support  - 3-5 x weekly - 3 sets - 10 reps  Patient Education - Scar Massage  HOME EXERCISE PROGRAM [[email or web address]] View at "[email or web address]" using code:  [email or web address] Half Kneel on Padded Chair  -  Repeat 10 Repetitions, Hold 5 Seconds, Complete 2 Sets, Perform 1 Times a Day  ASSESSMENT:  CLINICAL IMPRESSION:  Patient arrives with some concerns about scoring lower on the FOTO balance questionnaire last session. Today's session incorporated more upright and balance activities to help improve balance and functional strength. Patient had some low back pain with treadmill that improved afterwards. Sled was used to improve LE power and patient was unable to move fast enough to reach a run. Overall she tolerated session well with appropriate fatigue. Patient would benefit from continued management of limiting condition by skilled physical therapist to address remaining impairments and functional limitations to work towards stated goals and return to PLOF or maximal functional independence.   OBJECTIVE IMPAIRMENTS: Abnormal gait, decreased activity tolerance, decreased balance, decreased endurance, decreased knowledge of condition, decreased knowledge of use of DME, decreased mobility, difficulty walking, decreased ROM, decreased strength, hypomobility, increased edema, impaired perceived functional ability, impaired flexibility, obesity, and pain.   ACTIVITY LIMITATIONS: carrying, lifting, bending, standing, squatting, stairs, transfers, bed mobility, dressing, locomotion level, and caring for others  PARTICIPATION LIMITATIONS: meal prep, cleaning, laundry, interpersonal relationship, shopping, community activity, yard work, and   difficulty with kneeling, getting up from the ground, not falling, caring for her husband, stairs, walking, getting up from sitting, bending, lifting, carrying, gardening, cooking, prolonged standing  PERSONAL FACTORS: Age, Fitness, Past/current experiences, Time since onset of injury/illness/exacerbation, and 3+ comorbidities:   plantar fasciitis, right foot pain (3 surgeries), osteopenia she has S/P total knee arthroplasty and  Diverticulitis large intestine on their problem list. She  has a past medical history of Anemia, Anxiety, Arthritis, Cancer (HCC) (1999), GERD (gastroesophageal reflux disease), History of kidney stones, HOH (hard of hearing), Hypertension, Hypothyroidism, PONV (postoperative nausea and vomiting), and Tinnitus. She  has a past surgical history that includes Hemorroidectomy; Knee arthroscopy (Left); Liposuction; Blepharoplasty; Toe Surgery (Right); Varicose vein surgery; basil cell; Pelvic floor repair; Abdominal hysterectomy; Knee Arthroplasty (Left, 11/14/2016); Knee Arthroplasty (Right, 03/15/2017); total knee  replacement (Bilateral, 11/2016); Cystocele repair (2005); Tonsillectomy; Shoulder arthroscopy with debridement and bicep tendon repair (Left, 09/24/2019); Cataract extraction w/PHACO (Left, 08/26/2020); and Cataract extraction w/PHACO (Right, 09/09/2020) are also affecting patient's functional outcome.   REHAB POTENTIAL: Good  CLINICAL DECISION MAKING: Evolving/moderate complexity  EVALUATION COMPLEXITY: Moderate   GOALS: Goals reviewed with patient? No  SHORT TERM GOALS: Target date: 12/14/2022  Patient will be independent with initial home exercise program for self-management of symptoms. Baseline: Initial HEP provided at IE (11/30/22); Goal status: In-progress   LONG TERM GOALS: Target date: 02/22/2023  Patient will be independent with a long-term home exercise program for self-management of symptoms.  Baseline: Initial HEP provided, longterm program still pending  Goal status: In-progress  2.  Patient will demonstrate improved FOTO by equal or greater than 10 points by visit #10 to demonstrate improvement in overall condition and self-reported functional ability.  Baseline: 56 (11/30/22); 01/11/23: 51 (pt denies feeling her balance has gotten worse) Goal status: Not Met   3.  Patient will complete 5 Times Sit To Stand Test in equal or less than 15 seconds from 18.5 inch or less surface  with no LOB or use of B UE to improve her ability to get around her home safely and carry items when getting up from a seated position.  Baseline: 23 seconds with frequent LOB backwards including leaning against plinth and one failed attempt. No use of B UE on the plinth. From 18.5 inch plinth (11/30/22); 01/11/23:17sec from 18.5" chair height hands free Goal status: In-progress  4.  Patient will demonstrate the ability to complete the Floor Transfer Test in equal or less than 8.8 seconds to improve her ability to get up from the ground when gardening or after a fall.   Baseline: to be tested at a later date (11/30/22); unable to complete floor transfer test without two person assistance (12/13/2022); 9/4: did not test, has not been a targeted intervention as of yet   Goal status: In-progress  5.  Patient will complete community, work and/or recreational activities with 50% less limitation due to current condition.  Baseline: difficulty with kneeling, getting up from the ground, not falling, caring for her husband, stairs, walking, getting up from sitting, bending, lifting, carrying, gardening, cooking, prolonged standing (11/30/22);01/11/23: pt reports about the same, no big change here  Goal status: In-progress  6.  Patient will score equal or greater than 25 on the Functional Gait Assessment to demonstrate low fall risk.  Baseline: to be tested at later date as appropriate (11/30/2022); 01/11/23: FGA =19 Goal status: In-progress   PLAN:  PT FREQUENCY: 1-2x/week  PT DURATION: 12 weeks  PLANNED INTERVENTIONS: Therapeutic exercises, Therapeutic activity, Neuromuscular re-education, Balance training, Gait training, Patient/Family education, Self Care, Joint mobilization, Stair training, DME instructions, Dry Needling, Cognitive remediation, Electrical stimulation, Spinal mobilization, Cryotherapy, Moist heat, Manual therapy, and Re-evaluation.  PLAN FOR NEXT SESSION: Update HEP as appropriate,  progressive LE/core/functional strengthening and balance exercises. Education. Manual therapy as needed.    Cira Rue, PT,DPT 01/18/2023, 1:52 PM  [facility] [address] [place], [place] [address] P: [phone] I F: [phone]

## 2023-01-19 ENCOUNTER — Encounter: Payer: Self-pay | Admitting: Physical Therapy

## 2023-01-19 ENCOUNTER — Ambulatory Visit: Payer: HMO | Admitting: Physical Therapy

## 2023-01-19 DIAGNOSIS — M6281 Muscle weakness (generalized): Secondary | ICD-10-CM

## 2023-01-19 DIAGNOSIS — G8929 Other chronic pain: Secondary | ICD-10-CM

## 2023-01-19 DIAGNOSIS — R2689 Other abnormalities of gait and mobility: Secondary | ICD-10-CM

## 2023-01-19 DIAGNOSIS — M5459 Other low back pain: Secondary | ICD-10-CM

## 2023-01-19 DIAGNOSIS — Z9181 History of falling: Secondary | ICD-10-CM

## 2023-01-19 NOTE — Therapy (Signed)
OUTPATIENT PHYSICAL THERAPY TREATMENT    Patient Name: Mary Elliott MRN: [id number] DOB:11-17-1940, 82 y.o., female Today's Date: 01/19/2023  END OF SESSION:  PT End of Session - 01/19/23 1435     Visit Number 12    Number of Visits 24    Date for PT Re-Evaluation 02/22/23    Authorization Type [employer] reporting period from 01/11/2023    Progress Note Due on Visit 20    PT Start Time 1120    PT Stop Time 1200    PT Time Calculation (min) 40 min    Activity Tolerance Patient tolerated treatment well;No increased pain    Behavior During Therapy WFL for tasks assessed/performed             Past Medical History:  Diagnosis Date   Anemia    Anxiety    Arthritis    fingers   Cancer (HCC) 1999   basil cell on face   GERD (gastroesophageal reflux disease)    history of   History of kidney stones    twice   HOH (hard of hearing)    Hypertension    Hypothyroidism    PONV (postoperative nausea and vomiting)    nausea only with 2 prior surgeries   Tinnitus    Past Surgical History:  Procedure Laterality Date   ABDOMINAL HYSTERECTOMY     basil cell     face   BLEPHAROPLASTY     CATARACT EXTRACTION W/PHACO Left 08/26/2020   Procedure: CATARACT EXTRACTION PHACO AND INTRAOCULAR LENS PLACEMENT (IOC) LEFT PANOPTIX TORIC;  Surgeon: Lockie Mola, MD;  Location: [facility];  Service: Ophthalmology;  Laterality: Left;  8.88 1.:27.2 10.1%   CATARACT EXTRACTION W/PHACO Right 09/09/2020   Procedure: CATARACT EXTRACTION PHACO AND INTRAOCULAR LENS PLACEMENT (IOC) RIGHT PANOPTIX TORIC 8.54 01:03.3 13.5%;  Surgeon: Lockie Mola, MD;  Location: [facility];  Service: Ophthalmology;  Laterality: Right;   CYSTOCELE REPAIR  2005   HEMORROIDECTOMY     JOINT REPLACEMENT Bilateral 11/2016   KNEE ARTHROPLASTY Left 11/14/2016   Procedure: COMPUTER ASSISTED TOTAL KNEE ARTHROPLASTY;  Surgeon: Donato Heinz, MD;  Location: [facility];  Service:  Orthopedics;  Laterality: Left;   KNEE ARTHROPLASTY Right 03/15/2017   Procedure: COMPUTER ASSISTED TOTAL KNEE ARTHROPLASTY;  Surgeon: Donato Heinz, MD;  Location: [facility];  Service: Orthopedics;  Laterality: Right;   KNEE ARTHROSCOPY Left    LIPOSUCTION     PELVIC FLOOR REPAIR     SHOULDER ARTHROSCOPY WITH DEBRIDEMENT AND BICEP TENDON REPAIR Left 09/24/2019   Procedure: Left shoulder arthroscopy with debridement, decompression, rotator cuff repair, and possible biceps tenodesis.;  Surgeon: Christena Flake, MD;  Location: [facility];  Service: Orthopedics;  Laterality: Left;   TOE SURGERY Right    TONSILLECTOMY     VARICOSE VEIN SURGERY     Patient Active Problem List   Diagnosis Date Noted   Diverticulitis large intestine 05/08/2022   S/P total knee arthroplasty 11/14/2016    PCP: Lynnea Ferrier, MD  REFERRING PROVIDER: Dayton Bailiff, PA  REFERRING DIAG: right groin pain, acute bilateral low back pain without sciatica, left hip pain, weakness of both legs  Rationale for Evaluation and Treatment: Rehabilitation  THERAPY DIAG:  Other low back pain  Other abnormalities of gait and mobility  Chronic pain of both knees  History of falling  Muscle weakness (generalized)  ONSET DATE: possibly chronic but current exacerbation started approximately January 2024  PERTINENT HISTORY:  Patient is  a 82 y.o. female who presents to outpatient physical therapy with a referral for medical diagnosis right groin pain, acute bilateral low back pain without sciatica, left hip pain, weakness of both legs. This patient's chief complaints consist of difficulty with mobility, unsteadiness on her feet, falls, low back pain with prolonged standing, and anterior knee pain with kneeling, leading to the following functional deficits: difficulty with kneeling, getting up from the ground, not falling, caring for her husband, stairs, walking, getting up from sitting, bending, lifting, carrying,  gardening, cooking, prolonged standing. Relevant past medical history and comorbidities include plantar fasciitis, right foot pain (3 surgeries), osteopenia she has S/P total knee arthroplasty and Diverticulitis large intestine on their problem list. She  has a past medical history of Anemia, Anxiety, Arthritis, Cancer (HCC) (1999), GERD (gastroesophageal reflux disease), History of kidney stones, HOH (hard of hearing), Hypertension, Hypothyroidism, PONV (postoperative nausea and vomiting), and Tinnitus. She  has a past surgical history that includes Hemorroidectomy; Knee arthroscopy (Left); Liposuction; Blepharoplasty; Toe Surgery (Right); Varicose vein surgery; basil cell; Pelvic floor repair; Abdominal hysterectomy; Knee Arthroplasty (Left, 11/14/2016); Knee Arthroplasty (Right, 03/15/2017); total knee replacement (Bilateral, 11/2016); Cystocele repair (2005); Tonsillectomy; Shoulder arthroscopy with debridement and bicep tendon repair (Left, 09/24/2019); Cataract extraction w/PHACO (Left, 08/26/2020); and Cataract extraction w/PHACO (Right, 09/09/2020). Patient denies hx of stroke, seizures, lung problems, heart problems, diabetes, unexplained weight loss, unexplained changes in bowel or bladder problems, and spinal surgery  SUBJECTIVE:                                                                                                                                                                                           SUBJECTIVE STATEMENT: Patient reports she is feeling well today and is not sore from PT yesterday.   PAIN:  NPRS: 0.5-1/10 across low back  PRECAUTIONS: Fall  PATIENT GOALS: "stay on my feet" "be able to get up out of a chair" improve her core strength  OBJECTIVE  TODAY'S TREATMENT:     Neuromuscular Re-education: to improve, balance, postural strength, muscle activation patterns, and stabilization strength required for functional activities: - lateral stepping back and forth over  small 6-inch aerobic step, CGA-min A to prevent falls. 2x10 each direction.  - step standing with foot on flat side of large half-spikeball, pallof press with single YTB (loop too difficult). CGA-min A to prevent falls. 2x10 each direction with each foot on half-ball.  - lateral stepping on 10 foot airex beam, 1x5 each direction, CGA-minA, LOB frequently backwards.   Therapeutic exercise: to centralize symptoms and improve ROM, strength, muscular endurance, and activity tolerance required for successful completion of functional  activities.  - sled push as fast as possible, 6x30 feet with short standing breaks while PT assists with turning sled, B UE support on upright poles, 75# sled only. Cuing for extended elbows, chest down, and trying to run while pushing.    Pt required multimodal cuing for proper technique and to facilitate improved neuromuscular control, strength, range of motion, and functional ability resulting in improved performance and form.  PATIENT EDUCATION:  Education details: No extensive education this session, mostly focussed on reassessment   HOME EXERCISE PROGRAM: Access Code: [email or web address] URL: [email or web address] Date: 12/13/2022 Prepared by: Norton Blizzard  Exercises - Sit to Stand Without Arm Support  - 3-5 x weekly - 3 sets - 5-10 reps - Standing March with Unilateral Counter Support  - 3-5 x weekly - 3 sets - 10 reps - Standing Knee Flexion with Unilateral Counter Support  - 3-5 x weekly - 3 sets - 10 reps - Standing Hip Abduction with Unilateral Counter Support  - 3-5 x weekly - 3 sets - 10 reps - Heel Toe Raises with Unilateral Counter Support  - 3-5 x weekly - 3 sets - 10 reps  Patient Education - Scar Massage  HOME EXERCISE PROGRAM [[email or web address]] View at [email or web address]" using code: [email or web address] Half Kneel on Padded Chair  -  Repeat 10 Repetitions, Hold 5 Seconds, Complete 2 Sets, Perform 1 Times a Day  ASSESSMENT:  CLINICAL IMPRESSION:  Patient  arrives reporting good tolerance to yesterday's PT session. Today's session focused more on balance-specific exercises. Patient required extra time to complete exercises that she felt off balance with due to slowness of movement to prevent imbalance. She continues to participate well and be motivated to improve. She needed CGA-minA to prevent falls but also used stepping strategy frequently to recover from LOB. Patient would benefit from continued management of limiting condition by skilled physical therapist to address remaining impairments and functional limitations to work towards stated goals and return to PLOF or maximal functional independence.    OBJECTIVE IMPAIRMENTS: Abnormal gait, decreased activity tolerance, decreased balance, decreased endurance, decreased knowledge of condition, decreased knowledge of use of DME, decreased mobility, difficulty walking, decreased ROM, decreased strength, hypomobility, increased edema, impaired perceived functional ability, impaired flexibility, obesity, and pain.   ACTIVITY LIMITATIONS: carrying, lifting, bending, standing, squatting, stairs, transfers, bed mobility, dressing, locomotion level, and caring for others  PARTICIPATION LIMITATIONS: meal prep, cleaning, laundry, interpersonal relationship, shopping, community activity, yard work, and   difficulty with kneeling, getting up from the ground, not falling, caring for her husband, stairs, walking, getting up from sitting, bending, lifting, carrying, gardening, cooking, prolonged standing  PERSONAL FACTORS: Age, Fitness, Past/current experiences, Time since onset of injury/illness/exacerbation, and 3+ comorbidities:   plantar fasciitis, right foot pain (3 surgeries), osteopenia she has S/P total knee arthroplasty and Diverticulitis large intestine on their problem list. She  has a past medical history of Anemia, Anxiety, Arthritis, Cancer (HCC) (1999), GERD (gastroesophageal reflux disease), History of  kidney stones, HOH (hard of hearing), Hypertension, Hypothyroidism, PONV (postoperative nausea and vomiting), and Tinnitus. She  has a past surgical history that includes Hemorroidectomy; Knee arthroscopy (Left); Liposuction; Blepharoplasty; Toe Surgery (Right); Varicose vein surgery; basil cell; Pelvic floor repair; Abdominal hysterectomy; Knee Arthroplasty (Left, 11/14/2016); Knee Arthroplasty (Right, 03/15/2017); total knee replacement (Bilateral, 11/2016); Cystocele repair (2005); Tonsillectomy; Shoulder arthroscopy with debridement and bicep tendon repair (Left, 09/24/2019); Cataract extraction w/PHACO (Left, 08/26/2020); and Cataract extraction w/PHACO (Right, 09/09/2020) are also affecting patient's functional outcome.   REHAB POTENTIAL: Good  CLINICAL DECISION MAKING: Evolving/moderate complexity  EVALUATION COMPLEXITY: Moderate   GOALS: Goals reviewed with patient? No  SHORT TERM GOALS: Target date: 12/14/2022  Patient will be independent with initial home exercise program for self-management of symptoms. Baseline: Initial HEP provided at IE (11/30/22); Goal status: In-progress   LONG TERM GOALS: Target date: 02/22/2023  Patient will be independent with a long-term home exercise program for self-management of symptoms.  Baseline: Initial HEP provided, longterm program still pending  Goal status: In-progress  2.  Patient will demonstrate improved FOTO by equal or greater than 10 points by visit #10 to demonstrate improvement in overall condition and self-reported functional ability.  Baseline: 56 (11/30/22); 01/11/23: 51 (pt denies feeling her balance has gotten worse) Goal status: Not Met   3.  Patient will complete 5 Times Sit To Stand Test in equal or less than 15 seconds from 18.5 inch or less surface with no LOB or use of B UE to improve her ability to get around her home safely and carry items when getting up from a seated position.  Baseline: 23 seconds with frequent LOB backwards  including leaning against plinth and one failed attempt. No use of B UE on the plinth. From 18.5 inch plinth (11/30/22); 01/11/23:17sec from 18.5" chair height hands free Goal status: In-progress  4.  Patient will demonstrate the ability to complete the Floor Transfer Test in equal or less than 8.8 seconds to improve her ability to get up from the ground when gardening or after a fall.   Baseline: to be tested at a later date (11/30/22); unable to complete floor transfer test without two person assistance (12/13/2022); 9/4: did not test, has not been a targeted intervention as of yet   Goal status: In-progress  5.  Patient will complete community, work and/or recreational activities with 50% less limitation due to current condition.  Baseline: difficulty with kneeling, getting up from the ground, not falling, caring for her husband, stairs, walking, getting up from sitting, bending, lifting, carrying, gardening, cooking, prolonged standing (11/30/22);01/11/23: pt reports about the same, no big change here  Goal status: In-progress  6.  Patient will score equal or greater than 25 on the Functional Gait Assessment to demonstrate low fall risk.  Baseline: to be tested at later date as appropriate (11/30/2022); 01/11/23: FGA =19 Goal status: In-progress   PLAN:  PT FREQUENCY: 1-2x/week  PT DURATION: 12 weeks  PLANNED INTERVENTIONS: Therapeutic exercises, Therapeutic activity, Neuromuscular re-education, Balance training, Gait training, Patient/Family education, Self Care, Joint mobilization, Stair training, DME instructions, Dry Needling, Cognitive remediation, Electrical stimulation, Spinal mobilization, Cryotherapy, Moist heat, Manual therapy, and Re-evaluation.  PLAN FOR NEXT SESSION: Update HEP as appropriate, progressive LE/core/functional strengthening and balance exercises. Education. Manual therapy as needed.    Cira Rue, PT,DPT 01/19/2023, 3:28 PM  [facility] [address] [place], [place] [address] P: [phone] I F: [phone]

## 2023-01-23 ENCOUNTER — Ambulatory Visit: Payer: HMO | Admitting: Physical Therapy

## 2023-01-23 ENCOUNTER — Encounter: Payer: Self-pay | Admitting: Physical Therapy

## 2023-01-23 DIAGNOSIS — M6281 Muscle weakness (generalized): Secondary | ICD-10-CM

## 2023-01-23 DIAGNOSIS — G8929 Other chronic pain: Secondary | ICD-10-CM

## 2023-01-23 DIAGNOSIS — M5459 Other low back pain: Secondary | ICD-10-CM | POA: Diagnosis not present

## 2023-01-23 DIAGNOSIS — R2689 Other abnormalities of gait and mobility: Secondary | ICD-10-CM

## 2023-01-23 DIAGNOSIS — Z9181 History of falling: Secondary | ICD-10-CM

## 2023-01-23 NOTE — Therapy (Signed)
OUTPATIENT PHYSICAL THERAPY TREATMENT    Patient Name: Mary Elliott MRN: [id number] DOB:01-Aug-1940, 82 y.o., female Today's Date: 01/23/2023  END OF SESSION:  PT End of Session - 01/23/23 1416     Visit Number 13    Number of Visits 24    Date for PT Re-Evaluation 02/22/23    Authorization Type [employer] reporting period from 01/11/2023    Progress Note Due on Visit 20    PT Start Time 1345    PT Stop Time 1425    PT Time Calculation (min) 40 min    Activity Tolerance Patient tolerated treatment well;No increased pain    Behavior During Therapy WFL for tasks assessed/performed              Past Medical History:  Diagnosis Date   Anemia    Anxiety    Arthritis    fingers   Cancer (HCC) 1999   basil cell on face   GERD (gastroesophageal reflux disease)    history of   History of kidney stones    twice   HOH (hard of hearing)    Hypertension    Hypothyroidism    PONV (postoperative nausea and vomiting)    nausea only with 2 prior surgeries   Tinnitus    Past Surgical History:  Procedure Laterality Date   ABDOMINAL HYSTERECTOMY     basil cell     face   BLEPHAROPLASTY     CATARACT EXTRACTION W/PHACO Left 08/26/2020   Procedure: CATARACT EXTRACTION PHACO AND INTRAOCULAR LENS PLACEMENT (IOC) LEFT PANOPTIX TORIC;  Surgeon: Lockie Mola, MD;  Location: [facility];  Service: Ophthalmology;  Laterality: Left;  8.88 1.:27.2 10.1%   CATARACT EXTRACTION W/PHACO Right 09/09/2020   Procedure: CATARACT EXTRACTION PHACO AND INTRAOCULAR LENS PLACEMENT (IOC) RIGHT PANOPTIX TORIC 8.54 01:03.3 13.5%;  Surgeon: Lockie Mola, MD;  Location: [facility];  Service: Ophthalmology;  Laterality: Right;   CYSTOCELE REPAIR  2005   HEMORROIDECTOMY     JOINT REPLACEMENT Bilateral 11/2016   KNEE ARTHROPLASTY Left 11/14/2016   Procedure: COMPUTER ASSISTED TOTAL KNEE ARTHROPLASTY;  Surgeon: Donato Heinz, MD;  Location: [facility];  Service:  Orthopedics;  Laterality: Left;   KNEE ARTHROPLASTY Right 03/15/2017   Procedure: COMPUTER ASSISTED TOTAL KNEE ARTHROPLASTY;  Surgeon: Donato Heinz, MD;  Location: [facility];  Service: Orthopedics;  Laterality: Right;   KNEE ARTHROSCOPY Left    LIPOSUCTION     PELVIC FLOOR REPAIR     SHOULDER ARTHROSCOPY WITH DEBRIDEMENT AND BICEP TENDON REPAIR Left 09/24/2019   Procedure: Left shoulder arthroscopy with debridement, decompression, rotator cuff repair, and possible biceps tenodesis.;  Surgeon: Christena Flake, MD;  Location: [facility];  Service: Orthopedics;  Laterality: Left;   TOE SURGERY Right    TONSILLECTOMY     VARICOSE VEIN SURGERY     Patient Active Problem List   Diagnosis Date Noted   Diverticulitis large intestine 05/08/2022   S/P total knee arthroplasty 11/14/2016    PCP: Lynnea Ferrier, MD  REFERRING PROVIDER: Dayton Bailiff, PA  REFERRING DIAG: right groin pain, acute bilateral low back pain without sciatica, left hip pain, weakness of both legs  Rationale for Evaluation and Treatment: Rehabilitation  THERAPY DIAG:  Other low back pain  Other abnormalities of gait and mobility  Chronic pain of both knees  History of falling  Muscle weakness (generalized)  ONSET DATE: possibly chronic but current exacerbation started approximately January 2024  PERTINENT HISTORY:  Patient  is a 82 y.o. female who presents to outpatient physical therapy with a referral for medical diagnosis right groin pain, acute bilateral low back pain without sciatica, left hip pain, weakness of both legs. This patient's chief complaints consist of difficulty with mobility, unsteadiness on her feet, falls, low back pain with prolonged standing, and anterior knee pain with kneeling, leading to the following functional deficits: difficulty with kneeling, getting up from the ground, not falling, caring for her husband, stairs, walking, getting up from sitting, bending, lifting, carrying,  gardening, cooking, prolonged standing. Relevant past medical history and comorbidities include plantar fasciitis, right foot pain (3 surgeries), osteopenia she has S/P total knee arthroplasty and Diverticulitis large intestine on their problem list. She  has a past medical history of Anemia, Anxiety, Arthritis, Cancer (HCC) (1999), GERD (gastroesophageal reflux disease), History of kidney stones, HOH (hard of hearing), Hypertension, Hypothyroidism, PONV (postoperative nausea and vomiting), and Tinnitus. She  has a past surgical history that includes Hemorroidectomy; Knee arthroscopy (Left); Liposuction; Blepharoplasty; Toe Surgery (Right); Varicose vein surgery; basil cell; Pelvic floor repair; Abdominal hysterectomy; Knee Arthroplasty (Left, 11/14/2016); Knee Arthroplasty (Right, 03/15/2017); total knee replacement (Bilateral, 11/2016); Cystocele repair (2005); Tonsillectomy; Shoulder arthroscopy with debridement and bicep tendon repair (Left, 09/24/2019); Cataract extraction w/PHACO (Left, 08/26/2020); and Cataract extraction w/PHACO (Right, 09/09/2020). Patient denies hx of stroke, seizures, lung problems, heart problems, diabetes, unexplained weight loss, unexplained changes in bowel or bladder problems, and spinal surgery  SUBJECTIVE:                                                                                                                                                                                           SUBJECTIVE STATEMENT: Patient reports she is feeling well today, denies falls, and does not remember being really sore after last PT session.   PAIN:  NPRS: 0/10  PRECAUTIONS: Fall  PATIENT GOALS: "stay on my feet" "be able to get up out of a chair" improve her core strength  OBJECTIVE  TODAY'S TREATMENT:     Neuromuscular Re-education: to improve, balance, postural strength, muscle activation patterns, and stabilization strength required for functional activities: - lateral stepping  back and forth over small 4-inch aerobic step, CGA-min A to prevent falls. 2x10 each direction. 1x5 each direction as fast as possible.  - lateral stepping on 10 foot airex beam, 1x5 each direction, CGA-minA, LOB frequently backwards.  Therapeutic exercise: to centralize symptoms and improve ROM, strength, muscular endurance, and activity tolerance required for successful completion of functional activities.   - hopping in place with B UE support, 3x10  Superset:  - step up to 12 inch step with  B UE support and minA, 2x10 each side.  - suitcase carry 2x100 feet with 10#KB in each hand.    Pt required multimodal cuing for proper technique and to facilitate improved neuromuscular control, strength, range of motion, and functional ability resulting in improved performance and form.  PATIENT EDUCATION:  Education details: No extensive education this session, mostly focussed on reassessment   HOME EXERCISE PROGRAM: Access Code: [email or web address] URL: [email or web address] Date: 12/13/2022 Prepared by: Norton Blizzard  Exercises - Sit to Stand Without Arm Support  - 3-5 x weekly - 3 sets - 5-10 reps - Standing March with Unilateral Counter Support  - 3-5 x weekly - 3 sets - 10 reps - Standing Knee Flexion with Unilateral Counter Support  - 3-5 x weekly - 3 sets - 10 reps - Standing Hip Abduction with Unilateral Counter Support  - 3-5 x weekly - 3 sets - 10 reps - Heel Toe Raises with Unilateral Counter Support  - 3-5 x weekly - 3 sets - 10 reps  Patient Education - Scar Massage  HOME EXERCISE PROGRAM [[email or web address]] View at [email or web address]" using code: [email or web address] Half Kneel on Padded Chair  -  Repeat 10 Repetitions, Hold 5 Seconds, Complete 2 Sets, Perform 1 Times a Day  ASSESSMENT:  CLINICAL IMPRESSION:  Patient arrives feeling well and tolerated treatment well. She continued to demonstrate LE strength, power, and balance deficits which were challenged appropriately during the  session. She continues to require PT support to complete exercises safely and effectively. Patient would benefit from continued management of limiting condition by skilled physical therapist to address remaining impairments and functional limitations to work towards stated goals and return to PLOF or maximal functional independence.   OBJECTIVE IMPAIRMENTS: Abnormal gait, decreased activity tolerance, decreased balance, decreased endurance, decreased knowledge of condition, decreased knowledge of use of DME, decreased mobility, difficulty walking, decreased ROM, decreased strength, hypomobility, increased edema, impaired perceived functional ability, impaired flexibility, obesity, and pain.   ACTIVITY LIMITATIONS: carrying, lifting, bending, standing, squatting, stairs, transfers, bed mobility, dressing, locomotion level, and caring for others  PARTICIPATION LIMITATIONS: meal prep, cleaning, laundry, interpersonal relationship, shopping, community activity, yard work, and   difficulty with kneeling, getting up from the ground, not falling, caring for her husband, stairs, walking, getting up from sitting, bending, lifting, carrying, gardening, cooking, prolonged standing  PERSONAL FACTORS: Age, Fitness, Past/current experiences, Time since onset of injury/illness/exacerbation, and 3+ comorbidities:   plantar fasciitis, right foot pain (3 surgeries), osteopenia she has S/P total knee arthroplasty and Diverticulitis large intestine on their problem list. She  has a past medical history of Anemia, Anxiety, Arthritis, Cancer (HCC) (1999), GERD (gastroesophageal reflux disease), History of kidney stones, HOH (hard of hearing), Hypertension, Hypothyroidism, PONV (postoperative nausea and vomiting), and Tinnitus. She  has a past surgical history that includes Hemorroidectomy; Knee arthroscopy (Left); Liposuction; Blepharoplasty; Toe Surgery (Right); Varicose vein surgery; basil cell; Pelvic floor repair; Abdominal  hysterectomy; Knee Arthroplasty (Left, 11/14/2016); Knee Arthroplasty (Right, 03/15/2017); total knee replacement (Bilateral, 11/2016); Cystocele repair (2005); Tonsillectomy; Shoulder arthroscopy with debridement and bicep tendon repair (Left, 09/24/2019); Cataract extraction w/PHACO (Left, 08/26/2020); and Cataract extraction w/PHACO (Right, 09/09/2020) are also affecting patient's functional outcome.   REHAB POTENTIAL: Good  CLINICAL DECISION MAKING: Evolving/moderate complexity  EVALUATION COMPLEXITY: Moderate   GOALS: Goals reviewed with patient? No  SHORT TERM GOALS: Target date: 12/14/2022  Patient will be independent with initial home exercise program for self-management of symptoms. Baseline: Initial HEP provided at IE (11/30/22); Goal status: In-progress  LONG TERM GOALS: Target date: 02/22/2023  Patient will be independent with a long-term home exercise program for self-management of symptoms.  Baseline: Initial HEP provided, longterm program still pending  Goal status: In-progress  2.  Patient will demonstrate improved FOTO by equal or greater than 10 points by visit #10 to demonstrate improvement in overall condition and self-reported functional ability.  Baseline: 56 (11/30/22); 01/11/23: 51 (pt denies feeling her balance has gotten worse) Goal status: Not Met   3.  Patient will complete 5 Times Sit To Stand Test in equal or less than 15 seconds from 18.5 inch or less surface with no LOB or use of B UE to improve her ability to get around her home safely and carry items when getting up from a seated position.  Baseline: 23 seconds with frequent LOB backwards including leaning against plinth and one failed attempt. No use of B UE on the plinth. From 18.5 inch plinth (11/30/22); 01/11/23:17sec from 18.5" chair height hands free Goal status: In-progress  4.  Patient will demonstrate the ability to complete the Floor Transfer Test in equal or less than 8.8 seconds to improve her  ability to get up from the ground when gardening or after a fall.   Baseline: to be tested at a later date (11/30/22); unable to complete floor transfer test without two person assistance (12/13/2022); 9/4: did not test, has not been a targeted intervention as of yet   Goal status: In-progress  5.  Patient will complete community, work and/or recreational activities with 50% less limitation due to current condition.  Baseline: difficulty with kneeling, getting up from the ground, not falling, caring for her husband, stairs, walking, getting up from sitting, bending, lifting, carrying, gardening, cooking, prolonged standing (11/30/22);01/11/23: pt reports about the same, no big change here  Goal status: In-progress  6.  Patient will score equal or greater than 25 on the Functional Gait Assessment to demonstrate low fall risk.  Baseline: to be tested at later date as appropriate (11/30/2022); 01/11/23: FGA =19 Goal status: In-progress   PLAN:  PT FREQUENCY: 1-2x/week  PT DURATION: 12 weeks  PLANNED INTERVENTIONS: Therapeutic exercises, Therapeutic activity, Neuromuscular re-education, Balance training, Gait training, Patient/Family education, Self Care, Joint mobilization, Stair training, DME instructions, Dry Needling, Cognitive remediation, Electrical stimulation, Spinal mobilization, Cryotherapy, Moist heat, Manual therapy, and Re-evaluation.  PLAN FOR NEXT SESSION: Update HEP as appropriate, progressive LE/core/functional strengthening and balance exercises. Education. Manual therapy as needed.    Cira Rue, PT,DPT 01/23/2023, 2:29 PM  [facility] [address] [place], [place] [address] P: [phone] I F: [phone]

## 2023-01-25 ENCOUNTER — Encounter: Payer: HMO | Admitting: Physical Therapy

## 2023-01-25 ENCOUNTER — Encounter: Payer: Self-pay | Admitting: Physical Therapy

## 2023-01-25 ENCOUNTER — Ambulatory Visit: Payer: HMO | Admitting: Physical Therapy

## 2023-01-25 DIAGNOSIS — Z9181 History of falling: Secondary | ICD-10-CM

## 2023-01-25 DIAGNOSIS — M5459 Other low back pain: Secondary | ICD-10-CM | POA: Diagnosis not present

## 2023-01-25 DIAGNOSIS — R2689 Other abnormalities of gait and mobility: Secondary | ICD-10-CM

## 2023-01-25 DIAGNOSIS — M6281 Muscle weakness (generalized): Secondary | ICD-10-CM

## 2023-01-25 DIAGNOSIS — G8929 Other chronic pain: Secondary | ICD-10-CM

## 2023-01-25 NOTE — Therapy (Signed)
OUTPATIENT PHYSICAL THERAPY TREATMENT    Patient Name: Mary Elliott MRN: [id number] DOB:[birth year], 82 y.o., female Today's Date: 01/25/2023  END OF SESSION:  PT End of Session - 01/25/23 1029     Visit Number 14    Number of Visits 24    Date for PT Re-Evaluation 02/22/23    Authorization Type [employer] reporting period from 01/11/2023    Progress Note Due on Visit 20    PT Start Time 1035    PT Stop Time 1115    PT Time Calculation (min) 40 min    Activity Tolerance Patient tolerated treatment well;No increased pain    Behavior During Therapy WFL for tasks assessed/performed               Past Medical History:  Diagnosis Date   Anemia    Anxiety    Arthritis    fingers   Cancer (HCC) 1999   basil cell on face   GERD (gastroesophageal reflux disease)    history of   History of kidney stones    twice   HOH (hard of hearing)    Hypertension    Hypothyroidism    PONV (postoperative nausea and vomiting)    nausea only with 2 prior surgeries   Tinnitus    Past Surgical History:  Procedure Laterality Date   ABDOMINAL HYSTERECTOMY     basil cell     face   BLEPHAROPLASTY     CATARACT EXTRACTION W/PHACO Left 08/26/2020   Procedure: CATARACT EXTRACTION PHACO AND INTRAOCULAR LENS PLACEMENT (IOC) LEFT PANOPTIX TORIC;  Surgeon: Lockie Mola, MD;  Location: [facility];  Service: Ophthalmology;  Laterality: Left;  8.88 1.:27.2 10.1%   CATARACT EXTRACTION W/PHACO Right 09/09/2020   Procedure: CATARACT EXTRACTION PHACO AND INTRAOCULAR LENS PLACEMENT (IOC) RIGHT PANOPTIX TORIC 8.54 01:03.3 13.5%;  Surgeon: Lockie Mola, MD;  Location: [facility];  Service: Ophthalmology;  Laterality: Right;   CYSTOCELE REPAIR  2005   HEMORROIDECTOMY     JOINT REPLACEMENT Bilateral 11/2016   KNEE ARTHROPLASTY Left 11/14/2016   Procedure: COMPUTER ASSISTED TOTAL KNEE ARTHROPLASTY;  Surgeon: Donato Heinz, MD;  Location: [facility];  Service:  Orthopedics;  Laterality: Left;   KNEE ARTHROPLASTY Right 03/15/2017   Procedure: COMPUTER ASSISTED TOTAL KNEE ARTHROPLASTY;  Surgeon: Donato Heinz, MD;  Location: [facility];  Service: Orthopedics;  Laterality: Right;   KNEE ARTHROSCOPY Left    LIPOSUCTION     PELVIC FLOOR REPAIR     SHOULDER ARTHROSCOPY WITH DEBRIDEMENT AND BICEP TENDON REPAIR Left 09/24/2019   Procedure: Left shoulder arthroscopy with debridement, decompression, rotator cuff repair, and possible biceps tenodesis.;  Surgeon: Christena Flake, MD;  Location: [facility];  Service: Orthopedics;  Laterality: Left;   TOE SURGERY Right    TONSILLECTOMY     VARICOSE VEIN SURGERY     Patient Active Problem List   Diagnosis Date Noted   Diverticulitis large intestine 05/08/2022   S/P total knee arthroplasty 11/14/2016    PCP: Lynnea Ferrier, MD  REFERRING PROVIDER: Dayton Bailiff, PA  REFERRING DIAG: right groin pain, acute bilateral low back pain without sciatica, left hip pain, weakness of both legs  Rationale for Evaluation and Treatment: Rehabilitation  THERAPY DIAG:  Other low back pain  Other abnormalities of gait and mobility  Chronic pain of both knees  History of falling  Muscle weakness (generalized)  ONSET DATE: possibly chronic but current exacerbation started approximately January 2024  PERTINENT HISTORY:  Patient is a 82 y.o. female who presents to outpatient physical therapy with a referral for medical diagnosis right groin pain, acute bilateral low back pain without sciatica, left hip pain, weakness of both legs. This patient's chief complaints consist of difficulty with mobility, unsteadiness on her feet, falls, low back pain with prolonged standing, and anterior knee pain with kneeling, leading to the following functional deficits: difficulty with kneeling, getting up from the ground, not falling, caring for her husband, stairs, walking, getting up from sitting, bending, lifting, carrying,  gardening, cooking, prolonged standing. Relevant past medical history and comorbidities include plantar fasciitis, right foot pain (3 surgeries), osteopenia she has S/P total knee arthroplasty and Diverticulitis large intestine on their problem list. She  has a past medical history of Anemia, Anxiety, Arthritis, Cancer (HCC) (1999), GERD (gastroesophageal reflux disease), History of kidney stones, HOH (hard of hearing), Hypertension, Hypothyroidism, PONV (postoperative nausea and vomiting), and Tinnitus. She  has a past surgical history that includes Hemorroidectomy; Knee arthroscopy (Left); Liposuction; Blepharoplasty; Toe Surgery (Right); Varicose vein surgery; basil cell; Pelvic floor repair; Abdominal hysterectomy; Knee Arthroplasty (Left, 11/14/2016); Knee Arthroplasty (Right, 03/15/2017); total knee replacement (Bilateral, 11/2016); Cystocele repair (2005); Tonsillectomy; Shoulder arthroscopy with debridement and bicep tendon repair (Left, 09/24/2019); Cataract extraction w/PHACO (Left, 08/26/2020); and Cataract extraction w/PHACO (Right, 09/09/2020). Patient denies hx of stroke, seizures, lung problems, heart problems, diabetes, unexplained weight loss, unexplained changes in bowel or bladder problems, and spinal surgery  SUBJECTIVE:                                                                                                                                                                                           SUBJECTIVE STATEMENT: Patient reports she is feeling well today. She had no pain after last PT session. She states she tried to step over a step stool and back at home in a narrow place (so she could hold on if needed) and she fell. She got herself back up after she called for her husband and he didn't hear her. She thinks she did not pick her foot up high enough. She notes she was  PAIN:  NPRS: 0/10  PRECAUTIONS: Fall  PATIENT GOALS: "stay on my feet" "be able to get up out of a chair"  improve her core strength  OBJECTIVE  TODAY'S TREATMENT:     Neuromuscular Re-education: to improve, balance, postural strength, muscle activation patterns, and stabilization strength required for functional activities: - review of safe HEP - cone tip/right 1x10 each side SBA-minA for one LOB. Several failed attempts.  - double leg hops on mini-trampoline with BUE support at TM bar, ~10  bouts of 5-10 hops.  - ball toss at rebounder:  - self-selected stance on airex pad: 1x20 with small pink theraball, 1x20 with 2kg med ball. CGA-minA.  - Step-standing with one foot on flat side of large half spike ball, 1x20 each side with small pink theraball. SBA-minA and ball retrieval assist.   Therapeutic exercise: to centralize symptoms and improve ROM, strength, muscular endurance, and activity tolerance required for successful completion of functional activities.  - sled push 4 sets of 2x30 feet, using tall poles, unloaded 25# sled, attempting to move as fast as possible. Seated rest between 2nd and 3rd set. Standing rest between the others. minA with sled turn from PT.     Pt required multimodal cuing for proper technique and to facilitate improved neuromuscular control, strength, range of motion, and functional ability resulting in improved performance and form.  PATIENT EDUCATION:  Education details: No extensive education this session, mostly focussed on reassessment   HOME EXERCISE PROGRAM: Access Code: [email or web address] URL: [email or web address] Date: 12/13/2022 Prepared by: Norton Blizzard  Exercises - Sit to Stand Without Arm Support  - 3-5 x weekly - 3 sets - 5-10 reps - Standing March with Unilateral Counter Support  - 3-5 x weekly - 3 sets - 10 reps - Standing Knee Flexion with Unilateral Counter Support  - 3-5 x weekly - 3 sets - 10 reps - Standing Hip Abduction with Unilateral Counter Support  - 3-5 x weekly - 3 sets - 10 reps - Heel Toe Raises with Unilateral Counter Support   - 3-5 x weekly - 3 sets - 10 reps  Patient Education - Scar Massage  HOME EXERCISE PROGRAM [[email or web address]] View at "[email or web address]" using code: [email or web address] Half Kneel on Padded Chair  -  Repeat 10 Repetitions, Hold 5 Seconds, Complete 2 Sets, Perform 1 Times a Day  ASSESSMENT:  CLINICAL IMPRESSION:  Patient arrives reporting a fall after trying to practice stepping over a stool at home. Today's session included review of safety precautions for HEP and continued working on LE strength/power/function and balance exercises. Patient would benefit from continued management of limiting condition by skilled physical therapist to address remaining impairments and functional limitations to work towards stated goals and return to PLOF or maximal functional independence.    OBJECTIVE IMPAIRMENTS: Abnormal gait, decreased activity tolerance, decreased balance, decreased endurance, decreased knowledge of condition, decreased knowledge of use of DME, decreased mobility, difficulty walking, decreased ROM, decreased strength, hypomobility, increased edema, impaired perceived functional ability, impaired flexibility, obesity, and pain.   ACTIVITY LIMITATIONS: carrying, lifting, bending, standing, squatting, stairs, transfers, bed mobility, dressing, locomotion level, and caring for others  PARTICIPATION LIMITATIONS: meal prep, cleaning, laundry, interpersonal relationship, shopping, community activity, yard work, and   difficulty with kneeling, getting up from the ground, not falling, caring for her husband, stairs, walking, getting up from sitting, bending, lifting, carrying, gardening, cooking, prolonged standing  PERSONAL FACTORS: Age, Fitness, Past/current experiences, Time since onset of injury/illness/exacerbation, and 3+ comorbidities:   plantar fasciitis, right foot pain (3 surgeries), osteopenia she has S/P total knee arthroplasty and Diverticulitis large intestine on their problem list. She  has a past  medical history of Anemia, Anxiety, Arthritis, Cancer (HCC) (1999), GERD (gastroesophageal reflux disease), History of kidney stones, HOH (hard of hearing), Hypertension, Hypothyroidism, PONV (postoperative nausea and vomiting), and Tinnitus. She  has a past surgical history that includes Hemorroidectomy; Knee arthroscopy (Left); Liposuction; Blepharoplasty; Toe Surgery (Right); Varicose vein surgery; basil cell; Pelvic floor repair; Abdominal hysterectomy; Knee Arthroplasty (Left, 11/14/2016);  Knee Arthroplasty (Right, 03/15/2017); total knee replacement (Bilateral, 11/2016); Cystocele repair (2005); Tonsillectomy; Shoulder arthroscopy with debridement and bicep tendon repair (Left, 09/24/2019); Cataract extraction w/PHACO (Left, 08/26/2020); and Cataract extraction w/PHACO (Right, 09/09/2020) are also affecting patient's functional outcome.   REHAB POTENTIAL: Good  CLINICAL DECISION MAKING: Evolving/moderate complexity  EVALUATION COMPLEXITY: Moderate   GOALS: Goals reviewed with patient? No  SHORT TERM GOALS: Target date: 12/14/2022  Patient will be independent with initial home exercise program for self-management of symptoms. Baseline: Initial HEP provided at IE (11/30/22); Goal status: In-progress   LONG TERM GOALS: Target date: 02/22/2023  Patient will be independent with a long-term home exercise program for self-management of symptoms.  Baseline: Initial HEP provided, longterm program still pending  Goal status: In-progress  2.  Patient will demonstrate improved FOTO by equal or greater than 10 points by visit #10 to demonstrate improvement in overall condition and self-reported functional ability.  Baseline: 56 (11/30/22); 01/11/23: 51 (pt denies feeling her balance has gotten worse) Goal status: Not Met   3.  Patient will complete 5 Times Sit To Stand Test in equal or less than 15 seconds from 18.5 inch or less surface with no LOB or use of B UE to improve her ability to get around her  home safely and carry items when getting up from a seated position.  Baseline: 23 seconds with frequent LOB backwards including leaning against plinth and one failed attempt. No use of B UE on the plinth. From 18.5 inch plinth (11/30/22); 01/11/23:17sec from 18.5" chair height hands free Goal status: In-progress  4.  Patient will demonstrate the ability to complete the Floor Transfer Test in equal or less than 8.8 seconds to improve her ability to get up from the ground when gardening or after a fall.   Baseline: to be tested at a later date (11/30/22); unable to complete floor transfer test without two person assistance (12/13/2022); 9/4: did not test, has not been a targeted intervention as of yet   Goal status: In-progress  5.  Patient will complete community, work and/or recreational activities with 50% less limitation due to current condition.  Baseline: difficulty with kneeling, getting up from the ground, not falling, caring for her husband, stairs, walking, getting up from sitting, bending, lifting, carrying, gardening, cooking, prolonged standing (11/30/22);01/11/23: pt reports about the same, no big change here  Goal status: In-progress  6.  Patient will score equal or greater than 25 on the Functional Gait Assessment to demonstrate low fall risk.  Baseline: to be tested at later date as appropriate (11/30/2022); 01/11/23: FGA =19 Goal status: In-progress   PLAN:  PT FREQUENCY: 1-2x/week  PT DURATION: 12 weeks  PLANNED INTERVENTIONS: Therapeutic exercises, Therapeutic activity, Neuromuscular re-education, Balance training, Gait training, Patient/Family education, Self Care, Joint mobilization, Stair training, DME instructions, Dry Needling, Cognitive remediation, Electrical stimulation, Spinal mobilization, Cryotherapy, Moist heat, Manual therapy, and Re-evaluation.  PLAN FOR NEXT SESSION: Update HEP as appropriate, progressive LE/core/functional strengthening and balance exercises.  Education. Manual therapy as needed.    Cira Rue, PT,DPT 01/25/2023, 11:44 AM  [facility] [address] [place], [place] [address] P: [phone] I F: [phone]

## 2023-01-26 DIAGNOSIS — F410 Panic disorder [episodic paroxysmal anxiety] without agoraphobia: Secondary | ICD-10-CM | POA: Diagnosis not present

## 2023-01-26 DIAGNOSIS — F411 Generalized anxiety disorder: Secondary | ICD-10-CM | POA: Diagnosis not present

## 2023-01-26 DIAGNOSIS — F3342 Major depressive disorder, recurrent, in full remission: Secondary | ICD-10-CM | POA: Diagnosis not present

## 2023-01-27 DIAGNOSIS — G629 Polyneuropathy, unspecified: Secondary | ICD-10-CM | POA: Diagnosis not present

## 2023-01-27 DIAGNOSIS — E034 Atrophy of thyroid (acquired): Secondary | ICD-10-CM | POA: Diagnosis not present

## 2023-01-27 DIAGNOSIS — K219 Gastro-esophageal reflux disease without esophagitis: Secondary | ICD-10-CM | POA: Diagnosis not present

## 2023-01-27 DIAGNOSIS — I10 Essential (primary) hypertension: Secondary | ICD-10-CM | POA: Diagnosis not present

## 2023-01-27 DIAGNOSIS — E7800 Pure hypercholesterolemia, unspecified: Secondary | ICD-10-CM | POA: Diagnosis not present

## 2023-01-30 ENCOUNTER — Encounter: Payer: Self-pay | Admitting: Physical Therapy

## 2023-01-30 ENCOUNTER — Ambulatory Visit: Payer: HMO | Admitting: Physical Therapy

## 2023-01-30 DIAGNOSIS — M5459 Other low back pain: Secondary | ICD-10-CM

## 2023-01-30 DIAGNOSIS — G8929 Other chronic pain: Secondary | ICD-10-CM

## 2023-01-30 DIAGNOSIS — Z9181 History of falling: Secondary | ICD-10-CM

## 2023-01-30 DIAGNOSIS — M6281 Muscle weakness (generalized): Secondary | ICD-10-CM

## 2023-01-30 DIAGNOSIS — R2689 Other abnormalities of gait and mobility: Secondary | ICD-10-CM

## 2023-01-30 NOTE — Therapy (Signed)
OUTPATIENT PHYSICAL THERAPY TREATMENT    Patient Name: Mary Elliott MRN: [id number] DOB:10-18-1940, 82 y.o., female Today's Date: 01/30/2023  END OF SESSION:  PT End of Session - 01/30/23 1434     Visit Number 15    Number of Visits 24    Date for PT Re-Evaluation 02/22/23    Authorization Type [employer] reporting period from 01/11/2023    Progress Note Due on Visit 20    PT Start Time 1434    PT Stop Time 1525    PT Time Calculation (min) 51 min    Activity Tolerance Patient tolerated treatment well;No increased pain    Behavior During Therapy WFL for tasks assessed/performed                Past Medical History:  Diagnosis Date   Anemia    Anxiety    Arthritis    fingers   Cancer (HCC) 1999   basil cell on face   GERD (gastroesophageal reflux disease)    history of   History of kidney stones    twice   HOH (hard of hearing)    Hypertension    Hypothyroidism    PONV (postoperative nausea and vomiting)    nausea only with 2 prior surgeries   Tinnitus    Past Surgical History:  Procedure Laterality Date   ABDOMINAL HYSTERECTOMY     basil cell     face   BLEPHAROPLASTY     CATARACT EXTRACTION W/PHACO Left 08/26/2020   Procedure: CATARACT EXTRACTION PHACO AND INTRAOCULAR LENS PLACEMENT (IOC) LEFT PANOPTIX TORIC;  Surgeon: Lockie Mola, MD;  Location: [facility];  Service: Ophthalmology;  Laterality: Left;  8.88 1.:27.2 10.1%   CATARACT EXTRACTION W/PHACO Right 09/09/2020   Procedure: CATARACT EXTRACTION PHACO AND INTRAOCULAR LENS PLACEMENT (IOC) RIGHT PANOPTIX TORIC 8.54 01:03.3 13.5%;  Surgeon: Lockie Mola, MD;  Location: [facility];  Service: Ophthalmology;  Laterality: Right;   CYSTOCELE REPAIR  2005   HEMORROIDECTOMY     JOINT REPLACEMENT Bilateral 11/2016   KNEE ARTHROPLASTY Left 11/14/2016   Procedure: COMPUTER ASSISTED TOTAL KNEE ARTHROPLASTY;  Surgeon: Donato Heinz, MD;  Location: [facility];  Service:  Orthopedics;  Laterality: Left;   KNEE ARTHROPLASTY Right 03/15/2017   Procedure: COMPUTER ASSISTED TOTAL KNEE ARTHROPLASTY;  Surgeon: Donato Heinz, MD;  Location: [facility];  Service: Orthopedics;  Laterality: Right;   KNEE ARTHROSCOPY Left    LIPOSUCTION     PELVIC FLOOR REPAIR     SHOULDER ARTHROSCOPY WITH DEBRIDEMENT AND BICEP TENDON REPAIR Left 09/24/2019   Procedure: Left shoulder arthroscopy with debridement, decompression, rotator cuff repair, and possible biceps tenodesis.;  Surgeon: Christena Flake, MD;  Location: [facility];  Service: Orthopedics;  Laterality: Left;   TOE SURGERY Right    TONSILLECTOMY     VARICOSE VEIN SURGERY     Patient Active Problem List   Diagnosis Date Noted   Diverticulitis large intestine 05/08/2022   S/P total knee arthroplasty 11/14/2016    PCP: Lynnea Ferrier, MD  REFERRING PROVIDER: Dayton Bailiff, PA  REFERRING DIAG: right groin pain, acute bilateral low back pain without sciatica, left hip pain, weakness of both legs  Rationale for Evaluation and Treatment: Rehabilitation  THERAPY DIAG:  Other low back pain  Other abnormalities of gait and mobility  Chronic pain of both knees  History of falling  Muscle weakness (generalized)  ONSET DATE: possibly chronic but current exacerbation started approximately January 2024  PERTINENT HISTORY:  Patient is a 82 y.o. female who presents to outpatient physical therapy with a referral for medical diagnosis right groin pain, acute bilateral low back pain without sciatica, left hip pain, weakness of both legs. This patient's chief complaints consist of difficulty with mobility, unsteadiness on her feet, falls, low back pain with prolonged standing, and anterior knee pain with kneeling, leading to the following functional deficits: difficulty with kneeling, getting up from the ground, not falling, caring for her husband, stairs, walking, getting up from sitting, bending, lifting, carrying,  gardening, cooking, prolonged standing. Relevant past medical history and comorbidities include plantar fasciitis, right foot pain (3 surgeries), osteopenia she has S/P total knee arthroplasty and Diverticulitis large intestine on their problem list. She  has a past medical history of Anemia, Anxiety, Arthritis, Cancer (HCC) (1999), GERD (gastroesophageal reflux disease), History of kidney stones, HOH (hard of hearing), Hypertension, Hypothyroidism, PONV (postoperative nausea and vomiting), and Tinnitus. She  has a past surgical history that includes Hemorroidectomy; Knee arthroscopy (Left); Liposuction; Blepharoplasty; Toe Surgery (Right); Varicose vein surgery; basil cell; Pelvic floor repair; Abdominal hysterectomy; Knee Arthroplasty (Left, 11/14/2016); Knee Arthroplasty (Right, 03/15/2017); total knee replacement (Bilateral, 11/2016); Cystocele repair (2005); Tonsillectomy; Shoulder arthroscopy with debridement and bicep tendon repair (Left, 09/24/2019); Cataract extraction w/PHACO (Left, 08/26/2020); and Cataract extraction w/PHACO (Right, 09/09/2020). Patient denies hx of stroke, seizures, lung problems, heart problems, diabetes, unexplained weight loss, unexplained changes in bowel or bladder problems, and spinal surgery  SUBJECTIVE:                                                                                                                                                                                           SUBJECTIVE STATEMENT: Patient reports she is feeling okay today. She states she had to rest a long time after last PT session. Patient states her HEP is going okay. She has kicked the cone a lot and had no more falls. She has been awake since 7:30 this morning. She states last Wednesday she had to call EMS for her husband. It turned out his blood glucose was low. She states she has been exercising on the tricycle and kicking the cone since last PT session but did not do her sit <> stand exercises.    PAIN:  NPRS: 0/10  PRECAUTIONS: Fall  PATIENT GOALS: "stay on my feet" "be able to get up out of a chair" improve her core strength  OBJECTIVE  TODAY'S TREATMENT:     Neuromuscular Re-education: to improve, balance, postural strength, muscle activation patterns, and stabilization strength required for functional activities: - cone tip/right 1x10 each side SBA-minA for one LOB. Several failed  attempts.  - double leg hops on mini-trampoline with BUE support at TM bar, ~10-15 bouts of up to 15 hops in a row.   - ball toss at rebounder:  - self-selected stance on airex pad: 1x20 with small pink theraball, 1x20 with 2kg med ball. CGA-minA.  - Step-standing with one foot on flat side of large half spike ball, 3x20 each side with small pink theraball. SBA and ball retrieval assist.   Therapeutic activities: dynamic exercise for functional strengthening and improved functional activity tolerance. - sled push 4 sets of 2x30 feet, using tall poles, unloaded 75# sled, attempting to move as fast as possible. Seated rest between 2nd and 3rd set. Standing rest between the others. minA with sled turn from PT.  - sit <> stand from 17 inch chair, 1x5  - Squats with BUE support at sink, touching buttocks to 17 inch chair, discussing how to perform at home, 3x10.    Pt required multimodal cuing for proper technique and to facilitate improved neuromuscular control, strength, range of motion, and functional ability resulting in improved performance and form.  PATIENT EDUCATION:  Education details: Exercise purpose/form. Self management techniques. Education on diagnosis, prognosis, POC, anatomy and physiology of current condition. Education on HEP including handout. Person educated: Patient Education method: Explanation, Demonstration, Tactile cues, Verbal cues, and Handouts Education comprehension: verbalized understanding, returned demonstration, and needs further education  HOME EXERCISE  PROGRAM: Access Code: [email or web address] URL: [email or web address] Date: 01/30/2023 Prepared by: Norton Blizzard  Exercises - Squat with Chair and Counter Support  - 3-5 x weekly - 3 sets - 10 reps - Standing March with Unilateral Counter Support  - 3-5 x weekly - 3 sets - 10 reps - Standing Knee Flexion with Unilateral Counter Support  - 3-5 x weekly - 3 sets - 10 reps - Standing Hip Abduction with Unilateral Counter Support  - 3-5 x weekly - 3 sets - 10 reps - Heel Toe Raises with Unilateral Counter Support  - 3-5 x weekly - 3 sets - 10 reps  HOME EXERCISE PROGRAM [[email or web address]] View at "[email or web address]" using code: [email or web address] Half Kneel on Padded Chair  -  Repeat 10 Repetitions, Hold 5 Seconds, Complete 2 Sets, Perform 1 Times a Day  HOME EXERCISE PROGRAM [[email or web address]] View at "[email or web address]" using code: [email or web address] DYNAMIC SINGLE LIMB STANCE - CONE KNOCK DOWN AND SET UP -  Repeat 10 Repetitions, Hold 1 Second(s), Complete 1 Set, Perform 1 Times a Day  ASSESSMENT:  CLINICAL IMPRESSION:  Patient arrives with good tolerance to last PT session. Session continued with focus on functional strengthening and balance exercises. Patient demonstrates improving balance with less loss of balance. She continues to have difficulty with coordination and power of B LE needed for improving ability to catch falls when becoming unsteady. She also has LE weakness that limits her fall recovery and transfer ability. Patient would benefit from continued management of limiting condition by skilled physical therapist to address remaining impairments and functional limitations to work towards stated goals and return to PLOF or maximal functional independence.     OBJECTIVE IMPAIRMENTS: Abnormal gait, decreased activity tolerance, decreased balance, decreased endurance, decreased knowledge of condition, decreased knowledge of use of DME, decreased mobility, difficulty walking, decreased ROM, decreased strength,  hypomobility, increased edema, impaired perceived functional ability, impaired flexibility, obesity, and pain.   ACTIVITY LIMITATIONS: carrying, lifting, bending, standing, squatting, stairs, transfers, bed mobility, dressing, locomotion level, and caring for others  PARTICIPATION LIMITATIONS: meal prep, cleaning, laundry, interpersonal relationship, shopping, community activity,  yard work, and   difficulty with kneeling, getting up from the ground, not falling, caring for her husband, stairs, walking, getting up from sitting, bending, lifting, carrying, gardening, cooking, prolonged standing  PERSONAL FACTORS: Age, Fitness, Past/current experiences, Time since onset of injury/illness/exacerbation, and 3+ comorbidities:   plantar fasciitis, right foot pain (3 surgeries), osteopenia she has S/P total knee arthroplasty and Diverticulitis large intestine on their problem list. She  has a past medical history of Anemia, Anxiety, Arthritis, Cancer (HCC) (1999), GERD (gastroesophageal reflux disease), History of kidney stones, HOH (hard of hearing), Hypertension, Hypothyroidism, PONV (postoperative nausea and vomiting), and Tinnitus. She  has a past surgical history that includes Hemorroidectomy; Knee arthroscopy (Left); Liposuction; Blepharoplasty; Toe Surgery (Right); Varicose vein surgery; basil cell; Pelvic floor repair; Abdominal hysterectomy; Knee Arthroplasty (Left, 11/14/2016); Knee Arthroplasty (Right, 03/15/2017); total knee replacement (Bilateral, 11/2016); Cystocele repair (2005); Tonsillectomy; Shoulder arthroscopy with debridement and bicep tendon repair (Left, 09/24/2019); Cataract extraction w/PHACO (Left, 08/26/2020); and Cataract extraction w/PHACO (Right, 09/09/2020) are also affecting patient's functional outcome.   REHAB POTENTIAL: Good  CLINICAL DECISION MAKING: Evolving/moderate complexity  EVALUATION COMPLEXITY: Moderate   GOALS: Goals reviewed with patient? No  SHORT TERM GOALS: Target  date: 12/14/2022  Patient will be independent with initial home exercise program for self-management of symptoms. Baseline: Initial HEP provided at IE (11/30/22); Goal status: In-progress   LONG TERM GOALS: Target date: 02/22/2023  Patient will be independent with a long-term home exercise program for self-management of symptoms.  Baseline: Initial HEP provided, longterm program still pending  Goal status: In-progress  2.  Patient will demonstrate improved FOTO by equal or greater than 10 points by visit #10 to demonstrate improvement in overall condition and self-reported functional ability.  Baseline: 56 (11/30/22); 01/11/23: 51 (pt denies feeling her balance has gotten worse) Goal status: Not Met   3.  Patient will complete 5 Times Sit To Stand Test in equal or less than 15 seconds from 18.5 inch or less surface with no LOB or use of B UE to improve her ability to get around her home safely and carry items when getting up from a seated position.  Baseline: 23 seconds with frequent LOB backwards including leaning against plinth and one failed attempt. No use of B UE on the plinth. From 18.5 inch plinth (11/30/22); 01/11/23:17sec from 18.5" chair height hands free Goal status: In-progress  4.  Patient will demonstrate the ability to complete the Floor Transfer Test in equal or less than 8.8 seconds to improve her ability to get up from the ground when gardening or after a fall.   Baseline: to be tested at a later date (11/30/22); unable to complete floor transfer test without two person assistance (12/13/2022); 9/4: did not test, has not been a targeted intervention as of yet   Goal status: In-progress  5.  Patient will complete community, work and/or recreational activities with 50% less limitation due to current condition.  Baseline: difficulty with kneeling, getting up from the ground, not falling, caring for her husband, stairs, walking, getting up from sitting, bending, lifting, carrying,  gardening, cooking, prolonged standing (11/30/22);01/11/23: pt reports about the same, no big change here  Goal status: In-progress  6.  Patient will score equal or greater than 25 on the Functional Gait Assessment to demonstrate low fall risk.  Baseline: to be tested at later date as appropriate (11/30/2022); 01/11/23: FGA =19 Goal status: In-progress   PLAN:  PT FREQUENCY: 1-2x/week  PT DURATION: 12 weeks  PLANNED  INTERVENTIONS: Therapeutic exercises, Therapeutic activity, Neuromuscular re-education, Balance training, Gait training, Patient/Family education, Self Care, Joint mobilization, Stair training, DME instructions, Dry Needling, Cognitive remediation, Electrical stimulation, Spinal mobilization, Cryotherapy, Moist heat, Manual therapy, and Re-evaluation.  PLAN FOR NEXT SESSION: Update HEP as appropriate, progressive LE/core/functional strengthening and balance exercises. Education. Manual therapy as needed.    Cira Rue, PT,DPT 01/30/2023, 3:44 PM  [facility] [address] [place], [place] [address] P: [phone] I F: [phone]

## 2023-02-01 ENCOUNTER — Encounter: Payer: Self-pay | Admitting: Physical Therapy

## 2023-02-01 ENCOUNTER — Ambulatory Visit: Payer: HMO | Admitting: Physical Therapy

## 2023-02-01 ENCOUNTER — Other Ambulatory Visit: Payer: Self-pay | Admitting: Internal Medicine

## 2023-02-01 ENCOUNTER — Encounter: Payer: HMO | Admitting: Physical Therapy

## 2023-02-01 DIAGNOSIS — Z1231 Encounter for screening mammogram for malignant neoplasm of breast: Secondary | ICD-10-CM

## 2023-02-01 DIAGNOSIS — M5459 Other low back pain: Secondary | ICD-10-CM | POA: Diagnosis not present

## 2023-02-01 DIAGNOSIS — M19042 Primary osteoarthritis, left hand: Secondary | ICD-10-CM | POA: Diagnosis not present

## 2023-02-01 DIAGNOSIS — G8929 Other chronic pain: Secondary | ICD-10-CM

## 2023-02-01 DIAGNOSIS — R2689 Other abnormalities of gait and mobility: Secondary | ICD-10-CM

## 2023-02-01 DIAGNOSIS — M25441 Effusion, right hand: Secondary | ICD-10-CM | POA: Diagnosis not present

## 2023-02-01 NOTE — Therapy (Signed)
OUTPATIENT PHYSICAL THERAPY TREATMENT    Patient Name: Mary Elliott MRN: [id number] DOB:1941/04/19, 82 y.o., female Today's Date: 02/01/2023  END OF SESSION:  PT End of Session - 02/01/23 1158     Visit Number 16    Number of Visits 24    Date for PT Re-Evaluation 02/22/23    Authorization Type [employer] reporting period from 01/11/2023    Progress Note Due on Visit 20    PT Start Time 1035    PT Stop Time 1113    PT Time Calculation (min) 38 min    Activity Tolerance Patient tolerated treatment well;No increased pain    Behavior During Therapy WFL for tasks assessed/performed                 Past Medical History:  Diagnosis Date   Anemia    Anxiety    Arthritis    fingers   Cancer (HCC) 1999   basil cell on face   GERD (gastroesophageal reflux disease)    history of   History of kidney stones    twice   HOH (hard of hearing)    Hypertension    Hypothyroidism    PONV (postoperative nausea and vomiting)    nausea only with 2 prior surgeries   Tinnitus    Past Surgical History:  Procedure Laterality Date   ABDOMINAL HYSTERECTOMY     basil cell     face   BLEPHAROPLASTY     CATARACT EXTRACTION W/PHACO Left 08/26/2020   Procedure: CATARACT EXTRACTION PHACO AND INTRAOCULAR LENS PLACEMENT (IOC) LEFT PANOPTIX TORIC;  Surgeon: Lockie Mola, MD;  Location: [facility];  Service: Ophthalmology;  Laterality: Left;  8.88 1.:27.2 10.1%   CATARACT EXTRACTION W/PHACO Right 09/09/2020   Procedure: CATARACT EXTRACTION PHACO AND INTRAOCULAR LENS PLACEMENT (IOC) RIGHT PANOPTIX TORIC 8.54 01:03.3 13.5%;  Surgeon: Lockie Mola, MD;  Location: [facility];  Service: Ophthalmology;  Laterality: Right;   CYSTOCELE REPAIR  2005   HEMORROIDECTOMY     JOINT REPLACEMENT Bilateral 11/2016   KNEE ARTHROPLASTY Left 11/14/2016   Procedure: COMPUTER ASSISTED TOTAL KNEE ARTHROPLASTY;  Surgeon: Donato Heinz, MD;  Location: [facility];   Service: Orthopedics;  Laterality: Left;   KNEE ARTHROPLASTY Right 03/15/2017   Procedure: COMPUTER ASSISTED TOTAL KNEE ARTHROPLASTY;  Surgeon: Donato Heinz, MD;  Location: [facility];  Service: Orthopedics;  Laterality: Right;   KNEE ARTHROSCOPY Left    LIPOSUCTION     PELVIC FLOOR REPAIR     SHOULDER ARTHROSCOPY WITH DEBRIDEMENT AND BICEP TENDON REPAIR Left 09/24/2019   Procedure: Left shoulder arthroscopy with debridement, decompression, rotator cuff repair, and possible biceps tenodesis.;  Surgeon: Christena Flake, MD;  Location: [facility];  Service: Orthopedics;  Laterality: Left;   TOE SURGERY Right    TONSILLECTOMY     VARICOSE VEIN SURGERY     Patient Active Problem List   Diagnosis Date Noted   Diverticulitis large intestine 05/08/2022   S/P total knee arthroplasty 11/14/2016    PCP: Lynnea Ferrier, MD  REFERRING PROVIDER: Dayton Bailiff, PA  REFERRING DIAG: right groin pain, acute bilateral low back pain without sciatica, left hip pain, weakness of both legs  Rationale for Evaluation and Treatment: Rehabilitation  THERAPY DIAG:  Other low back pain  Other abnormalities of gait and mobility  Chronic pain of both knees  ONSET DATE: possibly chronic but current exacerbation started approximately January 2024  PERTINENT HISTORY:  Patient is a 82 y.o. female  who presents to outpatient physical therapy with a referral for medical diagnosis right groin pain, acute bilateral low back pain without sciatica, left hip pain, weakness of both legs. This patient's chief complaints consist of difficulty with mobility, unsteadiness on her feet, falls, low back pain with prolonged standing, and anterior knee pain with kneeling, leading to the following functional deficits: difficulty with kneeling, getting up from the ground, not falling, caring for her husband, stairs, walking, getting up from sitting, bending, lifting, carrying, gardening, cooking, prolonged standing. Relevant past  medical history and comorbidities include plantar fasciitis, right foot pain (3 surgeries), osteopenia she has S/P total knee arthroplasty and Diverticulitis large intestine on their problem list. She  has a past medical history of Anemia, Anxiety, Arthritis, Cancer (HCC) (1999), GERD (gastroesophageal reflux disease), History of kidney stones, HOH (hard of hearing), Hypertension, Hypothyroidism, PONV (postoperative nausea and vomiting), and Tinnitus. She  has a past surgical history that includes Hemorroidectomy; Knee arthroscopy (Left); Liposuction; Blepharoplasty; Toe Surgery (Right); Varicose vein surgery; basil cell; Pelvic floor repair; Abdominal hysterectomy; Knee Arthroplasty (Left, 11/14/2016); Knee Arthroplasty (Right, 03/15/2017); total knee replacement (Bilateral, 11/2016); Cystocele repair (2005); Tonsillectomy; Shoulder arthroscopy with debridement and bicep tendon repair (Left, 09/24/2019); Cataract extraction w/PHACO (Left, 08/26/2020); and Cataract extraction w/PHACO (Right, 09/09/2020). Patient denies hx of stroke, seizures, lung problems, heart problems, diabetes, unexplained weight loss, unexplained changes in bowel or bladder problems, and spinal surgery  SUBJECTIVE:                                                                                                                                                                                           SUBJECTIVE STATEMENT: Patient reports she is doing okay today. She just got back from an appointment with Dr. Rosita Kea about her finger that was hurting her before. She states it is not hurting her now but it has a lot of arthritis in it an she has a splint to wear at night. She states the rest of her body is feeling okay. She states Dr. Rosita Kea explained the difference between what bothers people more when they are old an young. She reports no falls. She did her sit <> stands today while waiting for Dr. Rosita Kea.   PAIN:  NPRS: 0/10  PRECAUTIONS:  Fall  PATIENT GOALS: "stay on my feet" "be able to get up out of a chair" improve her core strength  OBJECTIVE  TODAY'S TREATMENT:     Neuromuscular Re-education: to improve, balance, postural strength, muscle activation patterns, and stabilization strength required for functional activities: - cone tip/right 1x10 each side SBA. - double leg hops on mini-trampoline with BUE support  at TM bar, 10 bouts of 5-20 hops in a row (goal 20 each time)  - ball toss at rebounder:  - Step-standing with one foot on flat side of large half spike ball, 1x20 each side with small pink theraball, SBA and ball retrieval assist. Also attempted with 2kg med ball, but too challenging.   Therapeutic activities: dynamic exercise for functional strengthening and improved functional activity tolerance. - attempted lateral up and and over 4 inch aerobic step with B UE with hop at top (unable to hop).  - lateral up and over with B UE support, 1x10 each direction with 8 inch step (heavy effort) - lateral up an over for speed with B UE support, 1x10 each direction with 6 inch step.   Pt required multimodal cuing for proper technique and to facilitate improved neuromuscular control, strength, range of motion, and functional ability resulting in improved performance and form.  PATIENT EDUCATION:  Education details: Exercise purpose/form. Self management techniques. Education on diagnosis, prognosis, POC, anatomy and physiology of current condition. Education on HEP including handout. Person educated: Patient Education method: Explanation, Demonstration, Tactile cues, Verbal cues, and Handouts Education comprehension: verbalized understanding, returned demonstration, and needs further education  HOME EXERCISE PROGRAM: Access Code: [email or web address] URL: [email or web address] Date: 01/30/2023 Prepared by: Norton Blizzard  Exercises - Squat with Chair and Counter Support  - 3-5 x weekly - 3 sets - 10 reps -  Standing March with Unilateral Counter Support  - 3-5 x weekly - 3 sets - 10 reps - Standing Knee Flexion with Unilateral Counter Support  - 3-5 x weekly - 3 sets - 10 reps - Standing Hip Abduction with Unilateral Counter Support  - 3-5 x weekly - 3 sets - 10 reps - Heel Toe Raises with Unilateral Counter Support  - 3-5 x weekly - 3 sets - 10 reps  HOME EXERCISE PROGRAM [[email or web address]] View at "[email or web address]" using code: [email or web address] Half Kneel on Padded Chair  -  Repeat 10 Repetitions, Hold 5 Seconds, Complete 2 Sets, Perform 1 Times a Day  HOME EXERCISE PROGRAM [[email or web address]] View at "[email or web address]" using code: [email or web address] DYNAMIC SINGLE LIMB STANCE - CONE KNOCK DOWN AND SET UP -  Repeat 10 Repetitions, Hold 1 Second(s), Complete 1 Set, Perform 1 Times a Day  ASSESSMENT:  CLINICAL IMPRESSION:  Patient arrives continuing to report good tolerance to HEP and physical therapy sessions. She continued working on functional strength, power, and balance to improve her safety and functional activity tolerance. She is showing improving hip strength and LE power and balance but still struggles with single leg stability, strength, and ability to push off of her feet quickly enough to catch herself with loss of balance. Patient would benefit from continued management of limiting condition by skilled physical therapist to address remaining impairments and functional limitations to work towards stated goals and return to PLOF or maximal functional independence.     OBJECTIVE IMPAIRMENTS: Abnormal gait, decreased activity tolerance, decreased balance, decreased endurance, decreased knowledge of condition, decreased knowledge of use of DME, decreased mobility, difficulty walking, decreased ROM, decreased strength, hypomobility, increased edema, impaired perceived functional ability, impaired flexibility, obesity, and pain.   ACTIVITY LIMITATIONS: carrying, lifting, bending, standing, squatting, stairs, transfers,  bed mobility, dressing, locomotion level, and caring for others  PARTICIPATION LIMITATIONS: meal prep, cleaning, laundry, interpersonal relationship, shopping, community activity, yard work, and   difficulty with kneeling, getting up from the ground, not falling, caring for her husband, stairs, walking, getting up from sitting, bending, lifting, carrying, gardening,  cooking, prolonged standing  PERSONAL FACTORS: Age, Fitness, Past/current experiences, Time since onset of injury/illness/exacerbation, and 3+ comorbidities:   plantar fasciitis, right foot pain (3 surgeries), osteopenia she has S/P total knee arthroplasty and Diverticulitis large intestine on their problem list. She  has a past medical history of Anemia, Anxiety, Arthritis, Cancer (HCC) (1999), GERD (gastroesophageal reflux disease), History of kidney stones, HOH (hard of hearing), Hypertension, Hypothyroidism, PONV (postoperative nausea and vomiting), and Tinnitus. She  has a past surgical history that includes Hemorroidectomy; Knee arthroscopy (Left); Liposuction; Blepharoplasty; Toe Surgery (Right); Varicose vein surgery; basil cell; Pelvic floor repair; Abdominal hysterectomy; Knee Arthroplasty (Left, 11/14/2016); Knee Arthroplasty (Right, 03/15/2017); total knee replacement (Bilateral, 11/2016); Cystocele repair (2005); Tonsillectomy; Shoulder arthroscopy with debridement and bicep tendon repair (Left, 09/24/2019); Cataract extraction w/PHACO (Left, 08/26/2020); and Cataract extraction w/PHACO (Right, 09/09/2020) are also affecting patient's functional outcome.   REHAB POTENTIAL: Good  CLINICAL DECISION MAKING: Evolving/moderate complexity  EVALUATION COMPLEXITY: Moderate   GOALS: Goals reviewed with patient? No  SHORT TERM GOALS: Target date: 12/14/2022  Patient will be independent with initial home exercise program for self-management of symptoms. Baseline: Initial HEP provided at IE (11/30/22); Goal status: In-progress   LONG TERM  GOALS: Target date: 02/22/2023  Patient will be independent with a long-term home exercise program for self-management of symptoms.  Baseline: Initial HEP provided, longterm program still pending  Goal status: In-progress  2.  Patient will demonstrate improved FOTO by equal or greater than 10 points by visit #10 to demonstrate improvement in overall condition and self-reported functional ability.  Baseline: 56 (11/30/22); 01/11/23: 51 (pt denies feeling her balance has gotten worse) Goal status: Not Met   3.  Patient will complete 5 Times Sit To Stand Test in equal or less than 15 seconds from 18.5 inch or less surface with no LOB or use of B UE to improve her ability to get around her home safely and carry items when getting up from a seated position.  Baseline: 23 seconds with frequent LOB backwards including leaning against plinth and one failed attempt. No use of B UE on the plinth. From 18.5 inch plinth (11/30/22); 01/11/23:17sec from 18.5" chair height hands free Goal status: In-progress  4.  Patient will demonstrate the ability to complete the Floor Transfer Test in equal or less than 8.8 seconds to improve her ability to get up from the ground when gardening or after a fall.   Baseline: to be tested at a later date (11/30/22); unable to complete floor transfer test without two person assistance (12/13/2022); 9/4: did not test, has not been a targeted intervention as of yet   Goal status: In-progress  5.  Patient will complete community, work and/or recreational activities with 50% less limitation due to current condition.  Baseline: difficulty with kneeling, getting up from the ground, not falling, caring for her husband, stairs, walking, getting up from sitting, bending, lifting, carrying, gardening, cooking, prolonged standing (11/30/22);01/11/23: pt reports about the same, no big change here  Goal status: In-progress  6.  Patient will score equal or greater than 25 on the Functional Gait  Assessment to demonstrate low fall risk.  Baseline: to be tested at later date as appropriate (11/30/2022); 01/11/23: FGA =19 Goal status: In-progress   PLAN:  PT FREQUENCY: 1-2x/week  PT DURATION: 12 weeks  PLANNED INTERVENTIONS: Therapeutic exercises, Therapeutic activity, Neuromuscular re-education, Balance training, Gait training, Patient/Family education, Self Care, Joint mobilization, Stair training, DME instructions, Dry Needling, Cognitive remediation, Electrical stimulation, Spinal mobilization,  Cryotherapy, Moist heat, Manual therapy, and Re-evaluation.  PLAN FOR NEXT SESSION: Update HEP as appropriate, progressive LE/core/functional strengthening and balance exercises. Education. Manual therapy as needed.    Cira Rue, PT,DPT 02/01/2023, 12:08 PM  [facility] [address] [place], [place] [address] P: [phone] I F: [phone]

## 2023-02-06 ENCOUNTER — Ambulatory Visit: Payer: HMO | Admitting: Physical Therapy

## 2023-02-06 ENCOUNTER — Encounter: Payer: Self-pay | Admitting: Physical Therapy

## 2023-02-06 DIAGNOSIS — Z9181 History of falling: Secondary | ICD-10-CM

## 2023-02-06 DIAGNOSIS — M6281 Muscle weakness (generalized): Secondary | ICD-10-CM

## 2023-02-06 DIAGNOSIS — M5459 Other low back pain: Secondary | ICD-10-CM | POA: Diagnosis not present

## 2023-02-06 DIAGNOSIS — G8929 Other chronic pain: Secondary | ICD-10-CM

## 2023-02-06 DIAGNOSIS — R2689 Other abnormalities of gait and mobility: Secondary | ICD-10-CM

## 2023-02-06 NOTE — Therapy (Signed)
OUTPATIENT PHYSICAL THERAPY TREATMENT    Patient Name: Mary Elliott MRN: [id number] DOB:12-26-1940, 82 y.o., female Today's Date: 02/06/2023  END OF SESSION:  PT End of Session - 02/06/23 1119     Visit Number 17    Number of Visits 24    Date for PT Re-Evaluation 02/22/23    Authorization Type [employer] reporting period from 01/11/2023    Progress Note Due on Visit 20    PT Start Time 1118    PT Stop Time 1205    PT Time Calculation (min) 47 min    Activity Tolerance Patient tolerated treatment well;No increased pain    Behavior During Therapy WFL for tasks assessed/performed                  Past Medical History:  Diagnosis Date   Anemia    Anxiety    Arthritis    fingers   Cancer (HCC) 1999   basil cell on face   GERD (gastroesophageal reflux disease)    history of   History of kidney stones    twice   HOH (hard of hearing)    Hypertension    Hypothyroidism    PONV (postoperative nausea and vomiting)    nausea only with 2 prior surgeries   Tinnitus    Past Surgical History:  Procedure Laterality Date   ABDOMINAL HYSTERECTOMY     basil cell     face   BLEPHAROPLASTY     CATARACT EXTRACTION W/PHACO Left 08/26/2020   Procedure: CATARACT EXTRACTION PHACO AND INTRAOCULAR LENS PLACEMENT (IOC) LEFT PANOPTIX TORIC;  Surgeon: Lockie Mola, MD;  Location: [facility];  Service: Ophthalmology;  Laterality: Left;  8.88 1.:27.2 10.1%   CATARACT EXTRACTION W/PHACO Right 09/09/2020   Procedure: CATARACT EXTRACTION PHACO AND INTRAOCULAR LENS PLACEMENT (IOC) RIGHT PANOPTIX TORIC 8.54 01:03.3 13.5%;  Surgeon: Lockie Mola, MD;  Location: [facility];  Service: Ophthalmology;  Laterality: Right;   CYSTOCELE REPAIR  2005   HEMORROIDECTOMY     JOINT REPLACEMENT Bilateral 11/2016   KNEE ARTHROPLASTY Left 11/14/2016   Procedure: COMPUTER ASSISTED TOTAL KNEE ARTHROPLASTY;  Surgeon: Donato Heinz, MD;  Location: [facility];   Service: Orthopedics;  Laterality: Left;   KNEE ARTHROPLASTY Right 03/15/2017   Procedure: COMPUTER ASSISTED TOTAL KNEE ARTHROPLASTY;  Surgeon: Donato Heinz, MD;  Location: [facility];  Service: Orthopedics;  Laterality: Right;   KNEE ARTHROSCOPY Left    LIPOSUCTION     PELVIC FLOOR REPAIR     SHOULDER ARTHROSCOPY WITH DEBRIDEMENT AND BICEP TENDON REPAIR Left 09/24/2019   Procedure: Left shoulder arthroscopy with debridement, decompression, rotator cuff repair, and possible biceps tenodesis.;  Surgeon: Christena Flake, MD;  Location: [facility];  Service: Orthopedics;  Laterality: Left;   TOE SURGERY Right    TONSILLECTOMY     VARICOSE VEIN SURGERY     Patient Active Problem List   Diagnosis Date Noted   Diverticulitis large intestine 05/08/2022   S/P total knee arthroplasty 11/14/2016    PCP: Lynnea Ferrier, MD  REFERRING PROVIDER: Dayton Bailiff, PA  REFERRING DIAG: right groin pain, acute bilateral low back pain without sciatica, left hip pain, weakness of both legs  Rationale for Evaluation and Treatment: Rehabilitation  THERAPY DIAG:  Other low back pain  Other abnormalities of gait and mobility  Chronic pain of both knees  History of falling  Muscle weakness (generalized)  ONSET DATE: possibly chronic but current exacerbation started approximately January 2024  PERTINENT HISTORY:  Patient is a 82 y.o. female who presents to outpatient physical therapy with a referral for medical diagnosis right groin pain, acute bilateral low back pain without sciatica, left hip pain, weakness of both legs. This patient's chief complaints consist of difficulty with mobility, unsteadiness on her feet, falls, low back pain with prolonged standing, and anterior knee pain with kneeling, leading to the following functional deficits: difficulty with kneeling, getting up from the ground, not falling, caring for her husband, stairs, walking, getting up from sitting, bending, lifting, carrying,  gardening, cooking, prolonged standing. Relevant past medical history and comorbidities include plantar fasciitis, right foot pain (3 surgeries), osteopenia she has S/P total knee arthroplasty and Diverticulitis large intestine on their problem list. She  has a past medical history of Anemia, Anxiety, Arthritis, Cancer (HCC) (1999), GERD (gastroesophageal reflux disease), History of kidney stones, HOH (hard of hearing), Hypertension, Hypothyroidism, PONV (postoperative nausea and vomiting), and Tinnitus. She  has a past surgical history that includes Hemorroidectomy; Knee arthroscopy (Left); Liposuction; Blepharoplasty; Toe Surgery (Right); Varicose vein surgery; basil cell; Pelvic floor repair; Abdominal hysterectomy; Knee Arthroplasty (Left, 11/14/2016); Knee Arthroplasty (Right, 03/15/2017); total knee replacement (Bilateral, 11/2016); Cystocele repair (2005); Tonsillectomy; Shoulder arthroscopy with debridement and bicep tendon repair (Left, 09/24/2019); Cataract extraction w/PHACO (Left, 08/26/2020); and Cataract extraction w/PHACO (Right, 09/09/2020). Patient denies hx of stroke, seizures, lung problems, heart problems, diabetes, unexplained weight loss, unexplained changes in bowel or bladder problems, and spinal surgery  SUBJECTIVE:                                                                                                                                                                                           SUBJECTIVE STATEMENT: Patient states she tried to remember how she felt after last PT session. She states she was sore the next day but it was not disabling and she has more important things to think about than herself.   PAIN:  NPRS: 0/10  PRECAUTIONS: Fall  PATIENT GOALS: "stay on my feet" "be able to get up out of a chair" improve her core strength  OBJECTIVE  TODAY'S TREATMENT:     Neuromuscular Re-education: to improve, balance, postural strength, muscle activation patterns, and  stabilization strength required for functional activities: - cone tip/right 1x10 each side SBA. - double leg hops on mini-trampoline with BUE support at TM bar, 10 bouts of 13-24 hops in a row (goal 20 each time) - education on safety and purpose of hopping in the context of urinary incontinence (urge syptoms reported) given history of cystocele with surgical repair and mesh placement.   - ball toss at rebounder:  -  Step-standing with one foot on flat side of large half spike ball, 2x20 each side with small pink theraball, SBA and ball retrieval assist. Also attempted with 2kg med ball, but too challenging.    Pt required multimodal cuing for proper technique and to facilitate improved neuromuscular control, strength, range of motion, and functional ability resulting in improved performance and form.  PATIENT EDUCATION:  Education details: Exercise purpose/form. Self management techniques. Education on diagnosis, prognosis, POC, anatomy and physiology of current condition. Education on HEP including handout. Person educated: Patient Education method: Explanation, Demonstration, Tactile cues, Verbal cues, and Handouts Education comprehension: verbalized understanding, returned demonstration, and needs further education  HOME EXERCISE PROGRAM: Access Code: [email or web address] URL: [email or web address] Date: 01/30/2023 Prepared by: Norton Blizzard  Exercises - Squat with Chair and Counter Support  - 3-5 x weekly - 3 sets - 10 reps - Standing March with Unilateral Counter Support  - 3-5 x weekly - 3 sets - 10 reps - Standing Knee Flexion with Unilateral Counter Support  - 3-5 x weekly - 3 sets - 10 reps - Standing Hip Abduction with Unilateral Counter Support  - 3-5 x weekly - 3 sets - 10 reps - Heel Toe Raises with Unilateral Counter Support  - 3-5 x weekly - 3 sets - 10 reps  HOME EXERCISE PROGRAM [[email or web address]] View at "[email or web address]" using code: [email or web address] Half Kneel on Padded Chair  -   Repeat 10 Repetitions, Hold 5 Seconds, Complete 2 Sets, Perform 1 Times a Day  HOME EXERCISE PROGRAM [[email or web address]] View at "[email or web address]" using code: [email or web address] DYNAMIC SINGLE LIMB STANCE - CONE KNOCK DOWN AND SET UP -  Repeat 10 Repetitions, Hold 1 Second(s), Complete 1 Set, Perform 1 Times a Day  ASSESSMENT:  CLINICAL IMPRESSION:  Patient arrives continuing to report stress with her husband's heath and behavior that affects her wellbeing. Continued with interventions to improve functional strength and balance. Patient tolerated well and continues to be challenged appropriately. She continues to have no increased urinary incontinence symptoms during sessions and ambivalent when asked whether she is having increased symptoms in her daily life. Patient would benefit from continued management of limiting condition by skilled physical therapist to address remaining impairments and functional limitations to work towards stated goals and return to PLOF or maximal functional independence.    OBJECTIVE IMPAIRMENTS: Abnormal gait, decreased activity tolerance, decreased balance, decreased endurance, decreased knowledge of condition, decreased knowledge of use of DME, decreased mobility, difficulty walking, decreased ROM, decreased strength, hypomobility, increased edema, impaired perceived functional ability, impaired flexibility, obesity, and pain.   ACTIVITY LIMITATIONS: carrying, lifting, bending, standing, squatting, stairs, transfers, bed mobility, dressing, locomotion level, and caring for others  PARTICIPATION LIMITATIONS: meal prep, cleaning, laundry, interpersonal relationship, shopping, community activity, yard work, and   difficulty with kneeling, getting up from the ground, not falling, caring for her husband, stairs, walking, getting up from sitting, bending, lifting, carrying, gardening, cooking, prolonged standing  PERSONAL FACTORS: Age, Fitness, Past/current experiences, Time since onset  of injury/illness/exacerbation, and 3+ comorbidities:   plantar fasciitis, right foot pain (3 surgeries), osteopenia she has S/P total knee arthroplasty and Diverticulitis large intestine on their problem list. She  has a past medical history of Anemia, Anxiety, Arthritis, Cancer (HCC) (1999), GERD (gastroesophageal reflux disease), History of kidney stones, HOH (hard of hearing), Hypertension, Hypothyroidism, PONV (postoperative nausea and vomiting), and Tinnitus. She  has a past surgical history that includes Hemorroidectomy; Knee arthroscopy (Left); Liposuction; Blepharoplasty; Toe Surgery (Right); Varicose vein surgery; basil cell; Pelvic floor  repair; Abdominal hysterectomy; Knee Arthroplasty (Left, 11/14/2016); Knee Arthroplasty (Right, 03/15/2017); total knee replacement (Bilateral, 11/2016); Cystocele repair (2005); Tonsillectomy; Shoulder arthroscopy with debridement and bicep tendon repair (Left, 09/24/2019); Cataract extraction w/PHACO (Left, 08/26/2020); and Cataract extraction w/PHACO (Right, 09/09/2020) are also affecting patient's functional outcome.   REHAB POTENTIAL: Good  CLINICAL DECISION MAKING: Evolving/moderate complexity  EVALUATION COMPLEXITY: Moderate   GOALS: Goals reviewed with patient? No  SHORT TERM GOALS: Target date: 12/14/2022  Patient will be independent with initial home exercise program for self-management of symptoms. Baseline: Initial HEP provided at IE (11/30/22); Goal status: In-progress   LONG TERM GOALS: Target date: 02/22/2023  Patient will be independent with a long-term home exercise program for self-management of symptoms.  Baseline: Initial HEP provided, longterm program still pending  Goal status: In-progress  2.  Patient will demonstrate improved FOTO by equal or greater than 10 points by visit #10 to demonstrate improvement in overall condition and self-reported functional ability.  Baseline: 56 (11/30/22); 01/11/23: 51 (pt denies feeling her balance  has gotten worse) Goal status: Not Met   3.  Patient will complete 5 Times Sit To Stand Test in equal or less than 15 seconds from 18.5 inch or less surface with no LOB or use of B UE to improve her ability to get around her home safely and carry items when getting up from a seated position.  Baseline: 23 seconds with frequent LOB backwards including leaning against plinth and one failed attempt. No use of B UE on the plinth. From 18.5 inch plinth (11/30/22); 01/11/23:17sec from 18.5" chair height hands free Goal status: In-progress  4.  Patient will demonstrate the ability to complete the Floor Transfer Test in equal or less than 8.8 seconds to improve her ability to get up from the ground when gardening or after a fall.   Baseline: to be tested at a later date (11/30/22); unable to complete floor transfer test without two person assistance (12/13/2022); 9/4: did not test, has not been a targeted intervention as of yet   Goal status: In-progress  5.  Patient will complete community, work and/or recreational activities with 50% less limitation due to current condition.  Baseline: difficulty with kneeling, getting up from the ground, not falling, caring for her husband, stairs, walking, getting up from sitting, bending, lifting, carrying, gardening, cooking, prolonged standing (11/30/22);01/11/23: pt reports about the same, no big change here  Goal status: In-progress  6.  Patient will score equal or greater than 25 on the Functional Gait Assessment to demonstrate low fall risk.  Baseline: to be tested at later date as appropriate (11/30/2022); 01/11/23: FGA =19 Goal status: In-progress   PLAN:  PT FREQUENCY: 1-2x/week  PT DURATION: 12 weeks  PLANNED INTERVENTIONS: Therapeutic exercises, Therapeutic activity, Neuromuscular re-education, Balance training, Gait training, Patient/Family education, Self Care, Joint mobilization, Stair training, DME instructions, Dry Needling, Cognitive remediation,  Electrical stimulation, Spinal mobilization, Cryotherapy, Moist heat, Manual therapy, and Re-evaluation.  PLAN FOR NEXT SESSION: Update HEP as appropriate, progressive LE/core/functional strengthening and balance exercises. Education. Manual therapy as needed.    Cira Rue, PT,DPT 02/06/2023, 12:59 PM  [facility] [address] [place], [place] [address] P: [phone] I F: [phone]

## 2023-02-08 ENCOUNTER — Encounter: Payer: HMO | Admitting: Physical Therapy

## 2023-02-08 ENCOUNTER — Ambulatory Visit: Payer: HMO | Attending: Physician Assistant | Admitting: Physical Therapy

## 2023-02-08 ENCOUNTER — Encounter: Payer: Self-pay | Admitting: Physical Therapy

## 2023-02-08 DIAGNOSIS — M25561 Pain in right knee: Secondary | ICD-10-CM | POA: Diagnosis not present

## 2023-02-08 DIAGNOSIS — Z9181 History of falling: Secondary | ICD-10-CM | POA: Diagnosis not present

## 2023-02-08 DIAGNOSIS — M25562 Pain in left knee: Secondary | ICD-10-CM | POA: Insufficient documentation

## 2023-02-08 DIAGNOSIS — M6281 Muscle weakness (generalized): Secondary | ICD-10-CM | POA: Diagnosis not present

## 2023-02-08 DIAGNOSIS — G8929 Other chronic pain: Secondary | ICD-10-CM | POA: Diagnosis not present

## 2023-02-08 DIAGNOSIS — R2689 Other abnormalities of gait and mobility: Secondary | ICD-10-CM | POA: Diagnosis not present

## 2023-02-08 DIAGNOSIS — M5459 Other low back pain: Secondary | ICD-10-CM | POA: Insufficient documentation

## 2023-02-08 NOTE — Therapy (Signed)
OUTPATIENT PHYSICAL THERAPY TREATMENT    Patient Name: Mary Elliott MRN: [id number] DOB:[birth year], 82 y.o., female Today's Date: 02/08/2023  END OF SESSION:  PT End of Session - 02/08/23 1211     Visit Number 18    Number of Visits 24    Date for PT Re-Evaluation 02/22/23    Authorization Type [employer] reporting period from 01/11/2023    Progress Note Due on Visit 20    PT Start Time 1120    PT Stop Time 1200    PT Time Calculation (min) 40 min    Activity Tolerance Patient tolerated treatment well;No increased pain    Behavior During Therapy WFL for tasks assessed/performed              Past Medical History:  Diagnosis Date   Anemia    Anxiety    Arthritis    fingers   Cancer (HCC) 1999   basil cell on face   GERD (gastroesophageal reflux disease)    history of   History of kidney stones    twice   HOH (hard of hearing)    Hypertension    Hypothyroidism    PONV (postoperative nausea and vomiting)    nausea only with 2 prior surgeries   Tinnitus    Past Surgical History:  Procedure Laterality Date   ABDOMINAL HYSTERECTOMY     basil cell     face   BLEPHAROPLASTY     CATARACT EXTRACTION W/PHACO Left 08/26/2020   Procedure: CATARACT EXTRACTION PHACO AND INTRAOCULAR LENS PLACEMENT (IOC) LEFT PANOPTIX TORIC;  Surgeon: Lockie Mola, MD;  Location: [facility];  Service: Ophthalmology;  Laterality: Left;  8.88 1.:27.2 10.1%   CATARACT EXTRACTION W/PHACO Right 09/09/2020   Procedure: CATARACT EXTRACTION PHACO AND INTRAOCULAR LENS PLACEMENT (IOC) RIGHT PANOPTIX TORIC 8.54 01:03.3 13.5%;  Surgeon: Lockie Mola, MD;  Location: [facility];  Service: Ophthalmology;  Laterality: Right;   CYSTOCELE REPAIR  2005   HEMORROIDECTOMY     JOINT REPLACEMENT Bilateral 11/2016   KNEE ARTHROPLASTY Left 11/14/2016   Procedure: COMPUTER ASSISTED TOTAL KNEE ARTHROPLASTY;  Surgeon: Donato Heinz, MD;  Location: [facility];  Service:  Orthopedics;  Laterality: Left;   KNEE ARTHROPLASTY Right 03/15/2017   Procedure: COMPUTER ASSISTED TOTAL KNEE ARTHROPLASTY;  Surgeon: Donato Heinz, MD;  Location: [facility];  Service: Orthopedics;  Laterality: Right;   KNEE ARTHROSCOPY Left    LIPOSUCTION     PELVIC FLOOR REPAIR     SHOULDER ARTHROSCOPY WITH DEBRIDEMENT AND BICEP TENDON REPAIR Left 09/24/2019   Procedure: Left shoulder arthroscopy with debridement, decompression, rotator cuff repair, and possible biceps tenodesis.;  Surgeon: Christena Flake, MD;  Location: [facility];  Service: Orthopedics;  Laterality: Left;   TOE SURGERY Right    TONSILLECTOMY     VARICOSE VEIN SURGERY     Patient Active Problem List   Diagnosis Date Noted   Diverticulitis large intestine 05/08/2022   S/P total knee arthroplasty 11/14/2016    PCP: Lynnea Ferrier, MD  REFERRING PROVIDER: Dayton Bailiff, PA  REFERRING DIAG: right groin pain, acute bilateral low back pain without sciatica, left hip pain, weakness of both legs  Rationale for Evaluation and Treatment: Rehabilitation  THERAPY DIAG:  Other low back pain  Other abnormalities of gait and mobility  Chronic pain of both knees  History of falling  Muscle weakness (generalized)  ONSET DATE: possibly chronic but current exacerbation started approximately January 2024  PERTINENT HISTORY:  Patient  is a 82 y.o. female who presents to outpatient physical therapy with a referral for medical diagnosis right groin pain, acute bilateral low back pain without sciatica, left hip pain, weakness of both legs. This patient's chief complaints consist of difficulty with mobility, unsteadiness on her feet, falls, low back pain with prolonged standing, and anterior knee pain with kneeling, leading to the following functional deficits: difficulty with kneeling, getting up from the ground, not falling, caring for her husband, stairs, walking, getting up from sitting, bending, lifting, carrying,  gardening, cooking, prolonged standing. Relevant past medical history and comorbidities include plantar fasciitis, right foot pain (3 surgeries), osteopenia she has S/P total knee arthroplasty and Diverticulitis large intestine on their problem list. She  has a past medical history of Anemia, Anxiety, Arthritis, Cancer (HCC) (1999), GERD (gastroesophageal reflux disease), History of kidney stones, HOH (hard of hearing), Hypertension, Hypothyroidism, PONV (postoperative nausea and vomiting), and Tinnitus. She  has a past surgical history that includes Hemorroidectomy; Knee arthroscopy (Left); Liposuction; Blepharoplasty; Toe Surgery (Right); Varicose vein surgery; basil cell; Pelvic floor repair; Abdominal hysterectomy; Knee Arthroplasty (Left, 11/14/2016); Knee Arthroplasty (Right, 03/15/2017); total knee replacement (Bilateral, 11/2016); Cystocele repair (2005); Tonsillectomy; Shoulder arthroscopy with debridement and bicep tendon repair (Left, 09/24/2019); Cataract extraction w/PHACO (Left, 08/26/2020); and Cataract extraction w/PHACO (Right, 09/09/2020). Patient denies hx of stroke, seizures, lung problems, heart problems, diabetes, unexplained weight loss, unexplained changes in bowel or bladder problems, and spinal surgery  SUBJECTIVE:                                                                                                                                                                                           SUBJECTIVE STATEMENT: Patient states she has some pain in the the right heel that gets worse or better. She felt it quite a bit this morning. She is not hurting now.   PAIN:  NPRS: 0/10  PRECAUTIONS: Fall  PATIENT GOALS: "stay on my feet" "be able to get up out of a chair" improve her core strength  OBJECTIVE  TODAY'S TREATMENT:     Neuromuscular Re-education: to improve, balance, postural strength, muscle activation patterns, and stabilization strength required for functional  activities: - lateral stepping over small aerobic step, 2x10 with no UE support (available if needed), SBA, attempting to bring eyes up.  - lateral step up and over 6 inch step, 2x10 each direction, attempting to use less UE support, SBA.  - lateral step up and over 4 inch step, 1x10-15 each direction, attempting to use less UE support, and complete to beat of music. SBA.  - ball toss at rebounder:  - double leg  stance, firm surface, 2kg med ball, 1x20 - Step-standing with one foot on flat side of large half spike ball, 1x20 each side with 2kg med ball, SBA-minA and ball retrieval assist.  Therapeutic exercise: to centralize symptoms and improve ROM, strength, muscular endurance, and activity tolerance required for successful completion of functional activities.  - lateral stepping with RTB around ankles, 1x30 feet each direction, CGA.  - forwards monster walks with RTB around ankles, 2x30 feet, CGA - backwards monster walks with RTB around ankles, 1x30 feet, CGA  Pt required multimodal cuing for proper technique and to facilitate improved neuromuscular control, strength, range of motion, and functional ability resulting in improved performance and form.  PATIENT EDUCATION:  Education details: Exercise purpose/form. Self management techniques. Education on diagnosis, prognosis, POC, anatomy and physiology of current condition. Education on HEP including handout. Person educated: Patient Education method: Explanation, Demonstration, Tactile cues, Verbal cues, and Handouts Education comprehension: verbalized understanding, returned demonstration, and needs further education  HOME EXERCISE PROGRAM: Access Code: [email or web address] URL: [email or web address] Date: 01/30/2023 Prepared by: Norton Blizzard  Exercises - Squat with Chair and Counter Support  - 3-5 x weekly - 3 sets - 10 reps - Standing March with Unilateral Counter Support  - 3-5 x weekly - 3 sets - 10 reps - Standing Knee  Flexion with Unilateral Counter Support  - 3-5 x weekly - 3 sets - 10 reps - Standing Hip Abduction with Unilateral Counter Support  - 3-5 x weekly - 3 sets - 10 reps - Heel Toe Raises with Unilateral Counter Support  - 3-5 x weekly - 3 sets - 10 reps  HOME EXERCISE PROGRAM [[email or web address]] View at "[email or web address]" using code: [email or web address] Half Kneel on Padded Chair  -  Repeat 10 Repetitions, Hold 5 Seconds, Complete 2 Sets, Perform 1 Times a Day  HOME EXERCISE PROGRAM [[email or web address]] View at "[email or web address]" using code: [email or web address] DYNAMIC SINGLE LIMB STANCE - CONE KNOCK DOWN AND SET UP -  Repeat 10 Repetitions, Hold 1 Second(s), Complete 1 Set, Perform 1 Times a Day  ASSESSMENT:  CLINICAL IMPRESSION:  Patient arrives reporting some right heel pain this morning that seems to have increased recently. Today's session focused on lateral hip strength and balance exercises today with less pressure through the foot. Patient tolerated treatment well and was able to progress to 2kg med ball with ball toss. She had difficulty with timing stepping exercise to musical beat but demonstrated improvements with balance exercises overall. Patient would benefit from continued management of limiting condition by skilled physical therapist to address remaining impairments and functional limitations to work towards stated goals and return to PLOF or maximal functional independence.     OBJECTIVE IMPAIRMENTS: Abnormal gait, decreased activity tolerance, decreased balance, decreased endurance, decreased knowledge of condition, decreased knowledge of use of DME, decreased mobility, difficulty walking, decreased ROM, decreased strength, hypomobility, increased edema, impaired perceived functional ability, impaired flexibility, obesity, and pain.   ACTIVITY LIMITATIONS: carrying, lifting, bending, standing, squatting, stairs, transfers, bed mobility, dressing, locomotion level, and caring for others  PARTICIPATION LIMITATIONS:  meal prep, cleaning, laundry, interpersonal relationship, shopping, community activity, yard work, and   difficulty with kneeling, getting up from the ground, not falling, caring for her husband, stairs, walking, getting up from sitting, bending, lifting, carrying, gardening, cooking, prolonged standing  PERSONAL FACTORS: Age, Fitness, Past/current experiences, Time since onset of injury/illness/exacerbation, and 3+ comorbidities:   plantar fasciitis, right foot pain (3 surgeries), osteopenia she has S/P total knee arthroplasty and Diverticulitis large intestine on their  problem list. She  has a past medical history of Anemia, Anxiety, Arthritis, Cancer (HCC) (1999), GERD (gastroesophageal reflux disease), History of kidney stones, HOH (hard of hearing), Hypertension, Hypothyroidism, PONV (postoperative nausea and vomiting), and Tinnitus. She  has a past surgical history that includes Hemorroidectomy; Knee arthroscopy (Left); Liposuction; Blepharoplasty; Toe Surgery (Right); Varicose vein surgery; basil cell; Pelvic floor repair; Abdominal hysterectomy; Knee Arthroplasty (Left, 11/14/2016); Knee Arthroplasty (Right, 03/15/2017); total knee replacement (Bilateral, 11/2016); Cystocele repair (2005); Tonsillectomy; Shoulder arthroscopy with debridement and bicep tendon repair (Left, 09/24/2019); Cataract extraction w/PHACO (Left, 08/26/2020); and Cataract extraction w/PHACO (Right, 09/09/2020) are also affecting patient's functional outcome.   REHAB POTENTIAL: Good  CLINICAL DECISION MAKING: Evolving/moderate complexity  EVALUATION COMPLEXITY: Moderate   GOALS: Goals reviewed with patient? No  SHORT TERM GOALS: Target date: 12/14/2022  Patient will be independent with initial home exercise program for self-management of symptoms. Baseline: Initial HEP provided at IE (11/30/22); Goal status: In-progress   LONG TERM GOALS: Target date: 02/22/2023  Patient will be independent with a long-term home exercise  program for self-management of symptoms.  Baseline: Initial HEP provided, longterm program still pending  Goal status: In-progress  2.  Patient will demonstrate improved FOTO by equal or greater than 10 points by visit #10 to demonstrate improvement in overall condition and self-reported functional ability.  Baseline: 56 (11/30/22); 01/11/23: 51 (pt denies feeling her balance has gotten worse) Goal status: Not Met   3.  Patient will complete 5 Times Sit To Stand Test in equal or less than 15 seconds from 18.5 inch or less surface with no LOB or use of B UE to improve her ability to get around her home safely and carry items when getting up from a seated position.  Baseline: 23 seconds with frequent LOB backwards including leaning against plinth and one failed attempt. No use of B UE on the plinth. From 18.5 inch plinth (11/30/22); 01/11/23:17sec from 18.5" chair height hands free Goal status: In-progress  4.  Patient will demonstrate the ability to complete the Floor Transfer Test in equal or less than 8.8 seconds to improve her ability to get up from the ground when gardening or after a fall.   Baseline: to be tested at a later date (11/30/22); unable to complete floor transfer test without two person assistance (12/13/2022); 9/4: did not test, has not been a targeted intervention as of yet   Goal status: In-progress  5.  Patient will complete community, work and/or recreational activities with 50% less limitation due to current condition.  Baseline: difficulty with kneeling, getting up from the ground, not falling, caring for her husband, stairs, walking, getting up from sitting, bending, lifting, carrying, gardening, cooking, prolonged standing (11/30/22);01/11/23: pt reports about the same, no big change here  Goal status: In-progress  6.  Patient will score equal or greater than 25 on the Functional Gait Assessment to demonstrate low fall risk.  Baseline: to be tested at later date as appropriate  (11/30/2022); 01/11/23: FGA =19 Goal status: In-progress   PLAN:  PT FREQUENCY: 1-2x/week  PT DURATION: 12 weeks  PLANNED INTERVENTIONS: Therapeutic exercises, Therapeutic activity, Neuromuscular re-education, Balance training, Gait training, Patient/Family education, Self Care, Joint mobilization, Stair training, DME instructions, Dry Needling, Cognitive remediation, Electrical stimulation, Spinal mobilization, Cryotherapy, Moist heat, Manual therapy, and Re-evaluation.  PLAN FOR NEXT SESSION: Update HEP as appropriate, progressive LE/core/functional strengthening and balance exercises. Education. Manual therapy as needed.    Cira Rue, PT,DPT 02/08/2023, 12:20 PM  [place]  [facility] [address] [place], [place] [address] P: [phone] I F: [phone]

## 2023-02-15 ENCOUNTER — Ambulatory Visit: Payer: HMO | Admitting: Physical Therapy

## 2023-02-15 DIAGNOSIS — M5459 Other low back pain: Secondary | ICD-10-CM | POA: Diagnosis not present

## 2023-02-15 DIAGNOSIS — Z9181 History of falling: Secondary | ICD-10-CM

## 2023-02-15 DIAGNOSIS — M6281 Muscle weakness (generalized): Secondary | ICD-10-CM

## 2023-02-15 DIAGNOSIS — G8929 Other chronic pain: Secondary | ICD-10-CM

## 2023-02-15 DIAGNOSIS — R2689 Other abnormalities of gait and mobility: Secondary | ICD-10-CM

## 2023-02-15 NOTE — Therapy (Signed)
OUTPATIENT PHYSICAL THERAPY TREATMENT    Patient Name: Mary Elliott MRN: [id number] DOB:[birth year], 82 y.o., female Today's Date: 02/15/2023  END OF SESSION:  PT End of Session - 02/15/23 1440     Visit Number 19    Number of Visits 24    Date for PT Re-Evaluation 02/22/23    Authorization Type [employer] reporting period from 01/11/2023    Progress Note Due on Visit 20    PT Start Time 1302    PT Stop Time 1344    PT Time Calculation (min) 42 min    Activity Tolerance Patient tolerated treatment well;No increased pain    Behavior During Therapy WFL for tasks assessed/performed               Past Medical History:  Diagnosis Date   Anemia    Anxiety    Arthritis    fingers   Cancer (HCC) 1999   basil cell on face   GERD (gastroesophageal reflux disease)    history of   History of kidney stones    twice   HOH (hard of hearing)    Hypertension    Hypothyroidism    PONV (postoperative nausea and vomiting)    nausea only with 2 prior surgeries   Tinnitus    Past Surgical History:  Procedure Laterality Date   ABDOMINAL HYSTERECTOMY     basil cell     face   BLEPHAROPLASTY     CATARACT EXTRACTION W/PHACO Left 08/26/2020   Procedure: CATARACT EXTRACTION PHACO AND INTRAOCULAR LENS PLACEMENT (IOC) LEFT PANOPTIX TORIC;  Surgeon: Lockie Mola, MD;  Location: [facility];  Service: Ophthalmology;  Laterality: Left;  8.88 1.:27.2 10.1%   CATARACT EXTRACTION W/PHACO Right 09/09/2020   Procedure: CATARACT EXTRACTION PHACO AND INTRAOCULAR LENS PLACEMENT (IOC) RIGHT PANOPTIX TORIC 8.54 01:03.3 13.5%;  Surgeon: Lockie Mola, MD;  Location: [facility];  Service: Ophthalmology;  Laterality: Right;   CYSTOCELE REPAIR  2005   HEMORROIDECTOMY     JOINT REPLACEMENT Bilateral 11/2016   KNEE ARTHROPLASTY Left 11/14/2016   Procedure: COMPUTER ASSISTED TOTAL KNEE ARTHROPLASTY;  Surgeon: Donato Heinz, MD;  Location: [facility];  Service:  Orthopedics;  Laterality: Left;   KNEE ARTHROPLASTY Right 03/15/2017   Procedure: COMPUTER ASSISTED TOTAL KNEE ARTHROPLASTY;  Surgeon: Donato Heinz, MD;  Location: [facility];  Service: Orthopedics;  Laterality: Right;   KNEE ARTHROSCOPY Left    LIPOSUCTION     PELVIC FLOOR REPAIR     SHOULDER ARTHROSCOPY WITH DEBRIDEMENT AND BICEP TENDON REPAIR Left 09/24/2019   Procedure: Left shoulder arthroscopy with debridement, decompression, rotator cuff repair, and possible biceps tenodesis.;  Surgeon: Christena Flake, MD;  Location: [facility];  Service: Orthopedics;  Laterality: Left;   TOE SURGERY Right    TONSILLECTOMY     VARICOSE VEIN SURGERY     Patient Active Problem List   Diagnosis Date Noted   Diverticulitis large intestine 05/08/2022   S/P total knee arthroplasty 11/14/2016    PCP: Lynnea Ferrier, MD  REFERRING PROVIDER: Dayton Bailiff, PA  REFERRING DIAG: right groin pain, acute bilateral low back pain without sciatica, left hip pain, weakness of both legs  Rationale for Evaluation and Treatment: Rehabilitation  THERAPY DIAG:  No diagnosis found.  ONSET DATE: possibly chronic but current exacerbation started approximately January 2024  PERTINENT HISTORY:  Patient is a 82 y.o. female who presents to outpatient physical therapy with a referral for medical diagnosis right groin pain, acute  bilateral low back pain without sciatica, left hip pain, weakness of both legs. This patient's chief complaints consist of difficulty with mobility, unsteadiness on her feet, falls, low back pain with prolonged standing, and anterior knee pain with kneeling, leading to the following functional deficits: difficulty with kneeling, getting up from the ground, not falling, caring for her husband, stairs, walking, getting up from sitting, bending, lifting, carrying, gardening, cooking, prolonged standing. Relevant past medical history and comorbidities include plantar fasciitis, right foot pain (3  surgeries), osteopenia she has S/P total knee arthroplasty and Diverticulitis large intestine on their problem list. She  has a past medical history of Anemia, Anxiety, Arthritis, Cancer (HCC) (1999), GERD (gastroesophageal reflux disease), History of kidney stones, HOH (hard of hearing), Hypertension, Hypothyroidism, PONV (postoperative nausea and vomiting), and Tinnitus. She  has a past surgical history that includes Hemorroidectomy; Knee arthroscopy (Left); Liposuction; Blepharoplasty; Toe Surgery (Right); Varicose vein surgery; basil cell; Pelvic floor repair; Abdominal hysterectomy; Knee Arthroplasty (Left, 11/14/2016); Knee Arthroplasty (Right, 03/15/2017); total knee replacement (Bilateral, 11/2016); Cystocele repair (2005); Tonsillectomy; Shoulder arthroscopy with debridement and bicep tendon repair (Left, 09/24/2019); Cataract extraction w/PHACO (Left, 08/26/2020); and Cataract extraction w/PHACO (Right, 09/09/2020). Patient denies hx of stroke, seizures, lung problems, heart problems, diabetes, unexplained weight loss, unexplained changes in bowel or bladder problems, and spinal surgery  SUBJECTIVE:                                                                                                                                                                                           SUBJECTIVE STATEMENT: Patient states she is feeling okay today. She states she had some days last week where she felt extra tired but this happens to her sometimes. She states she feels better today. She states she was sore after last PT session but she cannot remember where.   PAIN:  NPRS: 0/10  PRECAUTIONS: Fall  PATIENT GOALS: "stay on my feet" "be able to get up out of a chair" improve her core strength  OBJECTIVE  TODAY'S TREATMENT:     Neuromuscular Re-education: to improve, balance, postural strength, muscle activation patterns, and stabilization strength required for functional activities: - lateral stepping  over small aerobic step, 1x10 with no UE support (available if needed), SBA, attempting to bring eyes up.  - lateral step up and over 4 inch step, 1x10 each direction, no UE support, CGA-minA (for LOB).  - lateral step up and over 6 inch step, 2x10 each direction, no UE support, SBA-minA.   Therapeutic exercise: to centralize symptoms and improve ROM, strength, muscular endurance, and activity tolerance required for successful completion of functional activities.  -  staggered stance sit <> stand from 18 inch chair, 1x10 each side with focused practice on preventing use of momentum as much as possible.  - stance sit <> stand from 18 inch chair, 1x10 with focused practice on preventing use of momentum as much as possible.  - some squats with B UE support and attempted buttocks taps  Pt required multimodal cuing for proper technique and to facilitate improved neuromuscular control, strength, range of motion, and functional ability resulting in improved performance and form.  PATIENT EDUCATION:  Education details: Exercise purpose/form. Self management techniques. Education on diagnosis, prognosis, POC, anatomy and physiology of current condition. Education on HEP including handout. Person educated: Patient Education method: Explanation, Demonstration, Tactile cues, Verbal cues, and Handouts Education comprehension: verbalized understanding, returned demonstration, and needs further education  HOME EXERCISE PROGRAM: Access Code: [email or web address] URL: [email or web address] Date: 02/15/2023 Prepared by: Norton Blizzard  Exercises - Squat with Chair Touch  - 3-5 x weekly - 3 sets - 10 reps - Standing March with Unilateral Counter Support  - 3-5 x weekly - 3 sets - 10 reps - Standing Knee Flexion with Unilateral Counter Support  - 3-5 x weekly - 3 sets - 10 reps - Standing Hip Abduction with Unilateral Counter Support  - 3-5 x weekly - 3 sets - 10 reps - Heel Toe Raises with Unilateral Counter  Support  - 3-5 x weekly - 3 sets - 10 reps  HOME EXERCISE PROGRAM [[email or web address]] View at "[email or web address]" using code: [email or web address] Half Kneel on Padded Chair  -  Repeat 10 Repetitions, Hold 5 Seconds, Complete 2 Sets, Perform 1 Times a Day  HOME EXERCISE PROGRAM [[email or web address]] View at "[email or web address]" using code: [email or web address] DYNAMIC SINGLE LIMB STANCE - CONE KNOCK DOWN AND SET UP -  Repeat 10 Repetitions, Hold 1 Second(s), Complete 1 Set, Perform 1 Times a Day  ASSESSMENT:  CLINICAL IMPRESSION:  Today's session focused on improving functional and hip strengthening, especially improving depth of squat with forwards shift to improve quad and glute strength. Patient required a lot of cuing to decreased trunk swing with sit to stand and improve knee flexion and hip flexion during sit to stand. Patient would benefit from continued management of limiting condition by skilled physical therapist to address remaining impairments and functional limitations to work towards stated goals and return to PLOF or maximal functional independence.      OBJECTIVE IMPAIRMENTS: Abnormal gait, decreased activity tolerance, decreased balance, decreased endurance, decreased knowledge of condition, decreased knowledge of use of DME, decreased mobility, difficulty walking, decreased ROM, decreased strength, hypomobility, increased edema, impaired perceived functional ability, impaired flexibility, obesity, and pain.   ACTIVITY LIMITATIONS: carrying, lifting, bending, standing, squatting, stairs, transfers, bed mobility, dressing, locomotion level, and caring for others  PARTICIPATION LIMITATIONS: meal prep, cleaning, laundry, interpersonal relationship, shopping, community activity, yard work, and   difficulty with kneeling, getting up from the ground, not falling, caring for her husband, stairs, walking, getting up from sitting, bending, lifting, carrying, gardening, cooking, prolonged standing  PERSONAL FACTORS: Age,  Fitness, Past/current experiences, Time since onset of injury/illness/exacerbation, and 3+ comorbidities:   plantar fasciitis, right foot pain (3 surgeries), osteopenia she has S/P total knee arthroplasty and Diverticulitis large intestine on their problem list. She  has a past medical history of Anemia, Anxiety, Arthritis, Cancer (HCC) (1999), GERD (gastroesophageal reflux disease), History of kidney stones, HOH (hard of hearing), Hypertension, Hypothyroidism, PONV (postoperative nausea and vomiting), and Tinnitus. She  has a past surgical history that includes Hemorroidectomy; Knee arthroscopy (  Left); Liposuction; Blepharoplasty; Toe Surgery (Right); Varicose vein surgery; basil cell; Pelvic floor repair; Abdominal hysterectomy; Knee Arthroplasty (Left, 11/14/2016); Knee Arthroplasty (Right, 03/15/2017); total knee replacement (Bilateral, 11/2016); Cystocele repair (2005); Tonsillectomy; Shoulder arthroscopy with debridement and bicep tendon repair (Left, 09/24/2019); Cataract extraction w/PHACO (Left, 08/26/2020); and Cataract extraction w/PHACO (Right, 09/09/2020) are also affecting patient's functional outcome.   REHAB POTENTIAL: Good  CLINICAL DECISION MAKING: Evolving/moderate complexity  EVALUATION COMPLEXITY: Moderate   GOALS: Goals reviewed with patient? No  SHORT TERM GOALS: Target date: 12/14/2022  Patient will be independent with initial home exercise program for self-management of symptoms. Baseline: Initial HEP provided at IE (11/30/22); Goal status: In-progress   LONG TERM GOALS: Target date: 02/22/2023  Patient will be independent with a long-term home exercise program for self-management of symptoms.  Baseline: Initial HEP provided, longterm program still pending  Goal status: In-progress  2.  Patient will demonstrate improved FOTO by equal or greater than 10 points by visit #10 to demonstrate improvement in overall condition and self-reported functional ability.  Baseline: 56  (11/30/22); 01/11/23: 51 (pt denies feeling her balance has gotten worse) Goal status: Not Met   3.  Patient will complete 5 Times Sit To Stand Test in equal or less than 15 seconds from 18.5 inch or less surface with no LOB or use of B UE to improve her ability to get around her home safely and carry items when getting up from a seated position.  Baseline: 23 seconds with frequent LOB backwards including leaning against plinth and one failed attempt. No use of B UE on the plinth. From 18.5 inch plinth (11/30/22); 01/11/23:17sec from 18.5" chair height hands free Goal status: In-progress  4.  Patient will demonstrate the ability to complete the Floor Transfer Test in equal or less than 8.8 seconds to improve her ability to get up from the ground when gardening or after a fall.   Baseline: to be tested at a later date (11/30/22); unable to complete floor transfer test without two person assistance (12/13/2022); 9/4: did not test, has not been a targeted intervention as of yet   Goal status: In-progress  5.  Patient will complete community, work and/or recreational activities with 50% less limitation due to current condition.  Baseline: difficulty with kneeling, getting up from the ground, not falling, caring for her husband, stairs, walking, getting up from sitting, bending, lifting, carrying, gardening, cooking, prolonged standing (11/30/22);01/11/23: pt reports about the same, no big change here  Goal status: In-progress  6.  Patient will score equal or greater than 25 on the Functional Gait Assessment to demonstrate low fall risk.  Baseline: to be tested at later date as appropriate (11/30/2022); 01/11/23: FGA =19 Goal status: In-progress   PLAN:  PT FREQUENCY: 1-2x/week  PT DURATION: 12 weeks  PLANNED INTERVENTIONS: Therapeutic exercises, Therapeutic activity, Neuromuscular re-education, Balance training, Gait training, Patient/Family education, Self Care, Joint mobilization, Stair training, DME  instructions, Dry Needling, Cognitive remediation, Electrical stimulation, Spinal mobilization, Cryotherapy, Moist heat, Manual therapy, and Re-evaluation.  PLAN FOR NEXT SESSION: Update HEP as appropriate, progressive LE/core/functional strengthening and balance exercises. Education. Manual therapy as needed.    Cira Rue, PT,DPT 02/15/2023, 2:41 PM  [facility] [address] [place], [place] [address] P: [phone] I F: [phone]

## 2023-02-23 ENCOUNTER — Ambulatory Visit: Payer: HMO | Admitting: Physical Therapy

## 2023-02-23 DIAGNOSIS — M5459 Other low back pain: Secondary | ICD-10-CM

## 2023-02-23 DIAGNOSIS — Z9181 History of falling: Secondary | ICD-10-CM

## 2023-02-23 DIAGNOSIS — R2689 Other abnormalities of gait and mobility: Secondary | ICD-10-CM

## 2023-02-23 DIAGNOSIS — M6281 Muscle weakness (generalized): Secondary | ICD-10-CM

## 2023-02-23 DIAGNOSIS — G8929 Other chronic pain: Secondary | ICD-10-CM

## 2023-02-23 NOTE — Therapy (Signed)
OUTPATIENT PHYSICAL THERAPY TREATMENT    Patient Name: Mary Elliott MRN: [id number] DOB:1941/02/27, 82 y.o., female Today's Date: 02/23/2023  END OF SESSION:  PT End of Session - 02/23/23 1303     Visit Number 20    Number of Visits 40    Date for PT Re-Evaluation 05/18/23    Authorization Type [employer] reporting period from 01/11/2023    Progress Note Due on Visit 20    PT Start Time 1300    PT Stop Time 1340    PT Time Calculation (min) 40 min    Activity Tolerance Patient tolerated treatment well;No increased pain    Behavior During Therapy WFL for tasks assessed/performed                Past Medical History:  Diagnosis Date   Anemia    Anxiety    Arthritis    fingers   Cancer (HCC) 1999   basil cell on face   GERD (gastroesophageal reflux disease)    history of   History of kidney stones    twice   HOH (hard of hearing)    Hypertension    Hypothyroidism    PONV (postoperative nausea and vomiting)    nausea only with 2 prior surgeries   Tinnitus    Past Surgical History:  Procedure Laterality Date   ABDOMINAL HYSTERECTOMY     basil cell     face   BLEPHAROPLASTY     CATARACT EXTRACTION W/PHACO Left 08/26/2020   Procedure: CATARACT EXTRACTION PHACO AND INTRAOCULAR LENS PLACEMENT (IOC) LEFT PANOPTIX TORIC;  Surgeon: Lockie Mola, MD;  Location: [facility];  Service: Ophthalmology;  Laterality: Left;  8.88 1.:27.2 10.1%   CATARACT EXTRACTION W/PHACO Right 09/09/2020   Procedure: CATARACT EXTRACTION PHACO AND INTRAOCULAR LENS PLACEMENT (IOC) RIGHT PANOPTIX TORIC 8.54 01:03.3 13.5%;  Surgeon: Lockie Mola, MD;  Location: [facility];  Service: Ophthalmology;  Laterality: Right;   CYSTOCELE REPAIR  2005   HEMORROIDECTOMY     JOINT REPLACEMENT Bilateral 11/2016   KNEE ARTHROPLASTY Left 11/14/2016   Procedure: COMPUTER ASSISTED TOTAL KNEE ARTHROPLASTY;  Surgeon: Donato Heinz, MD;  Location: [facility];  Service:  Orthopedics;  Laterality: Left;   KNEE ARTHROPLASTY Right 03/15/2017   Procedure: COMPUTER ASSISTED TOTAL KNEE ARTHROPLASTY;  Surgeon: Donato Heinz, MD;  Location: [facility];  Service: Orthopedics;  Laterality: Right;   KNEE ARTHROSCOPY Left    LIPOSUCTION     PELVIC FLOOR REPAIR     SHOULDER ARTHROSCOPY WITH DEBRIDEMENT AND BICEP TENDON REPAIR Left 09/24/2019   Procedure: Left shoulder arthroscopy with debridement, decompression, rotator cuff repair, and possible biceps tenodesis.;  Surgeon: Christena Flake, MD;  Location: [facility];  Service: Orthopedics;  Laterality: Left;   TOE SURGERY Right    TONSILLECTOMY     VARICOSE VEIN SURGERY     Patient Active Problem List   Diagnosis Date Noted   Diverticulitis large intestine 05/08/2022   S/P total knee arthroplasty 11/14/2016    PCP: Lynnea Ferrier, MD  REFERRING PROVIDER: Dayton Bailiff, PA  REFERRING DIAG: right groin pain, acute bilateral low back pain without sciatica, left hip pain, weakness of both legs  Rationale for Evaluation and Treatment: Rehabilitation  THERAPY DIAG:  Other low back pain  Other abnormalities of gait and mobility  Chronic pain of both knees  History of falling  Muscle weakness (generalized)  ONSET DATE: possibly chronic but current exacerbation started approximately January 2024  PERTINENT HISTORY:  Patient is a 82 y.o. female who presents to outpatient physical therapy with a referral for medical diagnosis right groin pain, acute bilateral low back pain without sciatica, left hip pain, weakness of both legs. This patient's chief complaints consist of difficulty with mobility, unsteadiness on her feet, falls, low back pain with prolonged standing, and anterior knee pain with kneeling, leading to the following functional deficits: difficulty with kneeling, getting up from the ground, not falling, caring for her husband, stairs, walking, getting up from sitting, bending, lifting, carrying,  gardening, cooking, prolonged standing. Relevant past medical history and comorbidities include plantar fasciitis, right foot pain (3 surgeries), osteopenia she has S/P total knee arthroplasty and Diverticulitis large intestine on their problem list. She  has a past medical history of Anemia, Anxiety, Arthritis, Cancer (HCC) (1999), GERD (gastroesophageal reflux disease), History of kidney stones, HOH (hard of hearing), Hypertension, Hypothyroidism, PONV (postoperative nausea and vomiting), and Tinnitus. She  has a past surgical history that includes Hemorroidectomy; Knee arthroscopy (Left); Liposuction; Blepharoplasty; Toe Surgery (Right); Varicose vein surgery; basil cell; Pelvic floor repair; Abdominal hysterectomy; Knee Arthroplasty (Left, 11/14/2016); Knee Arthroplasty (Right, 03/15/2017); total knee replacement (Bilateral, 11/2016); Cystocele repair (2005); Tonsillectomy; Shoulder arthroscopy with debridement and bicep tendon repair (Left, 09/24/2019); Cataract extraction w/PHACO (Left, 08/26/2020); and Cataract extraction w/PHACO (Right, 09/09/2020). Patient denies hx of stroke, seizures, lung problems, heart problems, diabetes, unexplained weight loss, unexplained changes in bowel or bladder problems, and spinal surgery  SUBJECTIVE:                                                                                                                                                                                           SUBJECTIVE STATEMENT: Patient states she is doing okay today. She states she had difficulty getting up when her hands were full with socks, but she was able to to it by rocking. She states she did exercises several days in a row last week on the deck. She felt soreness at her lower abs and was concerned it made her bladder drop a little more. She did not do much exercise since Sunday because she was busy. She went up/down several times on a step stool that has 3 steps on Monday, which is a big deal  for her. She states she thinks PT is helping her. She states she has noticed improvements in getting in and out of bed and the chair. She notes it is easier to get in/out of the car and swing her legs. She states she appreciates the accountability of doing exercises at the clinic because they are hard to get to at home, with all of  the tasks she has to do there. She states she is not good at taking questionnaires.   PAIN:  NPRS: 0/10  PRECAUTIONS: Fall  PATIENT GOALS: "stay on my feet" "be able to get up out of a chair" improve her core strength  OBJECTIVE  SELF-REPORTED FUNCTION FOTO score: 57/100 (balance questionnaire)  FUNCTIONAL/BALANCE TESTS 10 Meter Walk Test: 0.95 m/s  5 Time Sit To Stand Test: 13 seconds from 18.5 inch plinth with no UE support (3rd set able to perform without UE or LOB).    [facility] PT Assessment - 02/23/23 1328       Functional Gait  Assessment   Gait assessed  Yes    Gait Level Surface Walks 20 ft in less than 7 sec but greater than 5.5 sec, uses assistive device, slower speed, mild gait deviations, or deviates 6-10 in outside of the 12 in walkway width.    Change in Gait Speed Able to smoothly change walking speed without loss of balance or gait deviation. Deviate no more than 6 in outside of the 12 in walkway width.    Gait with Horizontal Head Turns Performs head turns smoothly with slight change in gait velocity (eg, minor disruption to smooth gait path), deviates 6-10 in outside 12 in walkway width, or uses an assistive device.    Gait with Vertical Head Turns Performs task with slight change in gait velocity (eg, minor disruption to smooth gait path), deviates 6 - 10 in outside 12 in walkway width or uses assistive device    Gait and Pivot Turn Pivot turns safely within 3 sec and stops quickly with no loss of balance.    Step Over Obstacle Is able to step over one shoe box (4.5 in total height) without changing gait speed. No evidence of imbalance.    Gait  with Narrow Base of Support Ambulates less than 4 steps heel to toe or cannot perform without assistance.    Gait with Eyes Closed Walks 20 ft, uses assistive device, slower speed, mild gait deviations, deviates 6-10 in outside 12 in walkway width. Ambulates 20 ft in less than 9 sec but greater than 7 sec.    Ambulating Backwards Walks 20 ft, uses assistive device, slower speed, mild gait deviations, deviates 6-10 in outside 12 in walkway width.    Steps Alternating feet, must use rail.    Total Score 20    FGA comment: 19-24 = medium risk fall           TODAY'S TREATMENT:     Neuromuscular Re-education: to improve, balance, postural strength, muscle activation patterns, and stabilization strength required for functional activities: - testing to assess progress (see above) - floor <> stand transfer with no chair for support, 1x1 with CGA and multiple tries.  - education on how to practice floor transfer at home safely.   Pt required multimodal cuing for proper technique and to facilitate improved neuromuscular control, strength, range of motion, and functional ability resulting in improved performance and form.  PATIENT EDUCATION:  Education details: Exercise purpose/form. Self management techniques. Education on diagnosis, prognosis, POC, anatomy and physiology of current condition. Education on HEP including handout. Person educated: Patient Education method: Explanation, Demonstration, Tactile cues, Verbal cues, and Handouts Education comprehension: verbalized understanding, returned demonstration, and needs further education  HOME EXERCISE PROGRAM: Access Code: [email or web address] URL: [email or web address] Date: 02/15/2023 Prepared by: Norton Blizzard  Exercises - Squat with Chair Touch  - 3-5 x weekly - 3 sets - 10 reps - Standing  March with Unilateral Counter Support  - 3-5 x weekly - 3 sets - 10 reps - Standing Knee Flexion with Unilateral Counter Support  - 3-5 x weekly -  3 sets - 10 reps - Standing Hip Abduction with Unilateral Counter Support  - 3-5 x weekly - 3 sets - 10 reps - Heel Toe Raises with Unilateral Counter Support  - 3-5 x weekly - 3 sets - 10 reps  HOME EXERCISE PROGRAM [[email or web address]] View at "[email or web address]" using code: [email or web address] Half Kneel on Padded Chair  -  Repeat 10 Repetitions, Hold 5 Seconds, Complete 2 Sets, Perform 1 Times a Day  HOME EXERCISE PROGRAM [[email or web address]] View at "[email or web address]" using code: [email or web address] DYNAMIC SINGLE LIMB STANCE - CONE KNOCK DOWN AND SET UP -  Repeat 10 Repetitions, Hold 1 Second(s), Complete 1 Set, Perform 1 Times a Day  ASSESSMENT:  CLINICAL IMPRESSION:  Patient has attended 20 physical therapy sessions since starting current episode of care on 11/30/2022. She has met her 5 Times Sit To Stand goal and perceived functional improvement goal. She has made progress towards FOTO (balance) goal and floor transfer test goal. She continues to work towards her long term HEP, but has not yet received the final plan since she has not completed PT. Patient continues to be highly motivated in the clinic and has a large physical responsibility at home where she has a high caregiver burden for her husband. Patient has not yet fulfilled her rehab potential and would benefit from further PT to improve fall risk, safety, functional ability, and quality of life. Patient would benefit from continued management of limiting condition by skilled physical therapist to address remaining impairments and functional limitations to work towards stated goals and return to PLOF or maximal functional independence.    OBJECTIVE IMPAIRMENTS: Abnormal gait, decreased activity tolerance, decreased balance, decreased endurance, decreased knowledge of condition, decreased knowledge of use of DME, decreased mobility, difficulty walking, decreased ROM, decreased strength, hypomobility, increased edema, impaired perceived functional ability, impaired  flexibility, obesity, and pain.   ACTIVITY LIMITATIONS: carrying, lifting, bending, standing, squatting, stairs, transfers, bed mobility, dressing, locomotion level, and caring for others  PARTICIPATION LIMITATIONS: meal prep, cleaning, laundry, interpersonal relationship, shopping, community activity, yard work, and   difficulty with kneeling, getting up from the ground, not falling, caring for her husband, stairs, walking, getting up from sitting, bending, lifting, carrying, gardening, cooking, prolonged standing  PERSONAL FACTORS: Age, Fitness, Past/current experiences, Time since onset of injury/illness/exacerbation, and 3+ comorbidities:   plantar fasciitis, right foot pain (3 surgeries), osteopenia she has S/P total knee arthroplasty and Diverticulitis large intestine on their problem list. She  has a past medical history of Anemia, Anxiety, Arthritis, Cancer (HCC) (1999), GERD (gastroesophageal reflux disease), History of kidney stones, HOH (hard of hearing), Hypertension, Hypothyroidism, PONV (postoperative nausea and vomiting), and Tinnitus. She  has a past surgical history that includes Hemorroidectomy; Knee arthroscopy (Left); Liposuction; Blepharoplasty; Toe Surgery (Right); Varicose vein surgery; basil cell; Pelvic floor repair; Abdominal hysterectomy; Knee Arthroplasty (Left, 11/14/2016); Knee Arthroplasty (Right, 03/15/2017); total knee replacement (Bilateral, 11/2016); Cystocele repair (2005); Tonsillectomy; Shoulder arthroscopy with debridement and bicep tendon repair (Left, 09/24/2019); Cataract extraction w/PHACO (Left, 08/26/2020); and Cataract extraction w/PHACO (Right, 09/09/2020) are also affecting patient's functional outcome.   REHAB POTENTIAL: Good  CLINICAL DECISION MAKING: Evolving/moderate complexity  EVALUATION COMPLEXITY: Moderate   GOALS: Goals reviewed with patient? No  SHORT TERM GOALS: Target date: 12/14/2022  Patient will be independent with initial home exercise  program for self-management  of symptoms. Baseline: Initial HEP provided at IE (11/30/22); Goal status: MET   LONG TERM GOALS: Target date: 02/22/2023. Target date updated to 05/18/2023  unmet goals on 02/23/2023.   Patient will be independent with a long-term home exercise program for self-management of symptoms.  Baseline: Initial HEP provided, longterm program still pending (01/11/23); Patient has been participating well (02/23/2023);  Goal status: In-progress  2.  Patient will demonstrate improved FOTO by equal or greater than 10 points by visit #10 to demonstrate improvement in overall condition and self-reported functional ability.  Baseline: 56 (11/30/22); 51 (pt denies feeling her balance has gotten worse, 01/11/23); 57 at visit #20 (02/23/2023);  Goal status: In-progress  3.  Patient will complete 5 Times Sit To Stand Test in equal or less than 15 seconds from 18.5 inch or less surface with no LOB or use of B UE to improve her ability to get around her home safely and carry items when getting up from a seated position.  Baseline: 23 seconds with frequent LOB backwards including leaning against plinth and one failed attempt. No use of B UE on the plinth. From 18.5 inch plinth (11/30/22); 17sec from 18.5" chair height hands free (01/11/23).  13 seconds from 18.5 inch plinth with no UE support (3rd set able to perform without UE or LOB. 02/23/2023);  Goal status: MET  4.  Patient will demonstrate the ability to complete the Floor Transfer Test in equal or less than 8.8 seconds to improve her ability to get up from the ground when gardening or after a fall.   Baseline: to be tested at a later date (11/30/22); unable to complete floor transfer test without two person assistance (12/13/2022); did not test, has not been a targeted intervention as of yet (01/11/23): 1:09 minutes/seconds with chair (02/23/2023);  Goal status: In-progress  5.  Patient will complete community, work and/or recreational  activities with 50% less limitation due to current condition.  Baseline: difficulty with kneeling, getting up from the ground, not falling, caring for her husband, stairs, walking, getting up from sitting, bending, lifting, carrying, gardening, cooking, prolonged standing (11/30/22); pt reports about the same, no big change here (01/11/23);  patient estimates 60% improvement (02/23/2023);  Goal status: MET  6.  Patient will score equal or greater than 25 on the Functional Gait Assessment to demonstrate low fall risk.  Baseline: to be tested at later date as appropriate (11/30/2022); FGA =19 (01/11/23); 20/30 (02/23/2023);  Goal status: In-progress   PLAN:  PT FREQUENCY: 1-2x/week  PT DURATION: 12 weeks  PLANNED INTERVENTIONS: Therapeutic exercises, Therapeutic activity, Neuromuscular re-education, Balance training, Gait training, Patient/Family education, Self Care, Joint mobilization, Stair training, DME instructions, Dry Needling, Cognitive remediation, Electrical stimulation, Spinal mobilization, Cryotherapy, Moist heat, Manual therapy, and Re-evaluation.  PLAN FOR NEXT SESSION: Update HEP as appropriate, progressive LE/core/functional strengthening and balance exercises. Education. Manual therapy as needed.    Cira Rue, PT,DPT 02/23/2023, 6:12 PM  [facility] [address] [place], [place] [address] P: [phone] I F: [phone]

## 2023-03-02 ENCOUNTER — Ambulatory Visit: Payer: HMO | Admitting: Physical Therapy

## 2023-03-02 DIAGNOSIS — Z9181 History of falling: Secondary | ICD-10-CM

## 2023-03-02 DIAGNOSIS — R2689 Other abnormalities of gait and mobility: Secondary | ICD-10-CM

## 2023-03-02 DIAGNOSIS — M5459 Other low back pain: Secondary | ICD-10-CM | POA: Diagnosis not present

## 2023-03-02 DIAGNOSIS — M6281 Muscle weakness (generalized): Secondary | ICD-10-CM

## 2023-03-02 DIAGNOSIS — G8929 Other chronic pain: Secondary | ICD-10-CM

## 2023-03-02 NOTE — Therapy (Signed)
OUTPATIENT PHYSICAL THERAPY TREATMENT    Patient Name: Mary Elliott MRN: [id number] DOB:[birth year], 82 y.o., female Today's Date: 03/02/2023  END OF SESSION:  PT End of Session - 03/02/23 1308     Visit Number 12    Number of Visits 40    Date for PT Re-Evaluation 05/18/23    Authorization Type [employer] reporting period from 02/23/2023    Progress Note Due on Visit 20    PT Start Time 1305    PT Stop Time 1343    PT Time Calculation (min) 38 min    Activity Tolerance Patient tolerated treatment well;No increased pain    Behavior During Therapy WFL for tasks assessed/performed                 Past Medical History:  Diagnosis Date   Anemia    Anxiety    Arthritis    fingers   Cancer (HCC) 1999   basil cell on face   GERD (gastroesophageal reflux disease)    history of   History of kidney stones    twice   HOH (hard of hearing)    Hypertension    Hypothyroidism    PONV (postoperative nausea and vomiting)    nausea only with 2 prior surgeries   Tinnitus    Past Surgical History:  Procedure Laterality Date   ABDOMINAL HYSTERECTOMY     basil cell     face   BLEPHAROPLASTY     CATARACT EXTRACTION W/PHACO Left 08/26/2020   Procedure: CATARACT EXTRACTION PHACO AND INTRAOCULAR LENS PLACEMENT (IOC) LEFT PANOPTIX TORIC;  Surgeon: Lockie Mola, MD;  Location: [facility];  Service: Ophthalmology;  Laterality: Left;  8.88 1.:27.2 10.1%   CATARACT EXTRACTION W/PHACO Right 09/09/2020   Procedure: CATARACT EXTRACTION PHACO AND INTRAOCULAR LENS PLACEMENT (IOC) RIGHT PANOPTIX TORIC 8.54 01:03.3 13.5%;  Surgeon: Lockie Mola, MD;  Location: [facility];  Service: Ophthalmology;  Laterality: Right;   CYSTOCELE REPAIR  2005   HEMORROIDECTOMY     JOINT REPLACEMENT Bilateral 11/2016   KNEE ARTHROPLASTY Left 11/14/2016   Procedure: COMPUTER ASSISTED TOTAL KNEE ARTHROPLASTY;  Surgeon: Donato Heinz, MD;  Location: [facility];   Service: Orthopedics;  Laterality: Left;   KNEE ARTHROPLASTY Right 03/15/2017   Procedure: COMPUTER ASSISTED TOTAL KNEE ARTHROPLASTY;  Surgeon: Donato Heinz, MD;  Location: [facility];  Service: Orthopedics;  Laterality: Right;   KNEE ARTHROSCOPY Left    LIPOSUCTION     PELVIC FLOOR REPAIR     SHOULDER ARTHROSCOPY WITH DEBRIDEMENT AND BICEP TENDON REPAIR Left 09/24/2019   Procedure: Left shoulder arthroscopy with debridement, decompression, rotator cuff repair, and possible biceps tenodesis.;  Surgeon: Christena Flake, MD;  Location: [facility];  Service: Orthopedics;  Laterality: Left;   TOE SURGERY Right    TONSILLECTOMY     VARICOSE VEIN SURGERY     Patient Active Problem List   Diagnosis Date Noted   Diverticulitis large intestine 05/08/2022   S/P total knee arthroplasty 11/14/2016    PCP: Lynnea Ferrier, MD  REFERRING PROVIDER: Dayton Bailiff, PA  REFERRING DIAG: right groin pain, acute bilateral low back pain without sciatica, left hip pain, weakness of both legs  Rationale for Evaluation and Treatment: Rehabilitation  THERAPY DIAG:  Other low back pain  Other abnormalities of gait and mobility  Chronic pain of both knees  History of falling  Muscle weakness (generalized)  ONSET DATE: possibly chronic but current exacerbation started approximately January 2024  PERTINENT  HISTORY:  Patient is a 82 y.o. female who presents to outpatient physical therapy with a referral for medical diagnosis right groin pain, acute bilateral low back pain without sciatica, left hip pain, weakness of both legs. This patient's chief complaints consist of difficulty with mobility, unsteadiness on her feet, falls, low back pain with prolonged standing, and anterior knee pain with kneeling, leading to the following functional deficits: difficulty with kneeling, getting up from the ground, not falling, caring for her husband, stairs, walking, getting up from sitting, bending, lifting, carrying,  gardening, cooking, prolonged standing. Relevant past medical history and comorbidities include plantar fasciitis, right foot pain (3 surgeries), osteopenia she has S/P total knee arthroplasty and Diverticulitis large intestine on their problem list. She  has a past medical history of Anemia, Anxiety, Arthritis, Cancer (HCC) (1999), GERD (gastroesophageal reflux disease), History of kidney stones, HOH (hard of hearing), Hypertension, Hypothyroidism, PONV (postoperative nausea and vomiting), and Tinnitus. She  has a past surgical history that includes Hemorroidectomy; Knee arthroscopy (Left); Liposuction; Blepharoplasty; Toe Surgery (Right); Varicose vein surgery; basil cell; Pelvic floor repair; Abdominal hysterectomy; Knee Arthroplasty (Left, 11/14/2016); Knee Arthroplasty (Right, 03/15/2017); total knee replacement (Bilateral, 11/2016); Cystocele repair (2005); Tonsillectomy; Shoulder arthroscopy with debridement and bicep tendon repair (Left, 09/24/2019); Cataract extraction w/PHACO (Left, 08/26/2020); and Cataract extraction w/PHACO (Right, 09/09/2020). Patient denies hx of stroke, seizures, lung problems, heart problems, diabetes, unexplained weight loss, unexplained changes in bowel or bladder problems, and spinal surgery  SUBJECTIVE:                                                                                                                                                                                           SUBJECTIVE STATEMENT: Patient states she is doing okay today. She states on Sunday she tried the [employer] at [employer]. She was able to stay there for a certain amount of time and she felt the thigh muscles in the front were tight. By the time she decided to sit back on the pew she decided that was enough. She denies falls.   PAIN:  NPRS: 0/10  PRECAUTIONS: Fall  PATIENT GOALS: "stay on my feet" "be able to get up out of a chair" improve her core strength  OBJECTIVE  TODAY'S TREATMENT:      Neuromuscular Re-education: to improve, balance, postural strength, muscle activation patterns, and stabilization strength required for functional activities: - standing ball toss with 2kg ball at rebounder, 1x10 double leg support to get bearings, 3x10 each side with step standing with one foot on half spike ball. CGA-minA and ball retrieval.   Therapeutic exercise: to centralize symptoms and improve ROM, strength,  muscular endurance, and activity tolerance required for successful completion of functional activities.  - standing SLS with alternating posterior and lateral toe tap (held foot off ground next to standing leg without touching it between directions), 3x10 each side (attempted two finger support on each hand but difficult for fingers to tolerate due to need for heavier support). Required mirroring PT for tempo and technique. Frequent stops that improved with cuing.    Therapeutic activities: for functional strengthening and improved functional activity tolerance. - floor <> stand transfer forwards to prone, then several attempts with 1-2 successful reps of prone to quadruped and quadruped knee lifts, CGA-min A. Discontinued due to right shoulder pain.   Pt required multimodal cuing for proper technique and to facilitate improved neuromuscular control, strength, range of motion, and functional ability resulting in improved performance and form.  PATIENT EDUCATION:  Education details: Exercise purpose/form. Self management techniques. Education on diagnosis, prognosis, POC, anatomy and physiology of current condition. Education on HEP including handout. Person educated: Patient Education method: Explanation, Demonstration, Tactile cues, Verbal cues, and Handouts Education comprehension: verbalized understanding, returned demonstration, and needs further education  HOME EXERCISE PROGRAM: Access Code: [email or web address] URL: [email or web address] Date: 02/15/2023 Prepared by: Norton Blizzard  Exercises - Squat with Chair Touch  - 3-5 x weekly - 3 sets - 10 reps - Standing March with Unilateral Counter Support  - 3-5 x weekly - 3 sets - 10 reps - Standing Knee Flexion with Unilateral Counter Support  - 3-5 x weekly - 3 sets - 10 reps - Standing Hip Abduction with Unilateral Counter Support  - 3-5 x weekly - 3 sets - 10 reps - Heel Toe Raises with Unilateral Counter Support  - 3-5 x weekly - 3 sets - 10 reps  HOME EXERCISE PROGRAM [[email or web address]] View at "[email or web address]" using code: [email or web address] Half Kneel on Padded Chair  -  Repeat 10 Repetitions, Hold 5 Seconds, Complete 2 Sets, Perform 1 Times a Day  HOME EXERCISE PROGRAM [[email or web address]] View at "[email or web address]" using code: [email or web address] DYNAMIC SINGLE LIMB STANCE - CONE KNOCK DOWN AND SET UP -  Repeat 10 Repetitions, Hold 1 Second(s), Complete 1 Set, Perform 1 Times a Day  Drawing for single leg toe taps, 3x10 each side 3 times a week  ASSESSMENT:  CLINICAL IMPRESSION:  Today's session focused on single leg balance and hip strengthening to improve balance and stability during functional activities. Also attempted to work on functional strengthening for improved floor transfers but patient was not quite strong enough to complete part or whole exercise without min A and due to right shoulder pain (it was discontinued for today to prevent shoulder irritation). Patient needed quite a bit of cuing for single leg toe tapping exercise, but seemed to get good lateral hip muscle fatigue that is appropriate for strengthening, so it was added to her HEP. She would likely benefit from reinforcement of form next session. Patient would benefit from continued management of limiting condition by skilled physical therapist to address remaining impairments and functional limitations to work towards stated goals and return to PLOF or maximal functional independence.    OBJECTIVE IMPAIRMENTS: Abnormal gait, decreased activity tolerance, decreased  balance, decreased endurance, decreased knowledge of condition, decreased knowledge of use of DME, decreased mobility, difficulty walking, decreased ROM, decreased strength, hypomobility, increased edema, impaired perceived functional ability, impaired flexibility, obesity, and pain.   ACTIVITY LIMITATIONS: carrying, lifting, bending, standing, squatting, stairs, transfers, bed mobility, dressing, locomotion level, and caring for others  PARTICIPATION LIMITATIONS: meal prep,  cleaning, laundry, interpersonal relationship, shopping, community activity, yard work, and   difficulty with kneeling, getting up from the ground, not falling, caring for her husband, stairs, walking, getting up from sitting, bending, lifting, carrying, gardening, cooking, prolonged standing  PERSONAL FACTORS: Age, Fitness, Past/current experiences, Time since onset of injury/illness/exacerbation, and 3+ comorbidities:   plantar fasciitis, right foot pain (3 surgeries), osteopenia she has S/P total knee arthroplasty and Diverticulitis large intestine on their problem list. She  has a past medical history of Anemia, Anxiety, Arthritis, Cancer (HCC) (1999), GERD (gastroesophageal reflux disease), History of kidney stones, HOH (hard of hearing), Hypertension, Hypothyroidism, PONV (postoperative nausea and vomiting), and Tinnitus. She  has a past surgical history that includes Hemorroidectomy; Knee arthroscopy (Left); Liposuction; Blepharoplasty; Toe Surgery (Right); Varicose vein surgery; basil cell; Pelvic floor repair; Abdominal hysterectomy; Knee Arthroplasty (Left, 11/14/2016); Knee Arthroplasty (Right, 03/15/2017); total knee replacement (Bilateral, 11/2016); Cystocele repair (2005); Tonsillectomy; Shoulder arthroscopy with debridement and bicep tendon repair (Left, 09/24/2019); Cataract extraction w/PHACO (Left, 08/26/2020); and Cataract extraction w/PHACO (Right, 09/09/2020) are also affecting patient's functional outcome.   REHAB  POTENTIAL: Good  CLINICAL DECISION MAKING: Evolving/moderate complexity  EVALUATION COMPLEXITY: Moderate   GOALS: Goals reviewed with patient? No  SHORT TERM GOALS: Target date: 12/14/2022  Patient will be independent with initial home exercise program for self-management of symptoms. Baseline: Initial HEP provided at IE (11/30/22); Goal status: MET   LONG TERM GOALS: Target date: 02/22/2023. Target date updated to 05/18/2023  unmet goals on 02/23/2023.   Patient will be independent with a long-term home exercise program for self-management of symptoms.  Baseline: Initial HEP provided, longterm program still pending (01/11/23); Patient has been participating well (02/23/2023);  Goal status: In-progress  2.  Patient will demonstrate improved FOTO by equal or greater than 10 points by visit #10 to demonstrate improvement in overall condition and self-reported functional ability.  Baseline: 56 (11/30/22); 51 (pt denies feeling her balance has gotten worse, 01/11/23); 57 at visit #20 (02/23/2023);  Goal status: In-progress  3.  Patient will complete 5 Times Sit To Stand Test in equal or less than 15 seconds from 18.5 inch or less surface with no LOB or use of B UE to improve her ability to get around her home safely and carry items when getting up from a seated position.  Baseline: 23 seconds with frequent LOB backwards including leaning against plinth and one failed attempt. No use of B UE on the plinth. From 18.5 inch plinth (11/30/22); 17sec from 18.5" chair height hands free (01/11/23).  13 seconds from 18.5 inch plinth with no UE support (3rd set able to perform without UE or LOB. 02/23/2023);  Goal status: MET  4.  Patient will demonstrate the ability to complete the Floor Transfer Test in equal or less than 8.8 seconds to improve her ability to get up from the ground when gardening or after a fall.   Baseline: to be tested at a later date (11/30/22); unable to complete floor transfer test  without two person assistance (12/13/2022); did not test, has not been a targeted intervention as of yet (01/11/23): 1:09 minutes/seconds with chair (02/23/2023);  Goal status: In-progress  5.  Patient will complete community, work and/or recreational activities with 50% less limitation due to current condition.  Baseline: difficulty with kneeling, getting up from the ground, not falling, caring for her husband, stairs, walking, getting up from sitting, bending, lifting, carrying, gardening, cooking, prolonged standing (11/30/22); pt reports about the same, no big change here (  01/11/23);  patient estimates 60% improvement (02/23/2023);  Goal status: MET  6.  Patient will score equal or greater than 25 on the Functional Gait Assessment to demonstrate low fall risk.  Baseline: to be tested at later date as appropriate (11/30/2022); FGA =19 (01/11/23); 20/30 (02/23/2023);  Goal status: In-progress   PLAN:  PT FREQUENCY: 1-2x/week  PT DURATION: 12 weeks  PLANNED INTERVENTIONS: Therapeutic exercises, Therapeutic activity, Neuromuscular re-education, Balance training, Gait training, Patient/Family education, Self Care, Joint mobilization, Stair training, DME instructions, Dry Needling, Cognitive remediation, Electrical stimulation, Spinal mobilization, Cryotherapy, Moist heat, Manual therapy, and Re-evaluation.  PLAN FOR NEXT SESSION: Update HEP as appropriate, progressive LE/core/functional strengthening and balance exercises. Education. Manual therapy as needed.    Cira Rue, PT,DPT 03/02/2023, 9:06 PM  [facility] [address] [place], [place] [address] P: [phone] I F: [phone]

## 2023-03-06 ENCOUNTER — Ambulatory Visit
Admission: RE | Admit: 2023-03-06 | Discharge: 2023-03-06 | Disposition: A | Discharge status: Home / Self Care | Payer: HMO | Source: Ambulatory Visit | Attending: Internal Medicine | Admitting: Internal Medicine

## 2023-03-06 DIAGNOSIS — Z1231 Encounter for screening mammogram for malignant neoplasm of breast: Secondary | ICD-10-CM | POA: Insufficient documentation

## 2023-03-09 ENCOUNTER — Ambulatory Visit: Payer: HMO | Admitting: Physical Therapy

## 2023-03-09 DIAGNOSIS — Z9181 History of falling: Secondary | ICD-10-CM

## 2023-03-09 DIAGNOSIS — H903 Sensorineural hearing loss, bilateral: Secondary | ICD-10-CM | POA: Diagnosis not present

## 2023-03-09 DIAGNOSIS — M6281 Muscle weakness (generalized): Secondary | ICD-10-CM

## 2023-03-09 DIAGNOSIS — R2689 Other abnormalities of gait and mobility: Secondary | ICD-10-CM

## 2023-03-09 DIAGNOSIS — M5459 Other low back pain: Secondary | ICD-10-CM

## 2023-03-09 DIAGNOSIS — G8929 Other chronic pain: Secondary | ICD-10-CM

## 2023-03-09 NOTE — Therapy (Signed)
OUTPATIENT PHYSICAL THERAPY TREATMENT    Patient Name: Mary Elliott MRN: [id number] DOB:[birth year], 82 y.o., female Today's Date: 03/09/2023  END OF SESSION:  PT End of Session - 03/09/23 1310     Visit Number 13    Number of Visits 40    Date for PT Re-Evaluation 05/18/23    Authorization Type [employer] reporting period from 02/23/2023    Progress Note Due on Visit 20    PT Start Time 1305    PT Stop Time 1340    PT Time Calculation (min) 35 min    Activity Tolerance Patient tolerated treatment well;No increased pain    Behavior During Therapy WFL for tasks assessed/performed                  Past Medical History:  Diagnosis Date   Anemia    Anxiety    Arthritis    fingers   Cancer (HCC) 1999   basil cell on face   GERD (gastroesophageal reflux disease)    history of   History of kidney stones    twice   HOH (hard of hearing)    Hypertension    Hypothyroidism    PONV (postoperative nausea and vomiting)    nausea only with 2 prior surgeries   Tinnitus    Past Surgical History:  Procedure Laterality Date   ABDOMINAL HYSTERECTOMY     basil cell     face   BLEPHAROPLASTY     CATARACT EXTRACTION W/PHACO Left 08/26/2020   Procedure: CATARACT EXTRACTION PHACO AND INTRAOCULAR LENS PLACEMENT (IOC) LEFT PANOPTIX TORIC;  Surgeon: Lockie Mola, MD;  Location: [facility];  Service: Ophthalmology;  Laterality: Left;  8.88 1.:27.2 10.1%   CATARACT EXTRACTION W/PHACO Right 09/09/2020   Procedure: CATARACT EXTRACTION PHACO AND INTRAOCULAR LENS PLACEMENT (IOC) RIGHT PANOPTIX TORIC 8.54 01:03.3 13.5%;  Surgeon: Lockie Mola, MD;  Location: [facility];  Service: Ophthalmology;  Laterality: Right;   CYSTOCELE REPAIR  2005   HEMORROIDECTOMY     JOINT REPLACEMENT Bilateral 11/2016   KNEE ARTHROPLASTY Left 11/14/2016   Procedure: COMPUTER ASSISTED TOTAL KNEE ARTHROPLASTY;  Surgeon: Donato Heinz, MD;  Location: [facility];   Service: Orthopedics;  Laterality: Left;   KNEE ARTHROPLASTY Right 03/15/2017   Procedure: COMPUTER ASSISTED TOTAL KNEE ARTHROPLASTY;  Surgeon: Donato Heinz, MD;  Location: [facility];  Service: Orthopedics;  Laterality: Right;   KNEE ARTHROSCOPY Left    LIPOSUCTION     PELVIC FLOOR REPAIR     SHOULDER ARTHROSCOPY WITH DEBRIDEMENT AND BICEP TENDON REPAIR Left 09/24/2019   Procedure: Left shoulder arthroscopy with debridement, decompression, rotator cuff repair, and possible biceps tenodesis.;  Surgeon: Christena Flake, MD;  Location: [facility];  Service: Orthopedics;  Laterality: Left;   TOE SURGERY Right    TONSILLECTOMY     VARICOSE VEIN SURGERY     Patient Active Problem List   Diagnosis Date Noted   Diverticulitis large intestine 05/08/2022   S/P total knee arthroplasty 11/14/2016    PCP: Lynnea Ferrier, MD  REFERRING PROVIDER: Dayton Bailiff, PA  REFERRING DIAG: right groin pain, acute bilateral low back pain without sciatica, left hip pain, weakness of both legs  Rationale for Evaluation and Treatment: Rehabilitation  THERAPY DIAG:  Other low back pain  Other abnormalities of gait and mobility  Chronic pain of both knees  History of falling  Muscle weakness (generalized)  ONSET DATE: possibly chronic but current exacerbation started approximately January 2024  PERTINENT HISTORY:  Patient is a 82 y.o. female who presents to outpatient physical therapy with a referral for medical diagnosis right groin pain, acute bilateral low back pain without sciatica, left hip pain, weakness of both legs. This patient's chief complaints consist of difficulty with mobility, unsteadiness on her feet, falls, low back pain with prolonged standing, and anterior knee pain with kneeling, leading to the following functional deficits: difficulty with kneeling, getting up from the ground, not falling, caring for her husband, stairs, walking, getting up from sitting, bending, lifting, carrying,  gardening, cooking, prolonged standing. Relevant past medical history and comorbidities include plantar fasciitis, right foot pain (3 surgeries), osteopenia she has S/P total knee arthroplasty and Diverticulitis large intestine on their problem list. She  has a past medical history of Anemia, Anxiety, Arthritis, Cancer (HCC) (1999), GERD (gastroesophageal reflux disease), History of kidney stones, HOH (hard of hearing), Hypertension, Hypothyroidism, PONV (postoperative nausea and vomiting), and Tinnitus. She  has a past surgical history that includes Hemorroidectomy; Knee arthroscopy (Left); Liposuction; Blepharoplasty; Toe Surgery (Right); Varicose vein surgery; basil cell; Pelvic floor repair; Abdominal hysterectomy; Knee Arthroplasty (Left, 11/14/2016); Knee Arthroplasty (Right, 03/15/2017); total knee replacement (Bilateral, 11/2016); Cystocele repair (2005); Tonsillectomy; Shoulder arthroscopy with debridement and bicep tendon repair (Left, 09/24/2019); Cataract extraction w/PHACO (Left, 08/26/2020); and Cataract extraction w/PHACO (Right, 09/09/2020). Patient denies hx of stroke, seizures, lung problems, heart problems, diabetes, unexplained weight loss, unexplained changes in bowel or bladder problems, and spinal surgery  SUBJECTIVE:                                                                                                                                                                                           SUBJECTIVE STATEMENT: Patient states her right arm was hurting her after last PT session and she does not want to do the same exercise that caused that (too many attempts at prone to stand).   PAIN:  NPRS: 2-3/10 right lateral upper arm  PRECAUTIONS: Fall  PATIENT GOALS: "stay on my feet" "be able to get up out of a chair" improve her core strength  OBJECTIVE  TODAY'S TREATMENT:    Therapeutic exercise: to centralize symptoms and improve ROM, strength, muscular endurance, and activity  tolerance required for successful completion of functional activities.  - standing SLS with alternating posterior and lateral toe tap (held foot off ground next to standing leg without touching it between directions), 3x10 each side (attempted two finger support on each hand but difficult for fingers to tolerate due to need for heavier support). Required mirroring PT for tempo and technique. Frequent stops that improved with cuing.    Neuromuscular  Re-education: to improve, balance, postural strength, muscle activation patterns, and stabilization strength required for functional activities: - step/stick to SLS with U UE support, 1x10 each side. Cuing to learn technique and SBA for safety.  - step/stick to modified SLS with back toe resting on ground without UE support, CGA and occasional UE support at nearby TM bar, 2x10 each side. Cuing to learn technique and to decrease foot support.  - education on PT ability to assist with BPPV screen next session.   Pt required multimodal cuing for proper technique and to facilitate improved neuromuscular control, strength, range of motion, and functional ability resulting in improved performance and form.  PATIENT EDUCATION:  Education details: Exercise purpose/form. Self management techniques. Education on diagnosis, prognosis, POC, anatomy and physiology of current condition. Education on HEP including handout. Person educated: Patient Education method: Explanation, Demonstration, Tactile cues, Verbal cues, and Handouts Education comprehension: verbalized understanding, returned demonstration, and needs further education  HOME EXERCISE PROGRAM: Access Code: [email or web address] URL: [email or web address] Date: 02/15/2023 Prepared by: Norton Blizzard  Exercises - Squat with Chair Touch  - 3-5 x weekly - 3 sets - 10 reps - Standing March with Unilateral Counter Support  - 3-5 x weekly - 3 sets - 10 reps - Standing Knee Flexion with Unilateral Counter  Support  - 3-5 x weekly - 3 sets - 10 reps - Standing Hip Abduction with Unilateral Counter Support  - 3-5 x weekly - 3 sets - 10 reps - Heel Toe Raises with Unilateral Counter Support  - 3-5 x weekly - 3 sets - 10 reps  HOME EXERCISE PROGRAM [[email or web address]] View at "[email or web address]" using code: [email or web address] Half Kneel on Padded Chair  -  Repeat 10 Repetitions, Hold 5 Seconds, Complete 2 Sets, Perform 1 Times a Day  HOME EXERCISE PROGRAM [[email or web address]] View at "[email or web address]" using code: [email or web address] DYNAMIC SINGLE LIMB STANCE - CONE KNOCK DOWN AND SET UP -  Repeat 10 Repetitions, Hold 1 Second(s), Complete 1 Set, Perform 1 Times a Day  Drawing for single leg toe taps, 3x10 each side 3 times a week  ASSESSMENT:  CLINICAL IMPRESSION:  Patient with exacerbation of shoulder pain following last PT session, likely due to heavy work performed with the B UE during floor transfer exercise last session. Avoided exercises that caused pain to the shoulder today and continued to focus on hip strength and balance. Patient reported appropriate fatigue around the hips by end of session. She reported her doctor said she shoulder get help for BPPV at her earliest convince due to nightly spinning sensation when she lays down and history of having this problem improved with the Eply maneuver. Patient educated that PT could screen her and possibly perform the eply if appropriate next week, so an additional session was scheduled to do this. Improving vertigo with decrease patient's fall risk and allow her to continue with her rehab for her primary condition with improved safety. Patient would benefit from continued management of limiting condition by skilled physical therapist to address remaining impairments and functional limitations to work towards stated goals and return to PLOF or maximal functional independence.     OBJECTIVE IMPAIRMENTS: Abnormal gait, decreased activity tolerance, decreased balance, decreased  endurance, decreased knowledge of condition, decreased knowledge of use of DME, decreased mobility, difficulty walking, decreased ROM, decreased strength, hypomobility, increased edema, impaired perceived functional ability, impaired flexibility, obesity, and pain.   ACTIVITY LIMITATIONS: carrying, lifting, bending, standing, squatting, stairs, transfers, bed mobility, dressing, locomotion level, and caring for others  PARTICIPATION  LIMITATIONS: meal prep, cleaning, laundry, interpersonal relationship, shopping, community activity, yard work, and   difficulty with kneeling, getting up from the ground, not falling, caring for her husband, stairs, walking, getting up from sitting, bending, lifting, carrying, gardening, cooking, prolonged standing  PERSONAL FACTORS: Age, Fitness, Past/current experiences, Time since onset of injury/illness/exacerbation, and 3+ comorbidities:   plantar fasciitis, right foot pain (3 surgeries), osteopenia she has S/P total knee arthroplasty and Diverticulitis large intestine on their problem list. She  has a past medical history of Anemia, Anxiety, Arthritis, Cancer (HCC) (1999), GERD (gastroesophageal reflux disease), History of kidney stones, HOH (hard of hearing), Hypertension, Hypothyroidism, PONV (postoperative nausea and vomiting), and Tinnitus. She  has a past surgical history that includes Hemorroidectomy; Knee arthroscopy (Left); Liposuction; Blepharoplasty; Toe Surgery (Right); Varicose vein surgery; basil cell; Pelvic floor repair; Abdominal hysterectomy; Knee Arthroplasty (Left, 11/14/2016); Knee Arthroplasty (Right, 03/15/2017); total knee replacement (Bilateral, 11/2016); Cystocele repair (2005); Tonsillectomy; Shoulder arthroscopy with debridement and bicep tendon repair (Left, 09/24/2019); Cataract extraction w/PHACO (Left, 08/26/2020); and Cataract extraction w/PHACO (Right, 09/09/2020) are also affecting patient's functional outcome.   REHAB POTENTIAL: Good  CLINICAL  DECISION MAKING: Evolving/moderate complexity  EVALUATION COMPLEXITY: Moderate   GOALS: Goals reviewed with patient? No  SHORT TERM GOALS: Target date: 12/14/2022  Patient will be independent with initial home exercise program for self-management of symptoms. Baseline: Initial HEP provided at IE (11/30/22); Goal status: MET   LONG TERM GOALS: Target date: 02/22/2023. Target date updated to 05/18/2023  unmet goals on 02/23/2023.   Patient will be independent with a long-term home exercise program for self-management of symptoms.  Baseline: Initial HEP provided, longterm program still pending (01/11/23); Patient has been participating well (02/23/2023);  Goal status: In-progress  2.  Patient will demonstrate improved FOTO by equal or greater than 10 points by visit #10 to demonstrate improvement in overall condition and self-reported functional ability.  Baseline: 56 (11/30/22); 51 (pt denies feeling her balance has gotten worse, 01/11/23); 57 at visit #20 (02/23/2023);  Goal status: In-progress  3.  Patient will complete 5 Times Sit To Stand Test in equal or less than 15 seconds from 18.5 inch or less surface with no LOB or use of B UE to improve her ability to get around her home safely and carry items when getting up from a seated position.  Baseline: 23 seconds with frequent LOB backwards including leaning against plinth and one failed attempt. No use of B UE on the plinth. From 18.5 inch plinth (11/30/22); 17sec from 18.5" chair height hands free (01/11/23).  13 seconds from 18.5 inch plinth with no UE support (3rd set able to perform without UE or LOB. 02/23/2023);  Goal status: MET  4.  Patient will demonstrate the ability to complete the Floor Transfer Test in equal or less than 8.8 seconds to improve her ability to get up from the ground when gardening or after a fall.   Baseline: to be tested at a later date (11/30/22); unable to complete floor transfer test without two person assistance  (12/13/2022); did not test, has not been a targeted intervention as of yet (01/11/23): 1:09 minutes/seconds with chair (02/23/2023);  Goal status: In-progress  5.  Patient will complete community, work and/or recreational activities with 50% less limitation due to current condition.  Baseline: difficulty with kneeling, getting up from the ground, not falling, caring for her husband, stairs, walking, getting up from sitting, bending, lifting, carrying, gardening, cooking, prolonged standing (11/30/22); pt reports about the same, no  big change here (01/11/23);  patient estimates 60% improvement (02/23/2023);  Goal status: MET  6.  Patient will score equal or greater than 25 on the Functional Gait Assessment to demonstrate low fall risk.  Baseline: to be tested at later date as appropriate (11/30/2022); FGA =19 (01/11/23); 20/30 (02/23/2023);  Goal status: In-progress   PLAN:  PT FREQUENCY: 1-2x/week  PT DURATION: 12 weeks  PLANNED INTERVENTIONS: Therapeutic exercises, Therapeutic activity, Neuromuscular re-education, Balance training, Gait training, Patient/Family education, Self Care, Joint mobilization, Stair training, DME instructions, Dry Needling, Cognitive remediation, Electrical stimulation, Spinal mobilization, Cryotherapy, Moist heat, Manual therapy, and Re-evaluation.  PLAN FOR NEXT SESSION: Update HEP as appropriate, progressive LE/core/functional strengthening and balance exercises. Education. Manual therapy as needed.   BPPV screen and treat as appropriate   Cira Rue, PT,DPT 03/09/2023, 2:20 PM  [facility] [address] [place], [place] [address] P: [phone] I F: [phone]

## 2023-03-14 ENCOUNTER — Ambulatory Visit: Payer: HMO | Attending: Physician Assistant | Admitting: Physical Therapy

## 2023-03-14 ENCOUNTER — Encounter: Payer: Self-pay | Admitting: Physical Therapy

## 2023-03-14 DIAGNOSIS — M25561 Pain in right knee: Secondary | ICD-10-CM | POA: Insufficient documentation

## 2023-03-14 DIAGNOSIS — M5459 Other low back pain: Secondary | ICD-10-CM | POA: Insufficient documentation

## 2023-03-14 DIAGNOSIS — Z9181 History of falling: Secondary | ICD-10-CM | POA: Insufficient documentation

## 2023-03-14 DIAGNOSIS — M6281 Muscle weakness (generalized): Secondary | ICD-10-CM | POA: Insufficient documentation

## 2023-03-14 DIAGNOSIS — L309 Dermatitis, unspecified: Secondary | ICD-10-CM | POA: Diagnosis not present

## 2023-03-14 DIAGNOSIS — G8929 Other chronic pain: Secondary | ICD-10-CM | POA: Insufficient documentation

## 2023-03-14 DIAGNOSIS — L218 Other seborrheic dermatitis: Secondary | ICD-10-CM | POA: Diagnosis not present

## 2023-03-14 DIAGNOSIS — R2689 Other abnormalities of gait and mobility: Secondary | ICD-10-CM | POA: Insufficient documentation

## 2023-03-14 DIAGNOSIS — M25562 Pain in left knee: Secondary | ICD-10-CM | POA: Diagnosis not present

## 2023-03-14 DIAGNOSIS — L03011 Cellulitis of right finger: Secondary | ICD-10-CM | POA: Diagnosis not present

## 2023-03-14 NOTE — Therapy (Signed)
OUTPATIENT PHYSICAL THERAPY TREATMENT    Patient Name: Mary Elliott MRN: [id number] DOB:[birth year], 82 y.o., female Today's Date: 03/14/2023  END OF SESSION:  PT End of Session - 03/14/23 1439     Visit Number 14    Number of Visits 40    Date for PT Re-Evaluation 05/18/23    Authorization Type [employer] reporting period from 02/23/2023    Progress Note Due on Visit 20    PT Start Time 1436    PT Stop Time 1515    PT Time Calculation (min) 39 min    Activity Tolerance Patient tolerated treatment well;No increased pain    Behavior During Therapy WFL for tasks assessed/performed               Past Medical History:  Diagnosis Date   Anemia    Anxiety    Arthritis    fingers   Cancer (HCC) 1999   basil cell on face   GERD (gastroesophageal reflux disease)    history of   History of kidney stones    twice   HOH (hard of hearing)    Hypertension    Hypothyroidism    PONV (postoperative nausea and vomiting)    nausea only with 2 prior surgeries   Tinnitus    Past Surgical History:  Procedure Laterality Date   ABDOMINAL HYSTERECTOMY     basil cell     face   BLEPHAROPLASTY     CATARACT EXTRACTION W/PHACO Left 08/26/2020   Procedure: CATARACT EXTRACTION PHACO AND INTRAOCULAR LENS PLACEMENT (IOC) LEFT PANOPTIX TORIC;  Surgeon: Lockie Mola, MD;  Location: [facility];  Service: Ophthalmology;  Laterality: Left;  8.88 1.:27.2 10.1%   CATARACT EXTRACTION W/PHACO Right 09/09/2020   Procedure: CATARACT EXTRACTION PHACO AND INTRAOCULAR LENS PLACEMENT (IOC) RIGHT PANOPTIX TORIC 8.54 01:03.3 13.5%;  Surgeon: Lockie Mola, MD;  Location: [facility];  Service: Ophthalmology;  Laterality: Right;   CYSTOCELE REPAIR  2005   HEMORROIDECTOMY     JOINT REPLACEMENT Bilateral 11/2016   KNEE ARTHROPLASTY Left 11/14/2016   Procedure: COMPUTER ASSISTED TOTAL KNEE ARTHROPLASTY;  Surgeon: Donato Heinz, MD;  Location: [facility];  Service:  Orthopedics;  Laterality: Left;   KNEE ARTHROPLASTY Right 03/15/2017   Procedure: COMPUTER ASSISTED TOTAL KNEE ARTHROPLASTY;  Surgeon: Donato Heinz, MD;  Location: [facility];  Service: Orthopedics;  Laterality: Right;   KNEE ARTHROSCOPY Left    LIPOSUCTION     PELVIC FLOOR REPAIR     SHOULDER ARTHROSCOPY WITH DEBRIDEMENT AND BICEP TENDON REPAIR Left 09/24/2019   Procedure: Left shoulder arthroscopy with debridement, decompression, rotator cuff repair, and possible biceps tenodesis.;  Surgeon: Christena Flake, MD;  Location: [facility];  Service: Orthopedics;  Laterality: Left;   TOE SURGERY Right    TONSILLECTOMY     VARICOSE VEIN SURGERY     Patient Active Problem List   Diagnosis Date Noted   Diverticulitis large intestine 05/08/2022   S/P total knee arthroplasty 11/14/2016    PCP: Lynnea Ferrier, MD  REFERRING PROVIDER: Dayton Bailiff, PA  REFERRING DIAG: right groin pain, acute bilateral low back pain without sciatica, left hip pain, weakness of both legs  Rationale for Evaluation and Treatment: Rehabilitation  THERAPY DIAG:  Other low back pain  Other abnormalities of gait and mobility  Chronic pain of both knees  History of falling  Muscle weakness (generalized)  ONSET DATE: possibly chronic but current exacerbation started approximately January 2024  PERTINENT HISTORY:  Patient is a 82 y.o. female who presents to outpatient physical therapy with a referral for medical diagnosis right groin pain, acute bilateral low back pain without sciatica, left hip pain, weakness of both legs. This patient's chief complaints consist of difficulty with mobility, unsteadiness on her feet, falls, low back pain with prolonged standing, and anterior knee pain with kneeling, leading to the following functional deficits: difficulty with kneeling, getting up from the ground, not falling, caring for her husband, stairs, walking, getting up from sitting, bending, lifting, carrying,  gardening, cooking, prolonged standing. Relevant past medical history and comorbidities include plantar fasciitis, right foot pain (3 surgeries), osteopenia she has S/P total knee arthroplasty and Diverticulitis large intestine on their problem list. She  has a past medical history of Anemia, Anxiety, Arthritis, Cancer (HCC) (1999), GERD (gastroesophageal reflux disease), History of kidney stones, HOH (hard of hearing), Hypertension, Hypothyroidism, PONV (postoperative nausea and vomiting), and Tinnitus. She  has a past surgical history that includes Hemorroidectomy; Knee arthroscopy (Left); Liposuction; Blepharoplasty; Toe Surgery (Right); Varicose vein surgery; basil cell; Pelvic floor repair; Abdominal hysterectomy; Knee Arthroplasty (Left, 11/14/2016); Knee Arthroplasty (Right, 03/15/2017); total knee replacement (Bilateral, 11/2016); Cystocele repair (2005); Tonsillectomy; Shoulder arthroscopy with debridement and bicep tendon repair (Left, 09/24/2019); Cataract extraction w/PHACO (Left, 08/26/2020); and Cataract extraction w/PHACO (Right, 09/09/2020). Patient denies hx of stroke, seizures, lung problems, heart problems, diabetes, unexplained weight loss, unexplained changes in bowel or bladder problems, and spinal surgery  SUBJECTIVE:                                                                                                                                                                                           SUBJECTIVE STATEMENT: Patient states she has states she has been having a spinning sensation on and off when she lays down at night. She has a history of BPPV and her audiologist recently suggested she get help for it with Eply maneuver as soon as possible. She states the spinning she experiences is not as rapid as it has been before.   PAIN:  NPRS: denies  PRECAUTIONS: Fall  PATIENT GOALS: "stay on my feet" "be able to get up out of a chair" improve her core  strength  OBJECTIVE  Dix-Hallpike Left posterior canal: negative Right posterior canal: positive for nystagmus that subsided within 30 seconds (no concordant vertigo) before treatment, negative after treatment  TODAY'S TREATMENT:    Therapeutic exercise: to centralize symptoms and improve ROM, strength, muscular endurance, and activity tolerance required for successful completion of functional activities.  - standing SLS with alternating posterior and lateral toe tap (held foot off ground next to standing  leg without touching it between directions), 3x10 each side (attempted two finger support on each hand but difficult for fingers to tolerate due to need for heavier support). Required mirroring PT for tempo and technique. Frequent stops that improved with cuing.    Neuromuscular Re-education: to improve, balance, postural strength, muscle activation patterns, and stabilization strength required for functional activities: - Dix-hallpike testing - Eply maneuver for right posterior canal, 2 times through with 1 min hold in all positions except 2nd to last position in 2nd time through (due to left hip/leg discomfort).   - step/stick to modified SLS with back toe resting on ground without UE support, CGA and occasional UE support at nearby TM bar, 2x10 each side. Cuing to learn remember technique and to decrease foot support.   Pt required multimodal cuing for proper technique and to facilitate improved neuromuscular control, strength, range of motion, and functional ability resulting in improved performance and form.  PATIENT EDUCATION:  Education details: Exercise purpose/form. Self management techniques. Education on diagnosis, prognosis, POC, anatomy and physiology of current condition. Education on HEP including handout. Person educated: Patient Education method: Explanation, Demonstration, Tactile cues, Verbal cues, and Handouts Education comprehension: verbalized understanding, returned  demonstration, and needs further education  HOME EXERCISE PROGRAM: Access Code: [email or web address] URL: [email or web address] Date: 02/15/2023 Prepared by: Norton Blizzard  Exercises - Squat with Chair Touch  - 3-5 x weekly - 3 sets - 10 reps - Standing March with Unilateral Counter Support  - 3-5 x weekly - 3 sets - 10 reps - Standing Knee Flexion with Unilateral Counter Support  - 3-5 x weekly - 3 sets - 10 reps - Standing Hip Abduction with Unilateral Counter Support  - 3-5 x weekly - 3 sets - 10 reps - Heel Toe Raises with Unilateral Counter Support  - 3-5 x weekly - 3 sets - 10 reps  HOME EXERCISE PROGRAM [[email or web address]] View at "[email or web address]" using code: [email or web address] Half Kneel on Padded Chair  -  Repeat 10 Repetitions, Hold 5 Seconds, Complete 2 Sets, Perform 1 Times a Day  HOME EXERCISE PROGRAM [[email or web address]] View at "[email or web address]" using code: [email or web address] DYNAMIC SINGLE LIMB STANCE - CONE KNOCK DOWN AND SET UP -  Repeat 10 Repetitions, Hold 1 Second(s), Complete 1 Set, Perform 1 Times a Day  Drawing for single leg toe taps, 3x10 each side 3 times a week  ASSESSMENT:  CLINICAL IMPRESSION:  Patient arrives to additional visit this week specifically to screen for and address BPPV if present to help decrease fall risk and allow her to participate in her other exercises more safely and effectively. Patient tolerated session well overall. She was positive for nystagmus (left torsion?) with onset/resolution within the first 30 seconds in the Dix-Hallpike for left posterior canal. She was subsequently treated with Eply maneuver with negative Dix-Hallpike following. The remainder of the session was spent continuing generalized balance exercise focused on stepping and modified SLS. She is likely ready to progress this exercise, since it did not feel as difficult and she was able to maintain balance better than at last PT session. Plan to return to strengthening and balance exercises next  session if no further BPPV symptoms reported. Patient would benefit from continued management of limiting condition by skilled physical therapist to address remaining impairments and functional limitations to work towards stated goals and return to PLOF or maximal functional independence.    OBJECTIVE IMPAIRMENTS: Abnormal gait, decreased activity tolerance, decreased balance, decreased endurance, decreased knowledge of condition, decreased knowledge of use of  DME, decreased mobility, difficulty walking, decreased ROM, decreased strength, hypomobility, increased edema, impaired perceived functional ability, impaired flexibility, obesity, and pain.   ACTIVITY LIMITATIONS: carrying, lifting, bending, standing, squatting, stairs, transfers, bed mobility, dressing, locomotion level, and caring for others  PARTICIPATION LIMITATIONS: meal prep, cleaning, laundry, interpersonal relationship, shopping, community activity, yard work, and   difficulty with kneeling, getting up from the ground, not falling, caring for her husband, stairs, walking, getting up from sitting, bending, lifting, carrying, gardening, cooking, prolonged standing  PERSONAL FACTORS: Age, Fitness, Past/current experiences, Time since onset of injury/illness/exacerbation, and 3+ comorbidities:   plantar fasciitis, right foot pain (3 surgeries), osteopenia she has S/P total knee arthroplasty and Diverticulitis large intestine on their problem list. She  has a past medical history of Anemia, Anxiety, Arthritis, Cancer (HCC) (1999), GERD (gastroesophageal reflux disease), History of kidney stones, HOH (hard of hearing), Hypertension, Hypothyroidism, PONV (postoperative nausea and vomiting), and Tinnitus. She  has a past surgical history that includes Hemorroidectomy; Knee arthroscopy (Left); Liposuction; Blepharoplasty; Toe Surgery (Right); Varicose vein surgery; basil cell; Pelvic floor repair; Abdominal hysterectomy; Knee Arthroplasty (Left,  11/14/2016); Knee Arthroplasty (Right, 03/15/2017); total knee replacement (Bilateral, 11/2016); Cystocele repair (2005); Tonsillectomy; Shoulder arthroscopy with debridement and bicep tendon repair (Left, 09/24/2019); Cataract extraction w/PHACO (Left, 08/26/2020); and Cataract extraction w/PHACO (Right, 09/09/2020) are also affecting patient's functional outcome.   REHAB POTENTIAL: Good  CLINICAL DECISION MAKING: Evolving/moderate complexity  EVALUATION COMPLEXITY: Moderate   GOALS: Goals reviewed with patient? No  SHORT TERM GOALS: Target date: 12/14/2022  Patient will be independent with initial home exercise program for self-management of symptoms. Baseline: Initial HEP provided at IE (11/30/22); Goal status: MET   LONG TERM GOALS: Target date: 02/22/2023. Target date updated to 05/18/2023  unmet goals on 02/23/2023.   Patient will be independent with a long-term home exercise program for self-management of symptoms.  Baseline: Initial HEP provided, longterm program still pending (01/11/23); Patient has been participating well (02/23/2023);  Goal status: In-progress  2.  Patient will demonstrate improved FOTO by equal or greater than 10 points by visit #10 to demonstrate improvement in overall condition and self-reported functional ability.  Baseline: 56 (11/30/22); 51 (pt denies feeling her balance has gotten worse, 01/11/23); 57 at visit #20 (02/23/2023);  Goal status: In-progress  3.  Patient will complete 5 Times Sit To Stand Test in equal or less than 15 seconds from 18.5 inch or less surface with no LOB or use of B UE to improve her ability to get around her home safely and carry items when getting up from a seated position.  Baseline: 23 seconds with frequent LOB backwards including leaning against plinth and one failed attempt. No use of B UE on the plinth. From 18.5 inch plinth (11/30/22); 17sec from 18.5" chair height hands free (01/11/23).  13 seconds from 18.5 inch plinth with no UE  support (3rd set able to perform without UE or LOB. 02/23/2023);  Goal status: MET  4.  Patient will demonstrate the ability to complete the Floor Transfer Test in equal or less than 8.8 seconds to improve her ability to get up from the ground when gardening or after a fall.   Baseline: to be tested at a later date (11/30/22); unable to complete floor transfer test without two person assistance (12/13/2022); did not test, has not been a targeted intervention as of yet (01/11/23): 1:09 minutes/seconds with chair (02/23/2023);  Goal status: In-progress  5.  Patient will complete community, work and/or recreational activities with  50% less limitation due to current condition.  Baseline: difficulty with kneeling, getting up from the ground, not falling, caring for her husband, stairs, walking, getting up from sitting, bending, lifting, carrying, gardening, cooking, prolonged standing (11/30/22); pt reports about the same, no big change here (01/11/23);  patient estimates 60% improvement (02/23/2023);  Goal status: MET  6.  Patient will score equal or greater than 25 on the Functional Gait Assessment to demonstrate low fall risk.  Baseline: to be tested at later date as appropriate (11/30/2022); FGA =19 (01/11/23); 20/30 (02/23/2023);  Goal status: In-progress   PLAN:  PT FREQUENCY: 1-2x/week  PT DURATION: 12 weeks  PLANNED INTERVENTIONS: Therapeutic exercises, Therapeutic activity, Neuromuscular re-education, Balance training, Gait training, Patient/Family education, Self Care, Joint mobilization, Stair training, DME instructions, Dry Needling, Cognitive remediation, Electrical stimulation, Spinal mobilization, Cryotherapy, Moist heat, Manual therapy, and Re-evaluation.  PLAN FOR NEXT SESSION: Update HEP as appropriate, progressive LE/core/functional strengthening and balance exercises. Education. Manual therapy as needed.   BPPV screen and treat as appropriate   Cira Rue, PT,DPT 03/14/2023,  3:28 PM  [facility] [address] [place], [place] [address] P: [phone] I F: [phone]

## 2023-03-15 ENCOUNTER — Ambulatory Visit: Payer: HMO | Admitting: Physical Therapy

## 2023-03-15 ENCOUNTER — Encounter: Payer: Self-pay | Admitting: Physical Therapy

## 2023-03-15 DIAGNOSIS — M5459 Other low back pain: Secondary | ICD-10-CM

## 2023-03-15 DIAGNOSIS — R2689 Other abnormalities of gait and mobility: Secondary | ICD-10-CM

## 2023-03-15 DIAGNOSIS — M6281 Muscle weakness (generalized): Secondary | ICD-10-CM

## 2023-03-15 DIAGNOSIS — Z9181 History of falling: Secondary | ICD-10-CM

## 2023-03-15 DIAGNOSIS — G8929 Other chronic pain: Secondary | ICD-10-CM

## 2023-03-15 NOTE — Therapy (Signed)
OUTPATIENT PHYSICAL THERAPY TREATMENT    Patient Name: Mary Elliott MRN: [id number] DOB:07/25/1940, 82 y.o., female Today's Date: 03/15/2023  END OF SESSION:  PT End of Session - 03/15/23 1045     Visit Number 15    Number of Visits 40    Date for PT Re-Evaluation 05/18/23    Authorization Type [employer] reporting period from 02/23/2023    Progress Note Due on Visit 20    PT Start Time 1038    PT Stop Time 1110    PT Time Calculation (min) 32 min    Activity Tolerance Patient tolerated treatment well;No increased pain    Behavior During Therapy WFL for tasks assessed/performed                Past Medical History:  Diagnosis Date   Anemia    Anxiety    Arthritis    fingers   Cancer (HCC) 1999   basil cell on face   GERD (gastroesophageal reflux disease)    history of   History of kidney stones    twice   HOH (hard of hearing)    Hypertension    Hypothyroidism    PONV (postoperative nausea and vomiting)    nausea only with 2 prior surgeries   Tinnitus    Past Surgical History:  Procedure Laterality Date   ABDOMINAL HYSTERECTOMY     basil cell     face   BLEPHAROPLASTY     CATARACT EXTRACTION W/PHACO Left 08/26/2020   Procedure: CATARACT EXTRACTION PHACO AND INTRAOCULAR LENS PLACEMENT (IOC) LEFT PANOPTIX TORIC;  Surgeon: Lockie Mola, MD;  Location: [facility];  Service: Ophthalmology;  Laterality: Left;  8.88 1.:27.2 10.1%   CATARACT EXTRACTION W/PHACO Right 09/09/2020   Procedure: CATARACT EXTRACTION PHACO AND INTRAOCULAR LENS PLACEMENT (IOC) RIGHT PANOPTIX TORIC 8.54 01:03.3 13.5%;  Surgeon: Lockie Mola, MD;  Location: [facility];  Service: Ophthalmology;  Laterality: Right;   CYSTOCELE REPAIR  2005   HEMORROIDECTOMY     JOINT REPLACEMENT Bilateral 11/2016   KNEE ARTHROPLASTY Left 11/14/2016   Procedure: COMPUTER ASSISTED TOTAL KNEE ARTHROPLASTY;  Surgeon: Donato Heinz, MD;  Location: [facility];   Service: Orthopedics;  Laterality: Left;   KNEE ARTHROPLASTY Right 03/15/2017   Procedure: COMPUTER ASSISTED TOTAL KNEE ARTHROPLASTY;  Surgeon: Donato Heinz, MD;  Location: [facility];  Service: Orthopedics;  Laterality: Right;   KNEE ARTHROSCOPY Left    LIPOSUCTION     PELVIC FLOOR REPAIR     SHOULDER ARTHROSCOPY WITH DEBRIDEMENT AND BICEP TENDON REPAIR Left 09/24/2019   Procedure: Left shoulder arthroscopy with debridement, decompression, rotator cuff repair, and possible biceps tenodesis.;  Surgeon: Christena Flake, MD;  Location: [facility];  Service: Orthopedics;  Laterality: Left;   TOE SURGERY Right    TONSILLECTOMY     VARICOSE VEIN SURGERY     Patient Active Problem List   Diagnosis Date Noted   Diverticulitis large intestine 05/08/2022   S/P total knee arthroplasty 11/14/2016    PCP: Lynnea Ferrier, MD  REFERRING PROVIDER: Dayton Bailiff, PA  REFERRING DIAG: right groin pain, acute bilateral low back pain without sciatica, left hip pain, weakness of both legs  Rationale for Evaluation and Treatment: Rehabilitation  THERAPY DIAG:  Other low back pain  Other abnormalities of gait and mobility  Chronic pain of both knees  History of falling  Muscle weakness (generalized)  ONSET DATE: possibly chronic but current exacerbation started approximately January 2024  PERTINENT HISTORY:  Patient is a 82 y.o. female who presents to outpatient physical therapy with a referral for medical diagnosis right groin pain, acute bilateral low back pain without sciatica, left hip pain, weakness of both legs. This patient's chief complaints consist of difficulty with mobility, unsteadiness on her feet, falls, low back pain with prolonged standing, and anterior knee pain with kneeling, leading to the following functional deficits: difficulty with kneeling, getting up from the ground, not falling, caring for her husband, stairs, walking, getting up from sitting, bending, lifting, carrying,  gardening, cooking, prolonged standing. Relevant past medical history and comorbidities include plantar fasciitis, right foot pain (3 surgeries), osteopenia she has S/P total knee arthroplasty and Diverticulitis large intestine on their problem list. She  has a past medical history of Anemia, Anxiety, Arthritis, Cancer (HCC) (1999), GERD (gastroesophageal reflux disease), History of kidney stones, HOH (hard of hearing), Hypertension, Hypothyroidism, PONV (postoperative nausea and vomiting), and Tinnitus. She  has a past surgical history that includes Hemorroidectomy; Knee arthroscopy (Left); Liposuction; Blepharoplasty; Toe Surgery (Right); Varicose vein surgery; basil cell; Pelvic floor repair; Abdominal hysterectomy; Knee Arthroplasty (Left, 11/14/2016); Knee Arthroplasty (Right, 03/15/2017); total knee replacement (Bilateral, 11/2016); Cystocele repair (2005); Tonsillectomy; Shoulder arthroscopy with debridement and bicep tendon repair (Left, 09/24/2019); Cataract extraction w/PHACO (Left, 08/26/2020); and Cataract extraction w/PHACO (Right, 09/09/2020). Patient denies hx of stroke, seizures, lung problems, heart problems, diabetes, unexplained weight loss, unexplained changes in bowel or bladder problems, and spinal surgery  SUBJECTIVE:                                                                                                                                                                                           SUBJECTIVE STATEMENT: Patient states she had no dizziness last night. She has no pain currently  PAIN:  NPRS: denies  PRECAUTIONS: Fall  PATIENT GOALS: "stay on my feet" "be able to get up out of a chair" improve her core strength  OBJECTIVE    TODAY'S TREATMENT:     Neuromuscular Re-education: to improve, balance, postural strength, muscle activation patterns, and stabilization strength required for functional activities: Step/stick onto airex with back toe resting on ground without  UE support: 2x10/side with min assist  Squats: x10 to chair, x10 modified to airex (technique was better)  High kneel<>stand transfers with used of armed chair, ~1x3 each side with knees on airex. SBA. (Difficult, heavy UE support).  Colored gumdrop taps - (3 colors were used. Patient had to tap whichever color was called out. Occasionally she had to hold her foot on the gumdrop): 2x15 each foot, intermittant UE support, CGA.  Modified split squats (depth to patient  tolerence): 2x10 per leg with min/CGA, intermittent UE support  Pt required multimodal cuing for proper technique and to facilitate improved neuromuscular control, strength, range of motion, and functional ability resulting in improved performance and form.  PATIENT EDUCATION:  Education details: Exercise purpose/form. Self management techniques. Education on diagnosis, prognosis, POC, anatomy and physiology of current condition. Education on HEP including handout. Person educated: Patient Education method: Explanation, Demonstration, Tactile cues, Verbal cues, and Handouts Education comprehension: verbalized understanding, returned demonstration, and needs further education  HOME EXERCISE PROGRAM: Access Code: [email or web address] URL: [email or web address] Date: 02/15/2023 Prepared by: Norton Blizzard  Exercises - Squat with Chair Touch  - 3-5 x weekly - 3 sets - 10 reps - Standing March with Unilateral Counter Support  - 3-5 x weekly - 3 sets - 10 reps - Standing Knee Flexion with Unilateral Counter Support  - 3-5 x weekly - 3 sets - 10 reps - Standing Hip Abduction with Unilateral Counter Support  - 3-5 x weekly - 3 sets - 10 reps - Heel Toe Raises with Unilateral Counter Support  - 3-5 x weekly - 3 sets - 10 reps  HOME EXERCISE PROGRAM [[email or web address]] View at "[email or web address]" using code: [email or web address] Half Kneel on Padded Chair  -  Repeat 10 Repetitions, Hold 5 Seconds, Complete 2 Sets, Perform 1 Times a Day  HOME EXERCISE  PROGRAM [[email or web address]] View at "[email or web address]" using code: [email or web address] DYNAMIC SINGLE LIMB STANCE - CONE KNOCK DOWN AND SET UP -  Repeat 10 Repetitions, Hold 1 Second(s), Complete 1 Set, Perform 1 Times a Day  Drawing for single leg toe taps, 3x10 each side 3 times a week  ASSESSMENT:  CLINICAL IMPRESSION:  Patient arrives reporting no vertigo since last PT session. Today's session continued to work on balance and functional strengthening. Patient has significant weakness in deeper ROM with squats and lunges, making it hard for her to complete floor transfers and sit to stand without UE support. She also demonstrates diminished ankle strategy which is contributing to her fall risk. Patient would benefit from continued management of limiting condition by skilled physical therapist to address remaining impairments and functional limitations to work towards stated goals and return to PLOF or maximal functional independence.    OBJECTIVE IMPAIRMENTS: Abnormal gait, decreased activity tolerance, decreased balance, decreased endurance, decreased knowledge of condition, decreased knowledge of use of DME, decreased mobility, difficulty walking, decreased ROM, decreased strength, hypomobility, increased edema, impaired perceived functional ability, impaired flexibility, obesity, and pain.   ACTIVITY LIMITATIONS: carrying, lifting, bending, standing, squatting, stairs, transfers, bed mobility, dressing, locomotion level, and caring for others  PARTICIPATION LIMITATIONS: meal prep, cleaning, laundry, interpersonal relationship, shopping, community activity, yard work, and   difficulty with kneeling, getting up from the ground, not falling, caring for her husband, stairs, walking, getting up from sitting, bending, lifting, carrying, gardening, cooking, prolonged standing  PERSONAL FACTORS: Age, Fitness, Past/current experiences, Time since onset of injury/illness/exacerbation, and 3+ comorbidities:   plantar  fasciitis, right foot pain (3 surgeries), osteopenia she has S/P total knee arthroplasty and Diverticulitis large intestine on their problem list. She  has a past medical history of Anemia, Anxiety, Arthritis, Cancer (HCC) (1999), GERD (gastroesophageal reflux disease), History of kidney stones, HOH (hard of hearing), Hypertension, Hypothyroidism, PONV (postoperative nausea and vomiting), and Tinnitus. She  has a past surgical history that includes Hemorroidectomy; Knee arthroscopy (Left); Liposuction; Blepharoplasty; Toe Surgery (Right); Varicose vein surgery; basil cell; Pelvic floor repair; Abdominal hysterectomy; Knee Arthroplasty (Left, 11/14/2016); Knee Arthroplasty (Right, 03/15/2017); total knee replacement (  Bilateral, 11/2016); Cystocele repair (2005); Tonsillectomy; Shoulder arthroscopy with debridement and bicep tendon repair (Left, 09/24/2019); Cataract extraction w/PHACO (Left, 08/26/2020); and Cataract extraction w/PHACO (Right, 09/09/2020) are also affecting patient's functional outcome.   REHAB POTENTIAL: Good  CLINICAL DECISION MAKING: Evolving/moderate complexity  EVALUATION COMPLEXITY: Moderate   GOALS: Goals reviewed with patient? No  SHORT TERM GOALS: Target date: 12/14/2022  Patient will be independent with initial home exercise program for self-management of symptoms. Baseline: Initial HEP provided at IE (11/30/22); Goal status: MET   LONG TERM GOALS: Target date: 02/22/2023. Target date updated to 05/18/2023  unmet goals on 02/23/2023.   Patient will be independent with a long-term home exercise program for self-management of symptoms.  Baseline: Initial HEP provided, longterm program still pending (01/11/23); Patient has been participating well (02/23/2023);  Goal status: In-progress  2.  Patient will demonstrate improved FOTO by equal or greater than 10 points by visit #10 to demonstrate improvement in overall condition and self-reported functional ability.  Baseline: 56  (11/30/22); 51 (pt denies feeling her balance has gotten worse, 01/11/23); 57 at visit #20 (02/23/2023);  Goal status: In-progress  3.  Patient will complete 5 Times Sit To Stand Test in equal or less than 15 seconds from 18.5 inch or less surface with no LOB or use of B UE to improve her ability to get around her home safely and carry items when getting up from a seated position.  Baseline: 23 seconds with frequent LOB backwards including leaning against plinth and one failed attempt. No use of B UE on the plinth. From 18.5 inch plinth (11/30/22); 17sec from 18.5" chair height hands free (01/11/23).  13 seconds from 18.5 inch plinth with no UE support (3rd set able to perform without UE or LOB. 02/23/2023);  Goal status: MET  4.  Patient will demonstrate the ability to complete the Floor Transfer Test in equal or less than 8.8 seconds to improve her ability to get up from the ground when gardening or after a fall.   Baseline: to be tested at a later date (11/30/22); unable to complete floor transfer test without two person assistance (12/13/2022); did not test, has not been a targeted intervention as of yet (01/11/23): 1:09 minutes/seconds with chair (02/23/2023);  Goal status: In-progress  5.  Patient will complete community, work and/or recreational activities with 50% less limitation due to current condition.  Baseline: difficulty with kneeling, getting up from the ground, not falling, caring for her husband, stairs, walking, getting up from sitting, bending, lifting, carrying, gardening, cooking, prolonged standing (11/30/22); pt reports about the same, no big change here (01/11/23);  patient estimates 60% improvement (02/23/2023);  Goal status: MET  6.  Patient will score equal or greater than 25 on the Functional Gait Assessment to demonstrate low fall risk.  Baseline: to be tested at later date as appropriate (11/30/2022); FGA =19 (01/11/23); 20/30 (02/23/2023);  Goal status: In-progress   PLAN:  PT  FREQUENCY: 1-2x/week  PT DURATION: 12 weeks  PLANNED INTERVENTIONS: Therapeutic exercises, Therapeutic activity, Neuromuscular re-education, Balance training, Gait training, Patient/Family education, Self Care, Joint mobilization, Stair training, DME instructions, Dry Needling, Cognitive remediation, Electrical stimulation, Spinal mobilization, Cryotherapy, Moist heat, Manual therapy, and Re-evaluation.  PLAN FOR NEXT SESSION: Update HEP as appropriate, progressive LE/core/functional strengthening and balance exercises. Education. Manual therapy as needed.     Luretha Murphy. Ilsa Iha, PT, DPT 03/15/23, 5:54 PM  [facility] [address] [place], [place] [address] P: [phone] I  F: [phone]   [facility] [address] [place], [place] [address] P: [phone] I F: [phone]

## 2023-03-17 DIAGNOSIS — I10 Essential (primary) hypertension: Secondary | ICD-10-CM | POA: Diagnosis not present

## 2023-03-17 DIAGNOSIS — N3941 Urge incontinence: Secondary | ICD-10-CM | POA: Diagnosis not present

## 2023-03-17 DIAGNOSIS — G629 Polyneuropathy, unspecified: Secondary | ICD-10-CM | POA: Diagnosis not present

## 2023-03-17 DIAGNOSIS — E034 Atrophy of thyroid (acquired): Secondary | ICD-10-CM | POA: Diagnosis not present

## 2023-03-17 DIAGNOSIS — E7800 Pure hypercholesterolemia, unspecified: Secondary | ICD-10-CM | POA: Diagnosis not present

## 2023-03-17 DIAGNOSIS — F3341 Major depressive disorder, recurrent, in partial remission: Secondary | ICD-10-CM | POA: Diagnosis not present

## 2023-03-17 DIAGNOSIS — M1A00X0 Idiopathic chronic gout, unspecified site, without tophus (tophi): Secondary | ICD-10-CM | POA: Diagnosis not present

## 2023-03-17 DIAGNOSIS — Z0000 Encounter for general adult medical examination without abnormal findings: Secondary | ICD-10-CM | POA: Diagnosis not present

## 2023-03-22 ENCOUNTER — Ambulatory Visit: Payer: HMO | Admitting: Physical Therapy

## 2023-03-22 ENCOUNTER — Encounter: Payer: Self-pay | Admitting: Physical Therapy

## 2023-03-22 DIAGNOSIS — M6281 Muscle weakness (generalized): Secondary | ICD-10-CM

## 2023-03-22 DIAGNOSIS — R2689 Other abnormalities of gait and mobility: Secondary | ICD-10-CM

## 2023-03-22 DIAGNOSIS — M5459 Other low back pain: Secondary | ICD-10-CM

## 2023-03-22 DIAGNOSIS — G8929 Other chronic pain: Secondary | ICD-10-CM

## 2023-03-22 DIAGNOSIS — Z9181 History of falling: Secondary | ICD-10-CM

## 2023-03-22 NOTE — Therapy (Signed)
OUTPATIENT PHYSICAL THERAPY TREATMENT    Patient Name: Mary Elliott MRN: [id number] DOB:[birth year], 82 y.o., female Today's Date: 03/22/2023  END OF SESSION:  PT End of Session - 03/22/23 1138     Visit Number 16    Number of Visits 40    Date for PT Re-Evaluation 05/18/23    Authorization Type [employer] reporting period from 02/23/2023    Progress Note Due on Visit 20    PT Start Time 1035    PT Stop Time 1117    PT Time Calculation (min) 42 min    Activity Tolerance Patient tolerated treatment well;No increased pain    Behavior During Therapy WFL for tasks assessed/performed              Past Medical History:  Diagnosis Date   Anemia    Anxiety    Arthritis    fingers   Cancer (HCC) 1999   basil cell on face   GERD (gastroesophageal reflux disease)    history of   History of kidney stones    twice   HOH (hard of hearing)    Hypertension    Hypothyroidism    PONV (postoperative nausea and vomiting)    nausea only with 2 prior surgeries   Tinnitus    Past Surgical History:  Procedure Laterality Date   ABDOMINAL HYSTERECTOMY     basil cell     face   BLEPHAROPLASTY     CATARACT EXTRACTION W/PHACO Left 08/26/2020   Procedure: CATARACT EXTRACTION PHACO AND INTRAOCULAR LENS PLACEMENT (IOC) LEFT PANOPTIX TORIC;  Surgeon: Lockie Mola, MD;  Location: [facility];  Service: Ophthalmology;  Laterality: Left;  8.88 1.:27.2 10.1%   CATARACT EXTRACTION W/PHACO Right 09/09/2020   Procedure: CATARACT EXTRACTION PHACO AND INTRAOCULAR LENS PLACEMENT (IOC) RIGHT PANOPTIX TORIC 8.54 01:03.3 13.5%;  Surgeon: Lockie Mola, MD;  Location: [facility];  Service: Ophthalmology;  Laterality: Right;   CYSTOCELE REPAIR  2005   HEMORROIDECTOMY     JOINT REPLACEMENT Bilateral 11/2016   KNEE ARTHROPLASTY Left 11/14/2016   Procedure: COMPUTER ASSISTED TOTAL KNEE ARTHROPLASTY;  Surgeon: Donato Heinz, MD;  Location: [facility];  Service:  Orthopedics;  Laterality: Left;   KNEE ARTHROPLASTY Right 03/15/2017   Procedure: COMPUTER ASSISTED TOTAL KNEE ARTHROPLASTY;  Surgeon: Donato Heinz, MD;  Location: [facility];  Service: Orthopedics;  Laterality: Right;   KNEE ARTHROSCOPY Left    LIPOSUCTION     PELVIC FLOOR REPAIR     SHOULDER ARTHROSCOPY WITH DEBRIDEMENT AND BICEP TENDON REPAIR Left 09/24/2019   Procedure: Left shoulder arthroscopy with debridement, decompression, rotator cuff repair, and possible biceps tenodesis.;  Surgeon: Christena Flake, MD;  Location: [facility];  Service: Orthopedics;  Laterality: Left;   TOE SURGERY Right    TONSILLECTOMY     VARICOSE VEIN SURGERY     Patient Active Problem List   Diagnosis Date Noted   Diverticulitis large intestine 05/08/2022   S/P total knee arthroplasty 11/14/2016    PCP: Lynnea Ferrier, MD  REFERRING PROVIDER: Dayton Bailiff, PA  REFERRING DIAG: right groin pain, acute bilateral low back pain without sciatica, left hip pain, weakness of both legs  Rationale for Evaluation and Treatment: Rehabilitation  THERAPY DIAG:  Other low back pain  Other abnormalities of gait and mobility  Chronic pain of both knees  History of falling  Muscle weakness (generalized)  ONSET DATE: possibly chronic but current exacerbation started approximately January 2024  PERTINENT HISTORY:  Patient  is a 82 y.o. female who presents to outpatient physical therapy with a referral for medical diagnosis right groin pain, acute bilateral low back pain without sciatica, left hip pain, weakness of both legs. This patient's chief complaints consist of difficulty with mobility, unsteadiness on her feet, falls, low back pain with prolonged standing, and anterior knee pain with kneeling, leading to the following functional deficits: difficulty with kneeling, getting up from the ground, not falling, caring for her husband, stairs, walking, getting up from sitting, bending, lifting, carrying,  gardening, cooking, prolonged standing. Relevant past medical history and comorbidities include plantar fasciitis, right foot pain (3 surgeries), osteopenia she has S/P total knee arthroplasty and Diverticulitis large intestine on their problem list. She  has a past medical history of Anemia, Anxiety, Arthritis, Cancer (HCC) (1999), GERD (gastroesophageal reflux disease), History of kidney stones, HOH (hard of hearing), Hypertension, Hypothyroidism, PONV (postoperative nausea and vomiting), and Tinnitus. She  has a past surgical history that includes Hemorroidectomy; Knee arthroscopy (Left); Liposuction; Blepharoplasty; Toe Surgery (Right); Varicose vein surgery; basil cell; Pelvic floor repair; Abdominal hysterectomy; Knee Arthroplasty (Left, 11/14/2016); Knee Arthroplasty (Right, 03/15/2017); total knee replacement (Bilateral, 11/2016); Cystocele repair (2005); Tonsillectomy; Shoulder arthroscopy with debridement and bicep tendon repair (Left, 09/24/2019); Cataract extraction w/PHACO (Left, 08/26/2020); and Cataract extraction w/PHACO (Right, 09/09/2020). Patient denies hx of stroke, seizures, lung problems, heart problems, diabetes, unexplained weight loss, unexplained changes in bowel or bladder problems, and spinal surgery  SUBJECTIVE:                                                                                                                                                                                           SUBJECTIVE STATEMENT: Patient states her right shoulder has been hurting. She states it is a continuation of when she was working on getting up from the floor in the clinic. She states it was getting better but it is reaggrevated whenever she presses her right UE into the bed when changing positions. She states her dizziness is gone.   PAIN:  NPRS: right shoulder 3-4/10 when it hurts, currently 3/10  PRECAUTIONS: Fall  PATIENT GOALS: "stay on my feet" "be able to get up out of a chair"  improve her core strength  OBJECTIVE    TODAY'S TREATMENT:     Neuromuscular Re-education: to improve, balance, postural strength, muscle activation patterns, and stabilization strength required for functional activities: - Step/stick onto airex with back toe resting on ground without UE support: 1x10 R side forwards, and 1x3 L side forwards with CGA (cuing to put as little weight as possible on back foot, discontinued due to being to easy).  -  single leg balance with 2 fingers on the one hand TM bar (ipsilateral UE support), slow marching with 5 second hold, 2x10 right LE, 1x10 on left LE.  - standing knee to the treadmill to assess dorsiflexion with flexed knees (looked WFL bilaterally).  - runner's step up to bottom step of stairs (6 inches), 2x10 each side (2nd set while holding 8#DB in contralateral UE), B UE support without weight, ipsilateral UE support with weight). Tibial ER force applied manually to left LE during last 4 reps of last left step up.   Pt required multimodal cuing for proper technique and to facilitate improved neuromuscular control, strength, range of motion, and functional ability resulting in improved performance and form.  PATIENT EDUCATION:  Education details: Exercise purpose/form. Self management techniques. Education on diagnosis, prognosis, POC, anatomy and physiology of current condition. Education on HEP including handout. Person educated: Patient Education method: Explanation, Demonstration, Tactile cues, Verbal cues, and Handouts Education comprehension: verbalized understanding, returned demonstration, and needs further education  HOME EXERCISE PROGRAM: Access Code: [email or web address] URL: [email or web address] Date: 02/15/2023 Prepared by: Norton Blizzard  Exercises - Squat with Chair Touch  - 3-5 x weekly - 3 sets - 10 reps - Standing March with Unilateral Counter Support  - 3-5 x weekly - 3 sets - 10 reps - Standing Knee Flexion with Unilateral  Counter Support  - 3-5 x weekly - 3 sets - 10 reps - Standing Hip Abduction with Unilateral Counter Support  - 3-5 x weekly - 3 sets - 10 reps - Heel Toe Raises with Unilateral Counter Support  - 3-5 x weekly - 3 sets - 10 reps  HOME EXERCISE PROGRAM [[email or web address]] View at "[email or web address]" using code: [email or web address] Half Kneel on Padded Chair  -  Repeat 10 Repetitions, Hold 5 Seconds, Complete 2 Sets, Perform 1 Times a Day  HOME EXERCISE PROGRAM [[email or web address]] View at "[email or web address]" using code: [email or web address] DYNAMIC SINGLE LIMB STANCE - CONE KNOCK DOWN AND SET UP -  Repeat 10 Repetitions, Hold 1 Second(s), Complete 1 Set, Perform 1 Times a Day  Drawing for single leg toe taps, 3x10 each side 3 times a week  ASSESSMENT:  CLINICAL IMPRESSION:  Patient arrives reporting continued difficulty with R shoulder pain since last PT session. Minimized R UE use today and monitored for pain there to prevent irritation of R shoulder pain. Patient continues to demonstrate poor ankle strategy, but her dorsiflexion ROM was not a limiting factor when tested. She continues to use a lot of stepping strategy instead of ankle strategy. Strength testing for R eversion WNL, so unlikely to be contributing. Continues to have generalized deconditioning and lacks quad strength. Would benefit from continued interventions to improve her functional strength and balance. She has not increased Patient would benefit from continued management of limiting condition by skilled physical therapist to address remaining impairments and functional limitations to work towards stated goals and return to PLOF or maximal functional independence.    OBJECTIVE IMPAIRMENTS: Abnormal gait, decreased activity tolerance, decreased balance, decreased endurance, decreased knowledge of condition, decreased knowledge of use of DME, decreased mobility, difficulty walking, decreased ROM, decreased strength, hypomobility, increased edema, impaired perceived  functional ability, impaired flexibility, obesity, and pain.   ACTIVITY LIMITATIONS: carrying, lifting, bending, standing, squatting, stairs, transfers, bed mobility, dressing, locomotion level, and caring for others  PARTICIPATION LIMITATIONS: meal prep, cleaning, laundry, interpersonal relationship, shopping, community activity, yard work, and   difficulty with kneeling, getting up from the ground, not falling, caring for her husband, stairs, walking,  getting up from sitting, bending, lifting, carrying, gardening, cooking, prolonged standing  PERSONAL FACTORS: Age, Fitness, Past/current experiences, Time since onset of injury/illness/exacerbation, and 3+ comorbidities:   plantar fasciitis, right foot pain (3 surgeries), osteopenia she has S/P total knee arthroplasty and Diverticulitis large intestine on their problem list. She  has a past medical history of Anemia, Anxiety, Arthritis, Cancer (HCC) (1999), GERD (gastroesophageal reflux disease), History of kidney stones, HOH (hard of hearing), Hypertension, Hypothyroidism, PONV (postoperative nausea and vomiting), and Tinnitus. She  has a past surgical history that includes Hemorroidectomy; Knee arthroscopy (Left); Liposuction; Blepharoplasty; Toe Surgery (Right); Varicose vein surgery; basil cell; Pelvic floor repair; Abdominal hysterectomy; Knee Arthroplasty (Left, 11/14/2016); Knee Arthroplasty (Right, 03/15/2017); total knee replacement (Bilateral, 11/2016); Cystocele repair (2005); Tonsillectomy; Shoulder arthroscopy with debridement and bicep tendon repair (Left, 09/24/2019); Cataract extraction w/PHACO (Left, 08/26/2020); and Cataract extraction w/PHACO (Right, 09/09/2020) are also affecting patient's functional outcome.   REHAB POTENTIAL: Good  CLINICAL DECISION MAKING: Evolving/moderate complexity  EVALUATION COMPLEXITY: Moderate   GOALS: Goals reviewed with patient? No  SHORT TERM GOALS: Target date: 12/14/2022  Patient will be independent with  initial home exercise program for self-management of symptoms. Baseline: Initial HEP provided at IE (11/30/22); Goal status: MET   LONG TERM GOALS: Target date: 02/22/2023. Target date updated to 05/18/2023  unmet goals on 02/23/2023.   Patient will be independent with a long-term home exercise program for self-management of symptoms.  Baseline: Initial HEP provided, longterm program still pending (01/11/23); Patient has been participating well (02/23/2023);  Goal status: In-progress  2.  Patient will demonstrate improved FOTO by equal or greater than 10 points by visit #10 to demonstrate improvement in overall condition and self-reported functional ability.  Baseline: 56 (11/30/22); 51 (pt denies feeling her balance has gotten worse, 01/11/23); 57 at visit #20 (02/23/2023);  Goal status: In-progress  3.  Patient will complete 5 Times Sit To Stand Test in equal or less than 15 seconds from 18.5 inch or less surface with no LOB or use of B UE to improve her ability to get around her home safely and carry items when getting up from a seated position.  Baseline: 23 seconds with frequent LOB backwards including leaning against plinth and one failed attempt. No use of B UE on the plinth. From 18.5 inch plinth (11/30/22); 17sec from 18.5" chair height hands free (01/11/23).  13 seconds from 18.5 inch plinth with no UE support (3rd set able to perform without UE or LOB. 02/23/2023);  Goal status: MET  4.  Patient will demonstrate the ability to complete the Floor Transfer Test in equal or less than 8.8 seconds to improve her ability to get up from the ground when gardening or after a fall.   Baseline: to be tested at a later date (11/30/22); unable to complete floor transfer test without two person assistance (12/13/2022); did not test, has not been a targeted intervention as of yet (01/11/23): 1:09 minutes/seconds with chair (02/23/2023);  Goal status: In-progress  5.  Patient will complete community, work  and/or recreational activities with 50% less limitation due to current condition.  Baseline: difficulty with kneeling, getting up from the ground, not falling, caring for her husband, stairs, walking, getting up from sitting, bending, lifting, carrying, gardening, cooking, prolonged standing (11/30/22); pt reports about the same, no big change here (01/11/23);  patient estimates 60% improvement (02/23/2023);  Goal status: MET  6.  Patient will score equal or greater than 25 on the Functional Gait Assessment to  demonstrate low fall risk.  Baseline: to be tested at later date as appropriate (11/30/2022); FGA =19 (01/11/23); 20/30 (02/23/2023);  Goal status: In-progress   PLAN:  PT FREQUENCY: 1-2x/week  PT DURATION: 12 weeks  PLANNED INTERVENTIONS: Therapeutic exercises, Therapeutic activity, Neuromuscular re-education, Balance training, Gait training, Patient/Family education, Self Care, Joint mobilization, Stair training, DME instructions, Dry Needling, Cognitive remediation, Electrical stimulation, Spinal mobilization, Cryotherapy, Moist heat, Manual therapy, and Re-evaluation.  PLAN FOR NEXT SESSION: Update HEP as appropriate, progressive LE/core/functional strengthening and balance exercises. Education. Manual therapy as needed.     Luretha Murphy. Ilsa Iha, PT, DPT 03/22/23, 11:46 AM  [facility] [address] [place], [place] [address] P: [phone] Cherylann Parr: [phone]   [facility] [address] [place], [place] [address] P: [phone] I F: [phone]

## 2023-03-29 ENCOUNTER — Encounter: Payer: Self-pay | Admitting: Physical Therapy

## 2023-03-29 ENCOUNTER — Ambulatory Visit: Payer: HMO | Admitting: Physical Therapy

## 2023-03-29 DIAGNOSIS — M6281 Muscle weakness (generalized): Secondary | ICD-10-CM

## 2023-03-29 DIAGNOSIS — M5459 Other low back pain: Secondary | ICD-10-CM | POA: Diagnosis not present

## 2023-03-29 DIAGNOSIS — G8929 Other chronic pain: Secondary | ICD-10-CM

## 2023-03-29 DIAGNOSIS — R2689 Other abnormalities of gait and mobility: Secondary | ICD-10-CM

## 2023-03-29 DIAGNOSIS — Z9181 History of falling: Secondary | ICD-10-CM

## 2023-03-29 NOTE — Therapy (Cosign Needed)
OUTPATIENT PHYSICAL THERAPY TREATMENT    Patient Name: Mary Elliott MRN: [id number] DOB:28-Oct-1940, 82 y.o., female Today's Date: 03/30/2023  END OF SESSION:  PT End of Session - 03/29/23 1100     Visit Number 17    Number of Visits 40    Date for PT Re-Evaluation 05/18/23    Authorization Type [employer] reporting period from 02/23/2023    Progress Note Due on Visit 20    PT Start Time 1040    PT Stop Time 1115    PT Time Calculation (min) 35 min    Activity Tolerance Patient tolerated treatment well;No increased pain    Behavior During Therapy WFL for tasks assessed/performed               Past Medical History:  Diagnosis Date   Anemia    Anxiety    Arthritis    fingers   Cancer (HCC) 1999   basil cell on face   GERD (gastroesophageal reflux disease)    history of   History of kidney stones    twice   HOH (hard of hearing)    Hypertension    Hypothyroidism    PONV (postoperative nausea and vomiting)    nausea only with 2 prior surgeries   Tinnitus    Past Surgical History:  Procedure Laterality Date   ABDOMINAL HYSTERECTOMY     basil cell     face   BLEPHAROPLASTY     CATARACT EXTRACTION W/PHACO Left 08/26/2020   Procedure: CATARACT EXTRACTION PHACO AND INTRAOCULAR LENS PLACEMENT (IOC) LEFT PANOPTIX TORIC;  Surgeon: Lockie Mola, MD;  Location: [facility];  Service: Ophthalmology;  Laterality: Left;  8.88 1.:27.2 10.1%   CATARACT EXTRACTION W/PHACO Right 09/09/2020   Procedure: CATARACT EXTRACTION PHACO AND INTRAOCULAR LENS PLACEMENT (IOC) RIGHT PANOPTIX TORIC 8.54 01:03.3 13.5%;  Surgeon: Lockie Mola, MD;  Location: [facility];  Service: Ophthalmology;  Laterality: Right;   CYSTOCELE REPAIR  2005   HEMORROIDECTOMY     JOINT REPLACEMENT Bilateral 11/2016   KNEE ARTHROPLASTY Left 11/14/2016   Procedure: COMPUTER ASSISTED TOTAL KNEE ARTHROPLASTY;  Surgeon: Donato Heinz, MD;  Location: [facility];  Service:  Orthopedics;  Laterality: Left;   KNEE ARTHROPLASTY Right 03/15/2017   Procedure: COMPUTER ASSISTED TOTAL KNEE ARTHROPLASTY;  Surgeon: Donato Heinz, MD;  Location: [facility];  Service: Orthopedics;  Laterality: Right;   KNEE ARTHROSCOPY Left    LIPOSUCTION     PELVIC FLOOR REPAIR     SHOULDER ARTHROSCOPY WITH DEBRIDEMENT AND BICEP TENDON REPAIR Left 09/24/2019   Procedure: Left shoulder arthroscopy with debridement, decompression, rotator cuff repair, and possible biceps tenodesis.;  Surgeon: Christena Flake, MD;  Location: [facility];  Service: Orthopedics;  Laterality: Left;   TOE SURGERY Right    TONSILLECTOMY     VARICOSE VEIN SURGERY     Patient Active Problem List   Diagnosis Date Noted   Diverticulitis large intestine 05/08/2022   S/P total knee arthroplasty 11/14/2016    PCP: Lynnea Ferrier, MD  REFERRING PROVIDER: Dayton Bailiff, PA  REFERRING DIAG: right groin pain, acute bilateral low back pain without sciatica, left hip pain, weakness of both legs  Rationale for Evaluation and Treatment: Rehabilitation  THERAPY DIAG:  Other low back pain  Other abnormalities of gait and mobility  Chronic pain of both knees  History of falling  Muscle weakness (generalized)  ONSET DATE: possibly chronic but current exacerbation started approximately January 2024  PERTINENT HISTORY:  Patient is a 82 y.o. female who presents to outpatient physical therapy with a referral for medical diagnosis right groin pain, acute bilateral low back pain without sciatica, left hip pain, weakness of both legs. This patient's chief complaints consist of difficulty with mobility, unsteadiness on her feet, falls, low back pain with prolonged standing, and anterior knee pain with kneeling, leading to the following functional deficits: difficulty with kneeling, getting up from the ground, not falling, caring for her husband, stairs, walking, getting up from sitting, bending, lifting, carrying,  gardening, cooking, prolonged standing. Relevant past medical history and comorbidities include plantar fasciitis, right foot pain (3 surgeries), osteopenia she has S/P total knee arthroplasty and Diverticulitis large intestine on their problem list. She  has a past medical history of Anemia, Anxiety, Arthritis, Cancer (HCC) (1999), GERD (gastroesophageal reflux disease), History of kidney stones, HOH (hard of hearing), Hypertension, Hypothyroidism, PONV (postoperative nausea and vomiting), and Tinnitus. She  has a past surgical history that includes Hemorroidectomy; Knee arthroscopy (Left); Liposuction; Blepharoplasty; Toe Surgery (Right); Varicose vein surgery; basil cell; Pelvic floor repair; Abdominal hysterectomy; Knee Arthroplasty (Left, 11/14/2016); Knee Arthroplasty (Right, 03/15/2017); total knee replacement (Bilateral, 11/2016); Cystocele repair (2005); Tonsillectomy; Shoulder arthroscopy with debridement and bicep tendon repair (Left, 09/24/2019); Cataract extraction w/PHACO (Left, 08/26/2020); and Cataract extraction w/PHACO (Right, 09/09/2020). Patient denies hx of stroke, seizures, lung problems, heart problems, diabetes, unexplained weight loss, unexplained changes in bowel or bladder problems, and spinal surgery  SUBJECTIVE:                                                                                                                                                                                           SUBJECTIVE STATEMENT: Patient reports she had a finger infection and was given a strong antibiotic. She reports it messed up her focus cognitively.  She reports that last week she did not feel good about her therapy because she could not participate adequately.  She reports that her shoulder was getting worse after PT.  She stopped taking her antibiotics on Friday and felt that her head was clearing up (focus wise).  She experienced knee pain after her last session.  PAIN:  NPRS: right shoulder  1-2/10 on right shoulder (ache)  PRECAUTIONS: Fall  PATIENT GOALS: "stay on my feet" "be able to get up out of a chair" improve her core strength  OBJECTIVE    TODAY'S TREATMENT:      Neuromuscular Re-education: to improve, balance, postural strength, muscle activation patterns, and stabilization strength required for functional activities:  - Baps board in single leg stance x20 DF/PF on right foot. Tried INV/EV and it was  too challenging. Started on baps level 3 and regressed to level 1 and it was still too hard. Regressed to wobble board. X20 INV/EV on right foot. X20 INV/EV on left foot, x20 DF/PF on left foot. All were done with bilateral UE support  Therapeutic exercise: to centralize symptoms and improve ROM, strength, muscular endurance, and activity tolerance required for successful completion of functional activities.    - Isometric squat hold 4x20 seconds (rest self-selected) with blazepod tap on last 2 sets (trouble getting low)  - Leg Press x10 with 75 lbs, x5 with 95 lbs    Pt required multimodal cuing for proper technique and to facilitate improved neuromuscular control, strength, range of motion, and functional ability resulting in improved performance and form.  PATIENT EDUCATION:  Education details: Exercise purpose/form. Self management techniques. Education on diagnosis, prognosis, POC, anatomy and physiology of current condition. Education on HEP including handout. Person educated: Patient Education method: Explanation, Demonstration, Tactile cues, Verbal cues, and Handouts Education comprehension: verbalized understanding, returned demonstration, and needs further education  HOME EXERCISE PROGRAM: Access Code: [email or web address] URL: [email or web address] Date: 02/15/2023 Prepared by: Norton Blizzard  Exercises - Squat with Chair Touch  - 3-5 x weekly - 3 sets - 10 reps - Standing March with Unilateral Counter Support  - 3-5 x weekly - 3 sets - 10 reps -  Standing Knee Flexion with Unilateral Counter Support  - 3-5 x weekly - 3 sets - 10 reps - Standing Hip Abduction with Unilateral Counter Support  - 3-5 x weekly - 3 sets - 10 reps - Heel Toe Raises with Unilateral Counter Support  - 3-5 x weekly - 3 sets - 10 reps  HOME EXERCISE PROGRAM [[email or web address]] View at "[email or web address]" using code: [email or web address] Half Kneel on Padded Chair  -  Repeat 10 Repetitions, Hold 5 Seconds, Complete 2 Sets, Perform 1 Times a Day  HOME EXERCISE PROGRAM [[email or web address]] View at "[email or web address]" using code: [email or web address] DYNAMIC SINGLE LIMB STANCE - CONE KNOCK DOWN AND SET UP -  Repeat 10 Repetitions, Hold 1 Second(s), Complete 1 Set, Perform 1 Times a Day  Drawing for single leg toe taps, 3x10 each side 3 times a week  ASSESSMENT:  CLINICAL IMPRESSION:  Patient arrives with increased shoulder pain since last visit, as well as reports of decreased cognition and focus due to antibiotics that were given to her after a finger infection.  She showed increased processing time while speaking and during exercises, often requiring multiple explanations/demonstrations for the exercises. She initially struggled to use her ankle strategy, consistent with previous sessions, but started to improve on it when using the wobble board that "forced" her to use it.  She also demonstrated decreased endurance on the isometric wedge squat, struggling to stay low throughout the duration. Patient would benefit from continued management of limiting condition by skilled physical therapist to address remaining impairments and functional limitations to work towards stated goals and return to PLOF or maximal functional independence.      OBJECTIVE IMPAIRMENTS: Abnormal gait, decreased activity tolerance, decreased balance, decreased endurance, decreased knowledge of condition, decreased knowledge of use of DME, decreased mobility, difficulty walking, decreased ROM, decreased strength, hypomobility, increased  edema, impaired perceived functional ability, impaired flexibility, obesity, and pain.   ACTIVITY LIMITATIONS: carrying, lifting, bending, standing, squatting, stairs, transfers, bed mobility, dressing, locomotion level, and caring for others  PARTICIPATION LIMITATIONS: meal prep, cleaning, laundry, interpersonal relationship, shopping, community activity, yard work, and   difficulty with kneeling, getting up from the ground, not falling,  caring for her husband, stairs, walking, getting up from sitting, bending, lifting, carrying, gardening, cooking, prolonged standing  PERSONAL FACTORS: Age, Fitness, Past/current experiences, Time since onset of injury/illness/exacerbation, and 3+ comorbidities:   plantar fasciitis, right foot pain (3 surgeries), osteopenia she has S/P total knee arthroplasty and Diverticulitis large intestine on their problem list. She  has a past medical history of Anemia, Anxiety, Arthritis, Cancer (HCC) (1999), GERD (gastroesophageal reflux disease), History of kidney stones, HOH (hard of hearing), Hypertension, Hypothyroidism, PONV (postoperative nausea and vomiting), and Tinnitus. She  has a past surgical history that includes Hemorroidectomy; Knee arthroscopy (Left); Liposuction; Blepharoplasty; Toe Surgery (Right); Varicose vein surgery; basil cell; Pelvic floor repair; Abdominal hysterectomy; Knee Arthroplasty (Left, 11/14/2016); Knee Arthroplasty (Right, 03/15/2017); total knee replacement (Bilateral, 11/2016); Cystocele repair (2005); Tonsillectomy; Shoulder arthroscopy with debridement and bicep tendon repair (Left, 09/24/2019); Cataract extraction w/PHACO (Left, 08/26/2020); and Cataract extraction w/PHACO (Right, 09/09/2020) are also affecting patient's functional outcome.   REHAB POTENTIAL: Good  CLINICAL DECISION MAKING: Evolving/moderate complexity  EVALUATION COMPLEXITY: Moderate   GOALS: Goals reviewed with patient? No  SHORT TERM GOALS: Target date:  12/14/2022  Patient will be independent with initial home exercise program for self-management of symptoms. Baseline: Initial HEP provided at IE (11/30/22); Goal status: MET   LONG TERM GOALS: Target date: 02/22/2023. Target date updated to 05/18/2023  unmet goals on 02/23/2023.   Patient will be independent with a long-term home exercise program for self-management of symptoms.  Baseline: Initial HEP provided, longterm program still pending (01/11/23); Patient has been participating well (02/23/2023);  Goal status: In-progress  2.  Patient will demonstrate improved FOTO by equal or greater than 10 points by visit #10 to demonstrate improvement in overall condition and self-reported functional ability.  Baseline: 56 (11/30/22); 51 (pt denies feeling her balance has gotten worse, 01/11/23); 57 at visit #20 (02/23/2023);  Goal status: In-progress  3.  Patient will complete 5 Times Sit To Stand Test in equal or less than 15 seconds from 18.5 inch or less surface with no LOB or use of B UE to improve her ability to get around her home safely and carry items when getting up from a seated position.  Baseline: 23 seconds with frequent LOB backwards including leaning against plinth and one failed attempt. No use of B UE on the plinth. From 18.5 inch plinth (11/30/22); 17sec from 18.5" chair height hands free (01/11/23).  13 seconds from 18.5 inch plinth with no UE support (3rd set able to perform without UE or LOB. 02/23/2023);  Goal status: MET  4.  Patient will demonstrate the ability to complete the Floor Transfer Test in equal or less than 8.8 seconds to improve her ability to get up from the ground when gardening or after a fall.   Baseline: to be tested at a later date (11/30/22); unable to complete floor transfer test without two person assistance (12/13/2022); did not test, has not been a targeted intervention as of yet (01/11/23): 1:09 minutes/seconds with chair (02/23/2023);  Goal status:  In-progress  5.  Patient will complete community, work and/or recreational activities with 50% less limitation due to current condition.  Baseline: difficulty with kneeling, getting up from the ground, not falling, caring for her husband, stairs, walking, getting up from sitting, bending, lifting, carrying, gardening, cooking, prolonged standing (11/30/22); pt reports about the same, no big change here (01/11/23);  patient estimates 60% improvement (02/23/2023);  Goal status: MET  6.  Patient will score equal or greater than 25  on the Functional Gait Assessment to demonstrate low fall risk.  Baseline: to be tested at later date as appropriate (11/30/2022); FGA =19 (01/11/23); 20/30 (02/23/2023);  Goal status: In-progress   PLAN:  PT FREQUENCY: 1-2x/week  PT DURATION: 12 weeks  PLANNED INTERVENTIONS: Therapeutic exercises, Therapeutic activity, Neuromuscular re-education, Balance training, Gait training, Patient/Family education, Self Care, Joint mobilization, Stair training, DME instructions, Dry Needling, Cognitive remediation, Electrical stimulation, Spinal mobilization, Cryotherapy, Moist heat, Manual therapy, and Re-evaluation.  PLAN FOR NEXT SESSION: Update HEP as appropriate, progressive LE/core/functional strengthening and balance exercises. Education. Manual therapy as needed.   Melaysia Streed, [place], Student-PT   [place]. Ilsa Iha, PT, DPT 03/30/23, 3:50 PM  [facility] [address] [place], [place] [address] P: [phone] I F: [phone]

## 2023-04-05 ENCOUNTER — Ambulatory Visit: Payer: HMO | Admitting: Physical Therapy

## 2023-04-05 ENCOUNTER — Encounter: Payer: Self-pay | Admitting: Physical Therapy

## 2023-04-05 DIAGNOSIS — M5459 Other low back pain: Secondary | ICD-10-CM

## 2023-04-05 DIAGNOSIS — R2689 Other abnormalities of gait and mobility: Secondary | ICD-10-CM

## 2023-04-05 DIAGNOSIS — Z9181 History of falling: Secondary | ICD-10-CM

## 2023-04-05 DIAGNOSIS — M6281 Muscle weakness (generalized): Secondary | ICD-10-CM

## 2023-04-05 DIAGNOSIS — G8929 Other chronic pain: Secondary | ICD-10-CM

## 2023-04-05 NOTE — Therapy (Addendum)
OUTPATIENT PHYSICAL THERAPY TREATMENT    Patient Name: Mary Elliott MRN: [id number] DOB:19-Oct-1940, 82 y.o., female Today's Date: 04/05/2023  END OF SESSION:  PT End of Session - 04/05/23 1143     Visit Number 18    Number of Visits 40    Date for PT Re-Evaluation 05/18/23    Authorization Type [employer] reporting period from 02/23/2023    Progress Note Due on Visit 20    PT Start Time 1035    PT Stop Time 1117    PT Time Calculation (min) 42 min    Equipment Utilized During Treatment Gait belt    Activity Tolerance Patient tolerated treatment well;Patient limited by fatigue    Behavior During Therapy WFL for tasks assessed/performed;Flat affect                Past Medical History:  Diagnosis Date   Anemia    Anxiety    Arthritis    fingers   Cancer (HCC) 1999   basil cell on face   GERD (gastroesophageal reflux disease)    history of   History of kidney stones    twice   HOH (hard of hearing)    Hypertension    Hypothyroidism    PONV (postoperative nausea and vomiting)    nausea only with 2 prior surgeries   Tinnitus    Past Surgical History:  Procedure Laterality Date   ABDOMINAL HYSTERECTOMY     basil cell     face   BLEPHAROPLASTY     CATARACT EXTRACTION W/PHACO Left 08/26/2020   Procedure: CATARACT EXTRACTION PHACO AND INTRAOCULAR LENS PLACEMENT (IOC) LEFT PANOPTIX TORIC;  Surgeon: Lockie Mola, MD;  Location: [facility];  Service: Ophthalmology;  Laterality: Left;  8.88 1.:27.2 10.1%   CATARACT EXTRACTION W/PHACO Right 09/09/2020   Procedure: CATARACT EXTRACTION PHACO AND INTRAOCULAR LENS PLACEMENT (IOC) RIGHT PANOPTIX TORIC 8.54 01:03.3 13.5%;  Surgeon: Lockie Mola, MD;  Location: [facility];  Service: Ophthalmology;  Laterality: Right;   CYSTOCELE REPAIR  2005   HEMORROIDECTOMY     JOINT REPLACEMENT Bilateral 11/2016   KNEE ARTHROPLASTY Left 11/14/2016   Procedure: COMPUTER ASSISTED TOTAL KNEE  ARTHROPLASTY;  Surgeon: Donato Heinz, MD;  Location: [facility];  Service: Orthopedics;  Laterality: Left;   KNEE ARTHROPLASTY Right 03/15/2017   Procedure: COMPUTER ASSISTED TOTAL KNEE ARTHROPLASTY;  Surgeon: Donato Heinz, MD;  Location: [facility];  Service: Orthopedics;  Laterality: Right;   KNEE ARTHROSCOPY Left    LIPOSUCTION     PELVIC FLOOR REPAIR     SHOULDER ARTHROSCOPY WITH DEBRIDEMENT AND BICEP TENDON REPAIR Left 09/24/2019   Procedure: Left shoulder arthroscopy with debridement, decompression, rotator cuff repair, and possible biceps tenodesis.;  Surgeon: Christena Flake, MD;  Location: [facility];  Service: Orthopedics;  Laterality: Left;   TOE SURGERY Right    TONSILLECTOMY     VARICOSE VEIN SURGERY     Patient Active Problem List   Diagnosis Date Noted   Diverticulitis large intestine 05/08/2022   S/P total knee arthroplasty 11/14/2016    PCP: Lynnea Ferrier, MD  REFERRING PROVIDER: Dayton Bailiff, PA  REFERRING DIAG: right groin pain, acute bilateral low back pain without sciatica, left hip pain, weakness of both legs  Rationale for Evaluation and Treatment: Rehabilitation  THERAPY DIAG:  Muscle weakness (generalized)  Chronic pain of both knees  Other abnormalities of gait and mobility  Other low back pain  History of falling  ONSET DATE: possibly  chronic but current exacerbation started approximately January 2024  PERTINENT HISTORY:  Patient is a 82 y.o. female who presents to outpatient physical therapy with a referral for medical diagnosis right groin pain, acute bilateral low back pain without sciatica, left hip pain, weakness of both legs. This patient's chief complaints consist of difficulty with mobility, unsteadiness on her feet, falls, low back pain with prolonged standing, and anterior knee pain with kneeling, leading to the following functional deficits: difficulty with kneeling, getting up from the ground, not falling, caring for her husband,  stairs, walking, getting up from sitting, bending, lifting, carrying, gardening, cooking, prolonged standing. Relevant past medical history and comorbidities include plantar fasciitis, right foot pain (3 surgeries), osteopenia she has S/P total knee arthroplasty and Diverticulitis large intestine on their problem list. She  has a past medical history of Anemia, Anxiety, Arthritis, Cancer (HCC) (1999), GERD (gastroesophageal reflux disease), History of kidney stones, HOH (hard of hearing), Hypertension, Hypothyroidism, PONV (postoperative nausea and vomiting), and Tinnitus. She  has a past surgical history that includes Hemorroidectomy; Knee arthroscopy (Left); Liposuction; Blepharoplasty; Toe Surgery (Right); Varicose vein surgery; basil cell; Pelvic floor repair; Abdominal hysterectomy; Knee Arthroplasty (Left, 11/14/2016); Knee Arthroplasty (Right, 03/15/2017); total knee replacement (Bilateral, 11/2016); Cystocele repair (2005); Tonsillectomy; Shoulder arthroscopy with debridement and bicep tendon repair (Left, 09/24/2019); Cataract extraction w/PHACO (Left, 08/26/2020); and Cataract extraction w/PHACO (Right, 09/09/2020). Patient denies hx of stroke, seizures, lung problems, heart problems, diabetes, unexplained weight loss, unexplained changes in bowel or bladder problems, and spinal surgery  SUBJECTIVE:                                                                                                                                                                                           SUBJECTIVE STATEMENT: Shoulder feeling okay today and has been feeling better since avoiding aggravating activities.  Knee is feeling okay. Sometimes pain was up to a 4 in the shoulder this past week. Put on a lidocain patch when it was very bad, and that helped.  PAIN:  NPRS: 0/10.   PRECAUTIONS: Fall  PATIENT GOALS: "stay on my feet" "be able to get up out of a chair" improve her core strength  OBJECTIVE    TODAY'S  TREATMENT:      Neuromuscular Re-education: to improve, balance, postural strength, muscle activation patterns, and stabilization strength required for functional activities:  Wobble board in SL stance L leg: 2x20 DF/PF, 2x20 INV/EV R leg: 2x20 DF/PF, x20, x15 INV/EV (esp INV/EV was very challenging, but better once she "found a rhythm."  Set 1 with bilateral UE support, CGA Set 2 with unilateral UE support (holding  with L hand only due to R shoulder pain), CGA  Therapeutic exercise: to centralize symptoms and improve ROM, strength, muscular endurance, and activity tolerance required for successful completion of functional activities.    - Wall sit 2x20 seconds at approx 90 degrees with min-mod assist getting up and CGA during the wall sit (mod A getting up after last set)  - Leg Press 3x10: 45 lbs, 55 lbs, 65 lbs (seat 4 holes showing)    Pt required multimodal cuing for proper technique and to facilitate improved neuromuscular control, strength, range of motion, and functional ability resulting in improved performance and form.  PATIENT EDUCATION:  Education details: Exercise purpose/form. Self management techniques. Education on diagnosis, prognosis, POC, anatomy and physiology of current condition. Education on HEP including handout. Person educated: Patient Education method: Explanation, Demonstration, Tactile cues, Verbal cues, and Handouts Education comprehension: verbalized understanding, returned demonstration, and needs further education  HOME EXERCISE PROGRAM: Access Code: [email or web address] URL: [email or web address] Date: 02/15/2023 Prepared by: Norton Blizzard  Exercises - Squat with Chair Touch  - 3-5 x weekly - 3 sets - 10 reps - Standing March with Unilateral Counter Support  - 3-5 x weekly - 3 sets - 10 reps - Standing Knee Flexion with Unilateral Counter Support  - 3-5 x weekly - 3 sets - 10 reps - Standing Hip Abduction with Unilateral Counter Support  - 3-5 x  weekly - 3 sets - 10 reps - Heel Toe Raises with Unilateral Counter Support  - 3-5 x weekly - 3 sets - 10 reps  HOME EXERCISE PROGRAM [[email or web address]] View at "[email or web address]" using code: [email or web address] Half Kneel on Padded Chair  -  Repeat 10 Repetitions, Hold 5 Seconds, Complete 2 Sets, Perform 1 Times a Day  HOME EXERCISE PROGRAM [[email or web address]] View at "[email or web address]" using code: [email or web address] DYNAMIC SINGLE LIMB STANCE - CONE KNOCK DOWN AND SET UP -  Repeat 10 Repetitions, Hold 1 Second(s), Complete 1 Set, Perform 1 Times a Day  Drawing for single leg toe taps, 3x10 each side 3 times a week  ASSESSMENT:  CLINICAL IMPRESSION:  Patient arrives with decreased shoulder pain since last week, but she still said she feels "slow" today.  She continues to require increased processing time while speaking and during exercises.  She had considerable difficulty with the wobble board exercises, especially in the inv/ev on her right side when it was progressed to unilateral support.  The entire wobble board session took around 18 minutes due to this difficulty. She expressed some frustration with her difficulty finding a rhythm, and showed difficulty with the motor control of initiating the ankle movement.  Reps were cut down to 15 instead of 20 on the second set as a result of the time and fatigue.  Patient was able to get lower during her isometric squat by doing a wall sit, but demonstrated decreased endurance, only able to tolerate 2 sets. Patient would benefit from continued management of limiting condition by skilled physical therapist to address remaining impairments and functional limitations to work towards stated goals and return to PLOF or maximal functional independence.    OBJECTIVE IMPAIRMENTS: Abnormal gait, decreased activity tolerance, decreased balance, decreased endurance, decreased knowledge of condition, decreased knowledge of use of DME, decreased mobility, difficulty walking, decreased ROM,  decreased strength, hypomobility, increased edema, impaired perceived functional ability, impaired flexibility, obesity, and pain.   ACTIVITY LIMITATIONS: carrying, lifting, bending, standing, squatting, stairs, transfers, bed mobility, dressing, locomotion level, and caring for others  PARTICIPATION LIMITATIONS: meal prep, cleaning, laundry,  interpersonal relationship, shopping, community activity, yard work, and   difficulty with kneeling, getting up from the ground, not falling, caring for her husband, stairs, walking, getting up from sitting, bending, lifting, carrying, gardening, cooking, prolonged standing  PERSONAL FACTORS: Age, Fitness, Past/current experiences, Time since onset of injury/illness/exacerbation, and 3+ comorbidities:   plantar fasciitis, right foot pain (3 surgeries), osteopenia she has S/P total knee arthroplasty and Diverticulitis large intestine on their problem list. She  has a past medical history of Anemia, Anxiety, Arthritis, Cancer (HCC) (1999), GERD (gastroesophageal reflux disease), History of kidney stones, HOH (hard of hearing), Hypertension, Hypothyroidism, PONV (postoperative nausea and vomiting), and Tinnitus. She  has a past surgical history that includes Hemorroidectomy; Knee arthroscopy (Left); Liposuction; Blepharoplasty; Toe Surgery (Right); Varicose vein surgery; basil cell; Pelvic floor repair; Abdominal hysterectomy; Knee Arthroplasty (Left, 11/14/2016); Knee Arthroplasty (Right, 03/15/2017); total knee replacement (Bilateral, 11/2016); Cystocele repair (2005); Tonsillectomy; Shoulder arthroscopy with debridement and bicep tendon repair (Left, 09/24/2019); Cataract extraction w/PHACO (Left, 08/26/2020); and Cataract extraction w/PHACO (Right, 09/09/2020) are also affecting patient's functional outcome.   REHAB POTENTIAL: Good  CLINICAL DECISION MAKING: Evolving/moderate complexity  EVALUATION COMPLEXITY: Moderate   GOALS: Goals reviewed with patient? No  SHORT  TERM GOALS: Target date: 12/14/2022  Patient will be independent with initial home exercise program for self-management of symptoms. Baseline: Initial HEP provided at IE (11/30/22); Goal status: MET   LONG TERM GOALS: Target date: 02/22/2023. Target date updated to 05/18/2023  unmet goals on 02/23/2023.   Patient will be independent with a long-term home exercise program for self-management of symptoms.  Baseline: Initial HEP provided, longterm program still pending (01/11/23); Patient has been participating well (02/23/2023);  Goal status: In-progress  2.  Patient will demonstrate improved FOTO by equal or greater than 10 points by visit #10 to demonstrate improvement in overall condition and self-reported functional ability.  Baseline: 56 (11/30/22); 51 (pt denies feeling her balance has gotten worse, 01/11/23); 57 at visit #20 (02/23/2023);  Goal status: In-progress  3.  Patient will complete 5 Times Sit To Stand Test in equal or less than 15 seconds from 18.5 inch or less surface with no LOB or use of B UE to improve her ability to get around her home safely and carry items when getting up from a seated position.  Baseline: 23 seconds with frequent LOB backwards including leaning against plinth and one failed attempt. No use of B UE on the plinth. From 18.5 inch plinth (11/30/22); 17sec from 18.5" chair height hands free (01/11/23).  13 seconds from 18.5 inch plinth with no UE support (3rd set able to perform without UE or LOB. 02/23/2023);  Goal status: MET  4.  Patient will demonstrate the ability to complete the Floor Transfer Test in equal or less than 8.8 seconds to improve her ability to get up from the ground when gardening or after a fall.   Baseline: to be tested at a later date (11/30/22); unable to complete floor transfer test without two person assistance (12/13/2022); did not test, has not been a targeted intervention as of yet (01/11/23): 1:09 minutes/seconds with chair (02/23/2023);  Goal  status: In-progress  5.  Patient will complete community, work and/or recreational activities with 50% less limitation due to current condition.  Baseline: difficulty with kneeling, getting up from the ground, not falling, caring for her husband, stairs, walking, getting up from sitting, bending, lifting, carrying, gardening, cooking, prolonged standing (11/30/22); pt reports about the same, no big change here (01/11/23);  patient estimates 60% improvement (02/23/2023);  Goal status: MET  6.  Patient will score equal or greater than 25 on the Functional Gait Assessment to demonstrate low fall risk.  Baseline: to be tested at later date as appropriate (11/30/2022); FGA =19 (01/11/23); 20/30 (02/23/2023);  Goal status: In-progress   PLAN:  PT FREQUENCY: 1-2x/week  PT DURATION: 12 weeks  PLANNED INTERVENTIONS: Therapeutic exercises, Therapeutic activity, Neuromuscular re-education, Balance training, Gait training, Patient/Family education, Self Care, Joint mobilization, Stair training, DME instructions, Dry Needling, Cognitive remediation, Electrical stimulation, Spinal mobilization, Cryotherapy, Moist heat, Manual therapy, and Re-evaluation.  PLAN FOR NEXT SESSION: Update HEP as appropriate, progressive LE/core/functional strengthening and balance exercises. Education. Manual therapy as needed.   Vennie Waymire, [place], Student-PT   [place]. Ilsa Iha, PT, DPT 04/05/23, 12:13 PM  [facility] [address] [place], [place] [address] P: [phone] I F: [phone]

## 2023-04-11 DIAGNOSIS — K219 Gastro-esophageal reflux disease without esophagitis: Secondary | ICD-10-CM | POA: Diagnosis not present

## 2023-04-11 DIAGNOSIS — J9811 Atelectasis: Secondary | ICD-10-CM | POA: Diagnosis not present

## 2023-04-11 DIAGNOSIS — I517 Cardiomegaly: Secondary | ICD-10-CM | POA: Diagnosis not present

## 2023-04-11 DIAGNOSIS — R109 Unspecified abdominal pain: Secondary | ICD-10-CM | POA: Diagnosis not present

## 2023-04-11 DIAGNOSIS — I10 Essential (primary) hypertension: Secondary | ICD-10-CM | POA: Diagnosis not present

## 2023-04-11 DIAGNOSIS — R1084 Generalized abdominal pain: Secondary | ICD-10-CM | POA: Diagnosis not present

## 2023-04-11 DIAGNOSIS — I700 Atherosclerosis of aorta: Secondary | ICD-10-CM | POA: Diagnosis not present

## 2023-04-11 DIAGNOSIS — K5792 Diverticulitis of intestine, part unspecified, without perforation or abscess without bleeding: Secondary | ICD-10-CM | POA: Diagnosis not present

## 2023-04-11 DIAGNOSIS — N3941 Urge incontinence: Secondary | ICD-10-CM | POA: Diagnosis not present

## 2023-04-12 ENCOUNTER — Ambulatory Visit: Payer: HMO | Admitting: Physical Therapy

## 2023-04-14 ENCOUNTER — Other Ambulatory Visit: Payer: Self-pay | Admitting: Internal Medicine

## 2023-04-14 DIAGNOSIS — R1084 Generalized abdominal pain: Secondary | ICD-10-CM

## 2023-04-17 ENCOUNTER — Ambulatory Visit
Admission: RE | Admit: 2023-04-17 | Discharge: 2023-04-17 | Disposition: A | Discharge status: Home / Self Care | Payer: HMO | Source: Ambulatory Visit | Attending: Internal Medicine | Admitting: Internal Medicine

## 2023-04-17 DIAGNOSIS — R1084 Generalized abdominal pain: Secondary | ICD-10-CM | POA: Insufficient documentation

## 2023-04-17 DIAGNOSIS — N816 Rectocele: Secondary | ICD-10-CM | POA: Diagnosis not present

## 2023-04-17 DIAGNOSIS — R102 Pelvic and perineal pain: Secondary | ICD-10-CM | POA: Diagnosis not present

## 2023-04-17 DIAGNOSIS — N8110 Cystocele, unspecified: Secondary | ICD-10-CM | POA: Diagnosis not present

## 2023-04-25 ENCOUNTER — Ambulatory Visit: Payer: HMO | Attending: Physician Assistant | Admitting: Physical Therapy

## 2023-04-25 ENCOUNTER — Encounter: Payer: Self-pay | Admitting: Physical Therapy

## 2023-04-25 DIAGNOSIS — M6281 Muscle weakness (generalized): Secondary | ICD-10-CM | POA: Diagnosis not present

## 2023-04-25 DIAGNOSIS — M5459 Other low back pain: Secondary | ICD-10-CM | POA: Insufficient documentation

## 2023-04-25 DIAGNOSIS — M25561 Pain in right knee: Secondary | ICD-10-CM | POA: Diagnosis not present

## 2023-04-25 DIAGNOSIS — M25562 Pain in left knee: Secondary | ICD-10-CM | POA: Insufficient documentation

## 2023-04-25 DIAGNOSIS — Z9181 History of falling: Secondary | ICD-10-CM | POA: Insufficient documentation

## 2023-04-25 DIAGNOSIS — G8929 Other chronic pain: Secondary | ICD-10-CM | POA: Insufficient documentation

## 2023-04-25 DIAGNOSIS — R2689 Other abnormalities of gait and mobility: Secondary | ICD-10-CM | POA: Insufficient documentation

## 2023-04-25 NOTE — Therapy (Signed)
OUTPATIENT PHYSICAL THERAPY TREATMENT    Patient Name: Mary Elliott MRN: [id number] DOB:08/25/1940, 82 y.o., female Today's Date: 04/25/2023  END OF SESSION:  PT End of Session - 04/25/23 0906     Visit Number 19    Number of Visits 40    Date for PT Re-Evaluation 05/18/23    Authorization Type [employer] reporting period from 02/23/2023    Progress Note Due on Visit 20    PT Start Time 0902    PT Stop Time 0942    PT Time Calculation (min) 40 min    Equipment Utilized During Treatment --    Activity Tolerance Patient tolerated treatment well;Patient limited by fatigue;No increased pain    Behavior During Therapy WFL for tasks assessed/performed;Flat affect                 Past Medical History:  Diagnosis Date   Anemia    Anxiety    Arthritis    fingers   Cancer (HCC) 1999   basil cell on face   GERD (gastroesophageal reflux disease)    history of   History of kidney stones    twice   HOH (hard of hearing)    Hypertension    Hypothyroidism    PONV (postoperative nausea and vomiting)    nausea only with 2 prior surgeries   Tinnitus    Past Surgical History:  Procedure Laterality Date   ABDOMINAL HYSTERECTOMY     basil cell     face   BLEPHAROPLASTY     CATARACT EXTRACTION W/PHACO Left 08/26/2020   Procedure: CATARACT EXTRACTION PHACO AND INTRAOCULAR LENS PLACEMENT (IOC) LEFT PANOPTIX TORIC;  Surgeon: Lockie Mola, MD;  Location: [facility];  Service: Ophthalmology;  Laterality: Left;  8.88 1.:27.2 10.1%   CATARACT EXTRACTION W/PHACO Right 09/09/2020   Procedure: CATARACT EXTRACTION PHACO AND INTRAOCULAR LENS PLACEMENT (IOC) RIGHT PANOPTIX TORIC 8.54 01:03.3 13.5%;  Surgeon: Lockie Mola, MD;  Location: [facility];  Service: Ophthalmology;  Laterality: Right;   CYSTOCELE REPAIR  2005   HEMORROIDECTOMY     JOINT REPLACEMENT Bilateral 11/2016   KNEE ARTHROPLASTY Left 11/14/2016   Procedure: COMPUTER ASSISTED  TOTAL KNEE ARTHROPLASTY;  Surgeon: Donato Heinz, MD;  Location: [facility];  Service: Orthopedics;  Laterality: Left;   KNEE ARTHROPLASTY Right 03/15/2017   Procedure: COMPUTER ASSISTED TOTAL KNEE ARTHROPLASTY;  Surgeon: Donato Heinz, MD;  Location: [facility];  Service: Orthopedics;  Laterality: Right;   KNEE ARTHROSCOPY Left    LIPOSUCTION     PELVIC FLOOR REPAIR     SHOULDER ARTHROSCOPY WITH DEBRIDEMENT AND BICEP TENDON REPAIR Left 09/24/2019   Procedure: Left shoulder arthroscopy with debridement, decompression, rotator cuff repair, and possible biceps tenodesis.;  Surgeon: Christena Flake, MD;  Location: [facility];  Service: Orthopedics;  Laterality: Left;   TOE SURGERY Right    TONSILLECTOMY     VARICOSE VEIN SURGERY     Patient Active Problem List   Diagnosis Date Noted   Diverticulitis large intestine 05/08/2022   S/P total knee arthroplasty 11/14/2016    PCP: Lynnea Ferrier, MD  REFERRING PROVIDER: Dayton Bailiff, PA  REFERRING DIAG: right groin pain, acute bilateral low back pain without sciatica, left hip pain, weakness of both legs  Rationale for Evaluation and Treatment: Rehabilitation  THERAPY DIAG:  Muscle weakness (generalized)  Chronic pain of both knees  Other abnormalities of gait and mobility  Other low back pain  History of falling  ONSET  DATE: possibly chronic but current exacerbation started approximately January 2024  PERTINENT HISTORY:  Patient is a 82 y.o. female who presents to outpatient physical therapy with a referral for medical diagnosis right groin pain, acute bilateral low back pain without sciatica, left hip pain, weakness of both legs. This patient's chief complaints consist of difficulty with mobility, unsteadiness on her feet, falls, low back pain with prolonged standing, and anterior knee pain with kneeling, leading to the following functional deficits: difficulty with kneeling, getting up from the ground, not falling, caring for her  husband, stairs, walking, getting up from sitting, bending, lifting, carrying, gardening, cooking, prolonged standing. Relevant past medical history and comorbidities include plantar fasciitis, right foot pain (3 surgeries), osteopenia she has S/P total knee arthroplasty and Diverticulitis large intestine on their problem list. She  has a past medical history of Anemia, Anxiety, Arthritis, Cancer (HCC) (1999), GERD (gastroesophageal reflux disease), History of kidney stones, HOH (hard of hearing), Hypertension, Hypothyroidism, PONV (postoperative nausea and vomiting), and Tinnitus. She  has a past surgical history that includes Hemorroidectomy; Knee arthroscopy (Left); Liposuction; Blepharoplasty; Toe Surgery (Right); Varicose vein surgery; basil cell; Pelvic floor repair; Abdominal hysterectomy; Knee Arthroplasty (Left, 11/14/2016); Knee Arthroplasty (Right, 03/15/2017); total knee replacement (Bilateral, 11/2016); Cystocele repair (2005); Tonsillectomy; Shoulder arthroscopy with debridement and bicep tendon repair (Left, 09/24/2019); Cataract extraction w/PHACO (Left, 08/26/2020); and Cataract extraction w/PHACO (Right, 09/09/2020). Patient denies hx of stroke, seizures, lung problems, heart problems, diabetes, unexplained weight loss, unexplained changes in bowel or bladder problems, and spinal surgery  SUBJECTIVE:                                                                                                                                                                                           SUBJECTIVE STATEMENT: Patient states her right shoulder is continuing to hurt at the lateral upper arm. She states it is painful when she reaches up. Since her last PT session she had a lot of pain in her abdomen since last PT session when she did wall sits. She states she had a lot of pain in her bilateral lower abdominal region just proximal to her groin while doing the wall sits last PT session. She states she wished  she had refused to do this exercise because it caused pain there that did not go away. She states she saw her doctor about it. He did bloodwork and a pelvic xray that did not show anything. She also had a pelvic CT but is still awaiting the results. She states the pain has slowly gone away. She has been carrying a cane just to be safe. She did  catch the front of her shoe on the linoleum and fell in her bedroom. After several attempts she was able to get up. She states it feels like the front of her feet all of a sudden was glued to the floor. She has a lot of bruises and may have made her shoulder worse. She crawled around on the floor and rolled until she got to something she could hold onto. She states she had her last appointment with for the pelvic surgery study she participated in and they did an internal exam and said everything looked fine. She states she almost called to cancel all of her PT appointments due to how bad she felt after last PT session.   PAIN:  NPRS: 1-2/10 right lateral arm when she reaches overhead or forwards.   PRECAUTIONS: Fall  PATIENT GOALS: "stay on my feet" "be able to get up out of a chair" improve her core strength  OBJECTIVE    TODAY'S TREATMENT:     Therapeutic exercise: to centralize symptoms and improve ROM, strength, muscular endurance, and activity tolerance required for successful completion of functional activities.   NuStep level 4 using bilateral upper and lower extremities. Seat/handle setting 9/12. For improved extremity mobility, muscular endurance, and activity tolerance; and to induce the analgesic effect of aerobic exercise, stimulate improved joint nutrition, and prepare body structures and systems for following interventions. x 8  minutes. Average SPM = 47.  Standing BAPS board level 3: SLS or modified SLS with R foot on board attempting to perform PF/DF with B UE support. After many attempts with difficulty avoiding compensations and getting  rhythm, discontinued due to lack of optimal stimulus. Patient probably completed at least 40 taps during this time but had minimal ankle control.   Attempted standing PF/DF with R SLS on flat surface of bosu with B UE support, but abandoned after about 10 reps of trying due to overcompensation with hips and inability to correct effectively.   Single leg heel raises on leg press: 2x20 each side with 15#, additional reps completed double leg stance at 25# and with single leg 15# to try to find appropriate weight and foot position. Seat position adjusted as far away as possible. Required physical assistance to place foot in correct position and heavy cuing to slow tempo down to get full ROM (patient tends to try to do many fast, ineffective movements). Patient fatigued by 20 reps with sufficient form at 15#.   Leg press, 2x20 at 45/55#, double leg stance with seat at level 1. No pain.   Pt required multimodal cuing for proper technique and to facilitate improved neuromuscular control, strength, range of motion, and functional ability resulting in improved performance and form.  PATIENT EDUCATION:  Education details: Exercise purpose/form. Self management techniques. Education on diagnosis, prognosis, POC, anatomy and physiology of current condition. Education on HEP including handout. Person educated: Patient Education method: Explanation, Demonstration, Tactile cues, Verbal cues, and Handouts Education comprehension: verbalized understanding, returned demonstration, and needs further education  HOME EXERCISE PROGRAM: Access Code: [email or web address] URL: [email or web address] Date: 02/15/2023 Prepared by: Norton Blizzard  Exercises - Squat with Chair Touch  - 3-5 x weekly - 3 sets - 10 reps - Standing March with Unilateral Counter Support  - 3-5 x weekly - 3 sets - 10 reps - Standing Knee Flexion with Unilateral Counter Support  - 3-5 x weekly - 3 sets - 10 reps - Standing Hip Abduction with  Unilateral Counter Support  - 3-5 x weekly -  3 sets - 10 reps - Heel Toe Raises with Unilateral Counter Support  - 3-5 x weekly - 3 sets - 10 reps  HOME EXERCISE PROGRAM [[email or web address]] View at "[email or web address]" using code: [email or web address] Half Kneel on Padded Chair  -  Repeat 10 Repetitions, Hold 5 Seconds, Complete 2 Sets, Perform 1 Times a Day  HOME EXERCISE PROGRAM [[email or web address]] View at "[email or web address]" using code: [email or web address] DYNAMIC SINGLE LIMB STANCE - CONE KNOCK DOWN AND SET UP -  Repeat 10 Repetitions, Hold 1 Second(s), Complete 1 Set, Perform 1 Times a Day  Drawing for single leg toe taps, 3x10 each side 3 times a week  ASSESSMENT:  CLINICAL IMPRESSION:  Patient arrives with a SPC, reporting a fall a day ago and that she almost did not return after last PT session because of increased lower abdominal pian (that she had with wall sits during session). Today's session focused on trying to achieve strengthening and motor control for the ankles and strength for B LE, which were goals during last PT session, but with modifications for improved comfort and success. Patient's ankle exercises were regressed due to inability to effectively complete BAPS board exercises, apparently due to weakness/poor muscular endurance and she was regressed to exercise dosing focused more on muscular endurance with the leg press. Patient continues to require extended processing time and shows difficulty with motor control in ankle exercises. Plan to continue with exercises to address these deficits which are likely contributing to her fall risk, imbalance, and difficulty with functional mobility. Addressing these will help her work towards her goals of staying on her feet and being able to get out of a chair. Patient would benefit from continued management of limiting condition by skilled physical therapist to address remaining impairments and functional limitations to work towards stated goals and return to PLOF or maximal  functional independence.    OBJECTIVE IMPAIRMENTS: Abnormal gait, decreased activity tolerance, decreased balance, decreased endurance, decreased knowledge of condition, decreased knowledge of use of DME, decreased mobility, difficulty walking, decreased ROM, decreased strength, hypomobility, increased edema, impaired perceived functional ability, impaired flexibility, obesity, and pain.   ACTIVITY LIMITATIONS: carrying, lifting, bending, standing, squatting, stairs, transfers, bed mobility, dressing, locomotion level, and caring for others  PARTICIPATION LIMITATIONS: meal prep, cleaning, laundry, interpersonal relationship, shopping, community activity, yard work, and   difficulty with kneeling, getting up from the ground, not falling, caring for her husband, stairs, walking, getting up from sitting, bending, lifting, carrying, gardening, cooking, prolonged standing  PERSONAL FACTORS: Age, Fitness, Past/current experiences, Time since onset of injury/illness/exacerbation, and 3+ comorbidities:   plantar fasciitis, right foot pain (3 surgeries), osteopenia she has S/P total knee arthroplasty and Diverticulitis large intestine on their problem list. She  has a past medical history of Anemia, Anxiety, Arthritis, Cancer (HCC) (1999), GERD (gastroesophageal reflux disease), History of kidney stones, HOH (hard of hearing), Hypertension, Hypothyroidism, PONV (postoperative nausea and vomiting), and Tinnitus. She  has a past surgical history that includes Hemorroidectomy; Knee arthroscopy (Left); Liposuction; Blepharoplasty; Toe Surgery (Right); Varicose vein surgery; basil cell; Pelvic floor repair; Abdominal hysterectomy; Knee Arthroplasty (Left, 11/14/2016); Knee Arthroplasty (Right, 03/15/2017); total knee replacement (Bilateral, 11/2016); Cystocele repair (2005); Tonsillectomy; Shoulder arthroscopy with debridement and bicep tendon repair (Left, 09/24/2019); Cataract extraction w/PHACO (Left, 08/26/2020); and  Cataract extraction w/PHACO (Right, 09/09/2020) are also affecting patient's functional outcome.   REHAB POTENTIAL: Good  CLINICAL DECISION MAKING: Evolving/moderate complexity  EVALUATION COMPLEXITY: Moderate   GOALS: Goals reviewed with patient? No  SHORT TERM GOALS: Target  date: 12/14/2022  Patient will be independent with initial home exercise program for self-management of symptoms. Baseline: Initial HEP provided at IE (11/30/22); Goal status: MET   LONG TERM GOALS: Target date: 02/22/2023. Target date updated to 05/18/2023  unmet goals on 02/23/2023.   Patient will be independent with a long-term home exercise program for self-management of symptoms.  Baseline: Initial HEP provided, longterm program still pending (01/11/23); Patient has been participating well (02/23/2023);  Goal status: In-progress  2.  Patient will demonstrate improved FOTO by equal or greater than 10 points by visit #10 to demonstrate improvement in overall condition and self-reported functional ability.  Baseline: 56 (11/30/22); 51 (pt denies feeling her balance has gotten worse, 01/11/23); 57 at visit #20 (02/23/2023);  Goal status: In-progress  3.  Patient will complete 5 Times Sit To Stand Test in equal or less than 15 seconds from 18.5 inch or less surface with no LOB or use of B UE to improve her ability to get around her home safely and carry items when getting up from a seated position.  Baseline: 23 seconds with frequent LOB backwards including leaning against plinth and one failed attempt. No use of B UE on the plinth. From 18.5 inch plinth (11/30/22); 17sec from 18.5" chair height hands free (01/11/23).  13 seconds from 18.5 inch plinth with no UE support (3rd set able to perform without UE or LOB. 02/23/2023);  Goal status: MET  4.  Patient will demonstrate the ability to complete the Floor Transfer Test in equal or less than 8.8 seconds to improve her ability to get up from the ground when gardening or  after a fall.   Baseline: to be tested at a later date (11/30/22); unable to complete floor transfer test without two person assistance (12/13/2022); did not test, has not been a targeted intervention as of yet (01/11/23): 1:09 minutes/seconds with chair (02/23/2023);  Goal status: In-progress  5.  Patient will complete community, work and/or recreational activities with 50% less limitation due to current condition.  Baseline: difficulty with kneeling, getting up from the ground, not falling, caring for her husband, stairs, walking, getting up from sitting, bending, lifting, carrying, gardening, cooking, prolonged standing (11/30/22); pt reports about the same, no big change here (01/11/23);  patient estimates 60% improvement (02/23/2023);  Goal status: MET  6.  Patient will score equal or greater than 25 on the Functional Gait Assessment to demonstrate low fall risk.  Baseline: to be tested at later date as appropriate (11/30/2022); FGA =19 (01/11/23); 20/30 (02/23/2023);  Goal status: In-progress   PLAN:  PT FREQUENCY: 1-2x/week  PT DURATION: 12 weeks  PLANNED INTERVENTIONS: Therapeutic exercises, Therapeutic activity, Neuromuscular re-education, Balance training, Gait training, Patient/Family education, Self Care, Joint mobilization, Stair training, DME instructions, Dry Needling, Cognitive remediation, Electrical stimulation, Spinal mobilization, Cryotherapy, Moist heat, Manual therapy, and Re-evaluation.  PLAN FOR NEXT SESSION: Update HEP as appropriate, progressive LE/core/functional strengthening and balance exercises. Education. Manual therapy as needed.   Luretha Murphy. Ilsa Iha, PT, DPT 04/25/23, 11:52 AM  [facility] [address] [place], [place] [address] P: [phone] I F: [phone]

## 2023-04-27 ENCOUNTER — Encounter: Payer: Self-pay | Admitting: Physical Therapy

## 2023-04-27 ENCOUNTER — Ambulatory Visit: Payer: HMO | Admitting: Physical Therapy

## 2023-04-27 DIAGNOSIS — Z9181 History of falling: Secondary | ICD-10-CM

## 2023-04-27 DIAGNOSIS — R2689 Other abnormalities of gait and mobility: Secondary | ICD-10-CM

## 2023-04-27 DIAGNOSIS — M5459 Other low back pain: Secondary | ICD-10-CM

## 2023-04-27 DIAGNOSIS — G8929 Other chronic pain: Secondary | ICD-10-CM

## 2023-04-27 DIAGNOSIS — M6281 Muscle weakness (generalized): Secondary | ICD-10-CM

## 2023-04-27 NOTE — Therapy (Signed)
OUTPATIENT PHYSICAL THERAPY TREATMENT / PROGRESS NOTE Dates of reporting from 02/23/2023 to 04/27/2023    Patient Name: Mary Elliott MRN: [id number] DOB:[birth year], 82 y.o., female Today's Date: 04/27/2023  END OF SESSION:  PT End of Session - 04/27/23 0911     Visit Number 20    Number of Visits 40    Date for PT Re-Evaluation 05/18/23    Authorization Type [employer] reporting period from 02/23/2023    Progress Note Due on Visit 20    PT Start Time 0903    PT Stop Time 0941    PT Time Calculation (min) 38 min    Activity Tolerance Patient tolerated treatment well;Patient limited by fatigue;No increased pain    Behavior During Therapy WFL for tasks assessed/performed;Flat affect               Past Medical History:  Diagnosis Date   Anemia    Anxiety    Arthritis    fingers   Cancer (HCC) 1999   basil cell on face   GERD (gastroesophageal reflux disease)    history of   History of kidney stones    twice   HOH (hard of hearing)    Hypertension    Hypothyroidism    PONV (postoperative nausea and vomiting)    nausea only with 2 prior surgeries   Tinnitus    Past Surgical History:  Procedure Laterality Date   ABDOMINAL HYSTERECTOMY     basil cell     face   BLEPHAROPLASTY     CATARACT EXTRACTION W/PHACO Left 08/26/2020   Procedure: CATARACT EXTRACTION PHACO AND INTRAOCULAR LENS PLACEMENT (IOC) LEFT PANOPTIX TORIC;  Surgeon: Lockie Mola, MD;  Location: [facility];  Service: Ophthalmology;  Laterality: Left;  8.88 1.:27.2 10.1%   CATARACT EXTRACTION W/PHACO Right 09/09/2020   Procedure: CATARACT EXTRACTION PHACO AND INTRAOCULAR LENS PLACEMENT (IOC) RIGHT PANOPTIX TORIC 8.54 01:03.3 13.5%;  Surgeon: Lockie Mola, MD;  Location: [facility];  Service: Ophthalmology;  Laterality: Right;   CYSTOCELE REPAIR  2005   HEMORROIDECTOMY     JOINT REPLACEMENT Bilateral 11/2016   KNEE ARTHROPLASTY Left 11/14/2016   Procedure:  COMPUTER ASSISTED TOTAL KNEE ARTHROPLASTY;  Surgeon: Donato Heinz, MD;  Location: [facility];  Service: Orthopedics;  Laterality: Left;   KNEE ARTHROPLASTY Right 03/15/2017   Procedure: COMPUTER ASSISTED TOTAL KNEE ARTHROPLASTY;  Surgeon: Donato Heinz, MD;  Location: [facility];  Service: Orthopedics;  Laterality: Right;   KNEE ARTHROSCOPY Left    LIPOSUCTION     PELVIC FLOOR REPAIR     SHOULDER ARTHROSCOPY WITH DEBRIDEMENT AND BICEP TENDON REPAIR Left 09/24/2019   Procedure: Left shoulder arthroscopy with debridement, decompression, rotator cuff repair, and possible biceps tenodesis.;  Surgeon: Christena Flake, MD;  Location: [facility];  Service: Orthopedics;  Laterality: Left;   TOE SURGERY Right    TONSILLECTOMY     VARICOSE VEIN SURGERY     Patient Active Problem List   Diagnosis Date Noted   Diverticulitis large intestine 05/08/2022   S/P total knee arthroplasty 11/14/2016    PCP: Lynnea Ferrier, MD  REFERRING PROVIDER: Dayton Bailiff, PA  REFERRING DIAG: right groin pain, acute bilateral low back pain without sciatica, left hip pain, weakness of both legs  Rationale for Evaluation and Treatment: Rehabilitation  THERAPY DIAG:  Muscle weakness (generalized)  Chronic pain of both knees  Other abnormalities of gait and mobility  Other low back pain  History of falling  ONSET  DATE: possibly chronic but current exacerbation started approximately January 2024  PERTINENT HISTORY:  Patient is a 82 y.o. female who presents to outpatient physical therapy with a referral for medical diagnosis right groin pain, acute bilateral low back pain without sciatica, left hip pain, weakness of both legs. This patient's chief complaints consist of difficulty with mobility, unsteadiness on her feet, falls, low back pain with prolonged standing, and anterior knee pain with kneeling, leading to the following functional deficits: difficulty with kneeling, getting up from the ground, not  falling, caring for her husband, stairs, walking, getting up from sitting, bending, lifting, carrying, gardening, cooking, prolonged standing. Relevant past medical history and comorbidities include plantar fasciitis, right foot pain (3 surgeries), osteopenia she has S/P total knee arthroplasty and Diverticulitis large intestine on their problem list. She  has a past medical history of Anemia, Anxiety, Arthritis, Cancer (HCC) (1999), GERD (gastroesophageal reflux disease), History of kidney stones, HOH (hard of hearing), Hypertension, Hypothyroidism, PONV (postoperative nausea and vomiting), and Tinnitus. She  has a past surgical history that includes Hemorroidectomy; Knee arthroscopy (Left); Liposuction; Blepharoplasty; Toe Surgery (Right); Varicose vein surgery; basil cell; Pelvic floor repair; Abdominal hysterectomy; Knee Arthroplasty (Left, 11/14/2016); Knee Arthroplasty (Right, 03/15/2017); total knee replacement (Bilateral, 11/2016); Cystocele repair (2005); Tonsillectomy; Shoulder arthroscopy with debridement and bicep tendon repair (Left, 09/24/2019); Cataract extraction w/PHACO (Left, 08/26/2020); and Cataract extraction w/PHACO (Right, 09/09/2020). Patient denies hx of stroke, seizures, lung problems, heart problems, diabetes, unexplained weight loss, unexplained changes in bowel or bladder problems, and spinal surgery  SUBJECTIVE:                                                                                                                                                                                           SUBJECTIVE STATEMENT: Patient arrives without her cane stating that she continues to use it or carry it usually for security.  PAIN:  NPRS: 1-2/10 right lateral arm when she reaches overhead or forwards.   PRECAUTIONS: Fall  PATIENT GOALS: "stay on my feet" "be able to get up out of a chair" improve her core strength  OBJECTIVE  SELF-REPORTED FUNCTION FOTO score: 52/100 (balance  questionnaire)   FUNCTIONAL/BALANCE TESTS 10 Meter Walk Test: 0.91 m/s   5 Time Sit To Stand Test (best of 3 trials): 16 seconds from 18.5 inch plinth with no UE support (LOB backwards at least 1 rep on 1st and 3rd trials, used elbows on proximal thighs 2 reps on 2nd trial - quickest).   Floor Transfer Test: deferred due to R shoulder pain   [facility] PT Assessment - 04/27/23 0001  Functional Gait  Assessment   Gait assessed  Yes   no assistive device   Gait Level Surface Walks 20 ft in less than 5.5 sec, no assistive devices, good speed, no evidence for imbalance, normal gait pattern, deviates no more than 6 in outside of the 12 in walkway width.   self selected: 7.75 seconds; fast: 6.59 seconds   Change in Gait Speed Able to smoothly change walking speed without loss of balance or gait deviation. Deviate no more than 6 in outside of the 12 in walkway width.    Gait with Horizontal Head Turns Performs head turns smoothly with no change in gait. Deviates no more than 6 in outside 12 in walkway width    Gait with Vertical Head Turns Performs head turns with no change in gait. Deviates no more than 6 in outside 12 in walkway width.    Gait and Pivot Turn Pivot turns safely within 3 sec and stops quickly with no loss of balance.    Step Over Obstacle Is able to step over one shoe box (4.5 in total height) without changing gait speed. No evidence of imbalance.    Gait with Narrow Base of Support Ambulates less than 4 steps heel to toe or cannot perform without assistance.    Gait with Eyes Closed Walks 20 ft, slow speed, abnormal gait pattern, evidence for imbalance, deviates 10-15 in outside 12 in walkway width. Requires more than 9 sec to ambulate 20 ft.   9.25 seconds   Ambulating Backwards Walks 20 ft, uses assistive device, slower speed, mild gait deviations, deviates 6-10 in outside 12 in walkway width.   16.87 seconds   Steps Alternating feet, must use rail.    Total Score 22    FGA  comment: 19-24 = medium risk fall              TODAY'S TREATMENT:     Therapeutic exercise: to centralize symptoms and improve ROM, strength, muscular endurance, and activity tolerance required for successful completion of functional activities.   NuStep level 4 using bilateral upper and lower extremities (discontinued use of R UE due to shoulder pain). Seat/handle setting 9/12. For improved extremity mobility, muscular endurance, and activity tolerance; and to induce the analgesic effect of aerobic exercise, stimulate improved joint nutrition, and prepare body structures and systems for following interventions. x 8  minutes. Average SPM = 44.  Education on POC and progress.   Neuromuscular Re-education: to improve, balance, postural strength, muscle activation patterns, and stabilization strength required for functional activities:  Functional Gait Assessment testing to assess progress in balance and fall risk (see above).  Therapeutic activities: dynamic activities for functional strengthening and improved functional activity tolerance.  5 Times Sit To Stand Test x 3 to assess progress (see above).   Pt required multimodal cuing for proper technique and to facilitate improved neuromuscular control, strength, range of motion, and functional ability resulting in improved performance and form.  PATIENT EDUCATION:  Education details: Exercise purpose/form. Self management techniques. Education on diagnosis, prognosis, POC, anatomy and physiology of current condition.  Person educated: Patient Education method: Explanation, Demonstration, Tactile cues, Verbal cues, and Handouts Education comprehension: verbalized understanding, returned demonstration, and needs further education  HOME EXERCISE PROGRAM: Access Code: [email or web address] URL: [email or web address] Date: 02/15/2023 Prepared by: Norton Blizzard  Exercises - Squat with Chair Touch  - 3-5 x weekly - 3 sets - 10 reps -  Standing March with Unilateral Counter Support  - 3-5 x weekly - 3  sets - 10 reps - Standing Knee Flexion with Unilateral Counter Support  - 3-5 x weekly - 3 sets - 10 reps - Standing Hip Abduction with Unilateral Counter Support  - 3-5 x weekly - 3 sets - 10 reps - Heel Toe Raises with Unilateral Counter Support  - 3-5 x weekly - 3 sets - 10 reps  HOME EXERCISE PROGRAM [[email or web address]] View at "[email or web address]" using code: [email or web address] Half Kneel on Padded Chair  -  Repeat 10 Repetitions, Hold 5 Seconds, Complete 2 Sets, Perform 1 Times a Day  HOME EXERCISE PROGRAM [2GFUELM] View at "[email or web address]" using code: [email or web address] DYNAMIC SINGLE LIMB STANCE - CONE KNOCK DOWN AND SET UP -  Repeat 10 Repetitions, Hold 1 Second(s), Complete 1 Set, Perform 1 Times a Day  Drawing for single leg toe taps, 3x10 each side 3 times a week  ASSESSMENT:  CLINICAL IMPRESSION:  Patient has attended 20 physical therapy sessions since starting current episode of care on 11/30/2022. She initially made some progress towards her goals but at this point is showing a plateau in progress overall. Therefore, plan to complete 2 more visits to finalize long term HEP to help with self-management. She continues to have impairments and functional limitations that leave her with moderate fall risk and difficulty performing her valued daily activities. Plan to discharge in 2 visits due to lack of progress with continued PT.    OBJECTIVE IMPAIRMENTS: Abnormal gait, decreased activity tolerance, decreased balance, decreased endurance, decreased knowledge of condition, decreased knowledge of use of DME, decreased mobility, difficulty walking, decreased ROM, decreased strength, hypomobility, increased edema, impaired perceived functional ability, impaired flexibility, obesity, and pain.   ACTIVITY LIMITATIONS: carrying, lifting, bending, standing, squatting, stairs, transfers, bed mobility, dressing, locomotion level, and caring for  others  PARTICIPATION LIMITATIONS: meal prep, cleaning, laundry, interpersonal relationship, shopping, community activity, yard work, and   difficulty with kneeling, getting up from the ground, not falling, caring for her husband, stairs, walking, getting up from sitting, bending, lifting, carrying, gardening, cooking, prolonged standing  PERSONAL FACTORS: Age, Fitness, Past/current experiences, Time since onset of injury/illness/exacerbation, and 3+ comorbidities:   plantar fasciitis, right foot pain (3 surgeries), osteopenia she has S/P total knee arthroplasty and Diverticulitis large intestine on their problem list. She  has a past medical history of Anemia, Anxiety, Arthritis, Cancer (HCC) (1999), GERD (gastroesophageal reflux disease), History of kidney stones, HOH (hard of hearing), Hypertension, Hypothyroidism, PONV (postoperative nausea and vomiting), and Tinnitus. She  has a past surgical history that includes Hemorroidectomy; Knee arthroscopy (Left); Liposuction; Blepharoplasty; Toe Surgery (Right); Varicose vein surgery; basil cell; Pelvic floor repair; Abdominal hysterectomy; Knee Arthroplasty (Left, 11/14/2016); Knee Arthroplasty (Right, 03/15/2017); total knee replacement (Bilateral, 11/2016); Cystocele repair (2005); Tonsillectomy; Shoulder arthroscopy with debridement and bicep tendon repair (Left, 09/24/2019); Cataract extraction w/PHACO (Left, 08/26/2020); and Cataract extraction w/PHACO (Right, 09/09/2020) are also affecting patient's functional outcome.   REHAB POTENTIAL: Good  CLINICAL DECISION MAKING: Evolving/moderate complexity  EVALUATION COMPLEXITY: Moderate   GOALS: Goals reviewed with patient? No  SHORT TERM GOALS: Target date: 12/14/2022  Patient will be independent with initial home exercise program for self-management of symptoms. Baseline: Initial HEP provided at IE (11/30/22); Goal status: MET   LONG TERM GOALS: Target date: 02/22/2023. Target date updated to 05/18/2023   unmet goals on 02/23/2023.   Patient will be independent with a long-term home exercise program for self-management of symptoms.  Baseline: Initial HEP provided, longterm program still pending (01/11/23); Patient has been participating well (02/23/2023); participating but  not as much as she thinks she should (04/27/2023);  Goal status: In-progress  2.  Patient will demonstrate improved FOTO by equal or greater than 10 points by visit #10 to demonstrate improvement in overall condition and self-reported functional ability.  Baseline: 56 (11/30/22); 51 (pt denies feeling her balance has gotten worse, 01/11/23); 57 at visit #20 (02/23/2023); 52 at visit #20 (04/27/2023);  Goal status: In-progress  3.  Patient will complete 5 Times Sit To Stand Test in equal or less than 15 seconds from 18.5 inch or less surface with no LOB or use of B UE to improve her ability to get around her home safely and carry items when getting up from a seated position.  Baseline: 23 seconds with frequent LOB backwards including leaning against plinth and one failed attempt. No use of B UE on the plinth. From 18.5 inch plinth (11/30/22); 17sec from 18.5" chair height hands free (01/11/23).  13 seconds from 18.5 inch plinth with no UE support (3rd set able to perform without UE or LOB. 02/23/2023); 16 seconds from 18.5 inch plinth with no UE support (LOB backwards at least 1 rep on 1st and 3rd trials, used elbows on proximal thighs 2 reps on 2nd trial - quickest, 04/27/2023);  Goal status: MET on 02/23/2023, regressed 04/27/2023   4.  Patient will demonstrate the ability to complete the Floor Transfer Test in equal or less than 8.8 seconds to improve her ability to get up from the ground when gardening or after a fall.   Baseline: to be tested at a later date (11/30/22); unable to complete floor transfer test without two person assistance (12/13/2022); did not test, has not been a targeted intervention as of yet (01/11/23): 1:09  minutes/seconds with chair (02/23/2023); deferred due to shoulder pain (04/27/2023);  Goal status: ongoing   5.  Patient will complete community, work and/or recreational activities with 50% less limitation due to current condition.  Baseline: difficulty with kneeling, getting up from the ground, not falling, caring for her husband, stairs, walking, getting up from sitting, bending, lifting, carrying, gardening, cooking, prolonged standing (11/30/22); pt reports about the same, no big change here (01/11/23);  patient estimates 60% improvement (02/23/2023); estimates 50% improvement since starting PT (04/27/2023);  Goal status: MET  6.  Patient will score equal or greater than 25 on the Functional Gait Assessment to demonstrate low fall risk.  Baseline: to be tested at later date as appropriate (11/30/2022); FGA =19 (01/11/23); 20/30 (02/23/2023); 22/30 at visit 20 (04/27/2023);  Goal status: In-progress   PLAN:  PT FREQUENCY: 1-2x/week  PT DURATION: 12 weeks  PLANNED INTERVENTIONS: Therapeutic exercises, Therapeutic activity, Neuromuscular re-education, Balance training, Gait training, Patient/Family education, Self Care, Joint mobilization, Stair training, DME instructions, Dry Needling, Cognitive remediation, Electrical stimulation, Spinal mobilization, Cryotherapy, Moist heat, Manual therapy, and Re-evaluation.  PLAN FOR NEXT SESSION: Update long term HEP as appropriate for discharge.    Luretha Murphy. Ilsa Iha, PT, DPT 04/27/23, 12:02 PM  [facility] [address] [place], [place] [address] P: [phone] I F: [phone]

## 2023-05-01 ENCOUNTER — Ambulatory Visit: Payer: HMO | Admitting: Physical Therapy

## 2023-05-09 ENCOUNTER — Ambulatory Visit: Payer: HMO | Admitting: Physical Therapy

## 2023-05-11 ENCOUNTER — Encounter: Payer: Self-pay | Admitting: Physical Therapy

## 2023-05-11 ENCOUNTER — Ambulatory Visit: Payer: HMO | Attending: Physician Assistant | Admitting: Physical Therapy

## 2023-05-11 DIAGNOSIS — R2689 Other abnormalities of gait and mobility: Secondary | ICD-10-CM | POA: Insufficient documentation

## 2023-05-11 DIAGNOSIS — Z9181 History of falling: Secondary | ICD-10-CM | POA: Insufficient documentation

## 2023-05-11 DIAGNOSIS — M6281 Muscle weakness (generalized): Secondary | ICD-10-CM | POA: Diagnosis not present

## 2023-05-11 DIAGNOSIS — M5459 Other low back pain: Secondary | ICD-10-CM | POA: Insufficient documentation

## 2023-05-11 DIAGNOSIS — M25562 Pain in left knee: Secondary | ICD-10-CM | POA: Insufficient documentation

## 2023-05-11 DIAGNOSIS — M25561 Pain in right knee: Secondary | ICD-10-CM | POA: Insufficient documentation

## 2023-05-11 DIAGNOSIS — G8929 Other chronic pain: Secondary | ICD-10-CM | POA: Diagnosis not present

## 2023-05-11 NOTE — Therapy (Signed)
 OUTPATIENT PHYSICAL THERAPY TREATMENT  / DISCHARGE SUMMARY Dates of reporting from 11/30/2022 to 05/11/2023    Patient Name: Mary Elliott MRN: [id number] DOB:1941/05/06, 83 y.o., female Today's Date: 05/11/2023  END OF SESSION:  PT End of Session - 05/11/23 1947     Visit Number 21    Number of Visits 40    Date for PT Re-Evaluation 05/18/23    Authorization Type [employer] reporting period from 02/23/2023    Progress Note Due on Visit 30    PT Start Time 1647    PT Stop Time 1730    PT Time Calculation (min) 43 min    Activity Tolerance Patient tolerated treatment well;No increased pain    Behavior During Therapy WFL for tasks assessed/performed             Past Medical History:  Diagnosis Date   Anemia    Anxiety    Arthritis    fingers   Cancer (HCC) 1999   basil cell on face   GERD (gastroesophageal reflux disease)    history of   History of kidney stones    twice   HOH (hard of hearing)    Hypertension    Hypothyroidism    PONV (postoperative nausea and vomiting)    nausea only with 2 prior surgeries   Tinnitus    Past Surgical History:  Procedure Laterality Date   ABDOMINAL HYSTERECTOMY     basil cell     face   BLEPHAROPLASTY     CATARACT EXTRACTION W/PHACO Left 08/26/2020   Procedure: CATARACT EXTRACTION PHACO AND INTRAOCULAR LENS PLACEMENT (IOC) LEFT PANOPTIX TORIC;  Surgeon: Mittie Gaskin, MD;  Location: [facility];  Service: Ophthalmology;  Laterality: Left;  8.88 1.:27.2 10.1%   CATARACT EXTRACTION W/PHACO Right 09/09/2020   Procedure: CATARACT EXTRACTION PHACO AND INTRAOCULAR LENS PLACEMENT (IOC) RIGHT PANOPTIX TORIC 8.54 01:03.3 13.5%;  Surgeon: Mittie Gaskin, MD;  Location: [facility];  Service: Ophthalmology;  Laterality: Right;   CYSTOCELE REPAIR  2005   HEMORROIDECTOMY     JOINT REPLACEMENT Bilateral 11/2016   KNEE ARTHROPLASTY Left 11/14/2016   Procedure: COMPUTER ASSISTED TOTAL KNEE ARTHROPLASTY;   Surgeon: Mardee Lynwood SQUIBB, MD;  Location: [facility];  Service: Orthopedics;  Laterality: Left;   KNEE ARTHROPLASTY Right 03/15/2017   Procedure: COMPUTER ASSISTED TOTAL KNEE ARTHROPLASTY;  Surgeon: Mardee Lynwood SQUIBB, MD;  Location: [facility];  Service: Orthopedics;  Laterality: Right;   KNEE ARTHROSCOPY Left    LIPOSUCTION     PELVIC FLOOR REPAIR     SHOULDER ARTHROSCOPY WITH DEBRIDEMENT AND BICEP TENDON REPAIR Left 09/24/2019   Procedure: Left shoulder arthroscopy with debridement, decompression, rotator cuff repair, and possible biceps tenodesis.;  Surgeon: Edie Norleen PARAS, MD;  Location: [facility];  Service: Orthopedics;  Laterality: Left;   TOE SURGERY Right    TONSILLECTOMY     VARICOSE VEIN SURGERY     Patient Active Problem List   Diagnosis Date Noted   Diverticulitis large intestine 05/08/2022   S/P total knee arthroplasty 11/14/2016    PCP: Fernande Ophelia PARAS DOUGLAS, MD  REFERRING PROVIDER: Lamar Norleen Manus, PA  REFERRING DIAG: right groin pain, acute bilateral low back pain without sciatica, left hip pain, weakness of both legs  Rationale for Evaluation and Treatment: Rehabilitation  THERAPY DIAG:  Muscle weakness (generalized)  Chronic pain of both knees  Other abnormalities of gait and mobility  Other low back pain  History of falling  ONSET DATE: possibly chronic but current  exacerbation started approximately January 2024  PERTINENT HISTORY:  Patient is a 83 y.o. female who presents to outpatient physical therapy with a referral for medical diagnosis right groin pain, acute bilateral low back pain without sciatica, left hip pain, weakness of both legs. This patient's chief complaints consist of difficulty with mobility, unsteadiness on her feet, falls, low back pain with prolonged standing, and anterior knee pain with kneeling, leading to the following functional deficits: difficulty with kneeling, getting up from the ground, not falling, caring for her husband, stairs, walking,  getting up from sitting, bending, lifting, carrying, gardening, cooking, prolonged standing. Relevant past medical history and comorbidities include plantar fasciitis, right foot pain (3 surgeries), osteopenia she has S/P total knee arthroplasty and Diverticulitis large intestine on their problem list. She  has a past medical history of Anemia, Anxiety, Arthritis, Cancer (HCC) (1999), GERD (gastroesophageal reflux disease), History of kidney stones, HOH (hard of hearing), Hypertension, Hypothyroidism, PONV (postoperative nausea and vomiting), and Tinnitus. She  has a past surgical history that includes Hemorroidectomy; Knee arthroscopy (Left); Liposuction; Blepharoplasty; Toe Surgery (Right); Varicose vein surgery; basil cell; Pelvic floor repair; Abdominal hysterectomy; Knee Arthroplasty (Left, 11/14/2016); Knee Arthroplasty (Right, 03/15/2017); total knee replacement (Bilateral, 11/2016); Cystocele repair (2005); Tonsillectomy; Shoulder arthroscopy with debridement and bicep tendon repair (Left, 09/24/2019); Cataract extraction w/PHACO (Left, 08/26/2020); and Cataract extraction w/PHACO (Right, 09/09/2020). Patient denies hx of stroke, seizures, lung problems, heart problems, diabetes, unexplained weight loss, unexplained changes in bowel or bladder problems, and spinal surgery  SUBJECTIVE:                                                                                                                                                                                           SUBJECTIVE STATEMENT: Patient arrives without her cane. She states her husband is in the hospital and is waiting to go to a rehab facility when the insurance authorized it. She states he fell because he got some hard liquor. Patient states she plans to prioritize herself and take care of herself. She states her right arm is feeling okay now. She has not been doing any of her HEP intentionally. She does try to stand up from chairs using her legs  and focusing on walking. She has been using her SPC on and off.   PAIN:  NPRS: 0/10  PRECAUTIONS: Fall  PATIENT GOALS: stay on my feet be able to get up out of a chair improve her core strength  OBJECTIVE   TODAY'S TREATMENT:     Therapeutic exercise: to centralize symptoms and improve ROM, strength, muscular endurance, and activity tolerance required for successful completion of functional  activities.   Circuit:  Standing marching with intermittent B UE support on TM bar: 2x10 each side Cuing to hold for 1 second, shift weight, and for UE use Standing AROM hamstring curls with intermittant B UE support on TM bar: 2x10 each side Cuing to hold for 1 second, shift weight, and for UE use Standing AROM hip abduction with B UE support on TM Bar: 2x20 each side Cuing for upright posture, B UE support on TM bar, 1 second hold Heel raises from floor with B UE support on TM bar: 2x20 Poor heel clearance, especially last 4 reps  Education on HEP including handout Education on community exercise options at [employer] and senior center (provided handouts)  Neuromuscular Re-education: to improve, balance, postural strength, muscle activation patterns, and stabilization strength required for functional activities:  Cone tip/right 1x20 each foot  Therapeutic activities: dynamic activities for functional strengthening and improved functional activity tolerance.  Squat to buttocks tap on chair with airex pad on it, 2x20  Pt required multimodal cuing for proper technique and to facilitate improved neuromuscular control, strength, range of motion, and functional ability resulting in improved performance and form.  PATIENT EDUCATION:  Education details: Exercise purpose/form. Self management techniques. Education on diagnosis, prognosis, POC, anatomy and physiology of current condition. Discharge recommendations.  Person educated: Patient Education method: Explanation, Demonstration,  Tactile cues, Verbal cues, and Handouts Education comprehension: verbalized understanding, returned demonstration, and needs further education  HOME EXERCISE PROGRAM: Access Code: [email or web address] URL: [email or web address] Date: 05/11/2023 Prepared by: Camie Cleverly  Exercises - Standing March with Unilateral Counter Support  - 4 x weekly - 2 sets - 20 reps - 1 second hold - Standing Knee Flexion with Unilateral Counter Support  - 4 x weekly - 3 sets - 20 reps - 1 second hold - Standing Hip Abduction with Unilateral Counter Support  - 4 x weekly - 3 sets - 20 reps - 1 second hold - Heel Toe Raises with Unilateral Counter Support  - 4 x weekly - 3 sets - 20 reps - 1 second hold - Squat with Chair Touch  - 4 x weekly - 3 sets - 20 reps  HOME EXERCISE PROGRAM [[id number]] View at [email or web address] using code: [email or web address] DYNAMIC SINGLE LIMB STANCE - CONE KNOCK DOWN AND SET UP -  Repeat 20 Repetitions, Complete 3 Sets, Perform 3 Times a Week  ASSESSMENT:  CLINICAL IMPRESSION:  Patient has attended 21 physical therapy sessions since starting current episode of care on 11/30/2022. She initially made some progress towards her goals but at this point is showing a plateau in progress overall. Today's session was focused on updating and reviewing HEP to discharge to long term HEP. She demonstrated good understanding of exercises and was in agreement with discharge today. She continues to have impairments and functional limitations that leave her with moderate fall risk and difficulty performing her valued daily activities. She is now discharged to long term HEP with recommendation to participate in community fitness programs as able.    OBJECTIVE IMPAIRMENTS: Abnormal gait, decreased activity tolerance, decreased balance, decreased endurance, decreased knowledge of condition, decreased knowledge of use of DME, decreased mobility, difficulty walking, decreased ROM, decreased strength, hypomobility,  increased edema, impaired perceived functional ability, impaired flexibility, obesity, and pain.   ACTIVITY LIMITATIONS: carrying, lifting, bending, standing, squatting, stairs, transfers, bed mobility, dressing, locomotion level, and caring for others  PARTICIPATION LIMITATIONS: meal prep, cleaning, laundry, interpersonal relationship, shopping, community activity, yard work, and   difficulty with kneeling,  getting up from the ground, not falling, caring for her husband, stairs, walking, getting up from sitting, bending, lifting, carrying, gardening, cooking, prolonged standing  PERSONAL FACTORS: Age, Fitness, Past/current experiences, Time since onset of injury/illness/exacerbation, and 3+ comorbidities:   plantar fasciitis, right foot pain (3 surgeries), osteopenia she has S/P total knee arthroplasty and Diverticulitis large intestine on their problem list. She  has a past medical history of Anemia, Anxiety, Arthritis, Cancer (HCC) (1999), GERD (gastroesophageal reflux disease), History of kidney stones, HOH (hard of hearing), Hypertension, Hypothyroidism, PONV (postoperative nausea and vomiting), and Tinnitus. She  has a past surgical history that includes Hemorroidectomy; Knee arthroscopy (Left); Liposuction; Blepharoplasty; Toe Surgery (Right); Varicose vein surgery; basil cell; Pelvic floor repair; Abdominal hysterectomy; Knee Arthroplasty (Left, 11/14/2016); Knee Arthroplasty (Right, 03/15/2017); total knee replacement (Bilateral, 11/2016); Cystocele repair (2005); Tonsillectomy; Shoulder arthroscopy with debridement and bicep tendon repair (Left, 09/24/2019); Cataract extraction w/PHACO (Left, 08/26/2020); and Cataract extraction w/PHACO (Right, 09/09/2020) are also affecting patient's functional outcome.   REHAB POTENTIAL: Good  CLINICAL DECISION MAKING: Evolving/moderate complexity  EVALUATION COMPLEXITY: Moderate   GOALS: Goals reviewed with patient? No  SHORT TERM GOALS: Target date:  12/14/2022  Patient will be independent with initial home exercise program for self-management of symptoms. Baseline: Initial HEP provided at IE (11/30/22); Goal status: MET   LONG TERM GOALS: Target date: 02/22/2023. Target date updated to 05/18/2023  unmet goals on 02/23/2023.   Patient will be independent with a long-term home exercise program for self-management of symptoms.  Baseline: Initial HEP provided, longterm program still pending (01/11/23); Patient has been participating well (02/23/2023); participating but not as much as she thinks she should (04/27/2023);  Goal status: MET  2.  Patient will demonstrate improved FOTO by equal or greater than 10 points by visit #10 to demonstrate improvement in overall condition and self-reported functional ability.  Baseline: 56 (11/30/22); 51 (pt denies feeling her balance has gotten worse, 01/11/23); 57 at visit #20 (02/23/2023); 52 at visit #20 (04/27/2023);  Goal status: In-progress  3.  Patient will complete 5 Times Sit To Stand Test in equal or less than 15 seconds from 18.5 inch or less surface with no LOB or use of B UE to improve her ability to get around her home safely and carry items when getting up from a seated position.  Baseline: 23 seconds with frequent LOB backwards including leaning against plinth and one failed attempt. No use of B UE on the plinth. From 18.5 inch plinth (11/30/22); 17sec from 18.5 chair height hands free (01/11/23).  13 seconds from 18.5 inch plinth with no UE support (3rd set able to perform without UE or LOB. 02/23/2023); 16 seconds from 18.5 inch plinth with no UE support (LOB backwards at least 1 rep on 1st and 3rd trials, used elbows on proximal thighs 2 reps on 2nd trial - quickest, 04/27/2023);  Goal status: MET on 02/23/2023, regressed 04/27/2023   4.  Patient will demonstrate the ability to complete the Floor Transfer Test in equal or less than 8.8 seconds to improve her ability to get up from the ground when  gardening or after a fall.   Baseline: to be tested at a later date (11/30/22); unable to complete floor transfer test without two person assistance (12/13/2022); did not test, has not been a targeted intervention as of yet (01/11/23): 1:09 minutes/seconds with chair (02/23/2023); deferred due to shoulder pain (04/27/2023);  Goal status: NOT MET  5.  Patient will complete community, work and/or recreational activities  with 50% less limitation due to current condition.  Baseline: difficulty with kneeling, getting up from the ground, not falling, caring for her husband, stairs, walking, getting up from sitting, bending, lifting, carrying, gardening, cooking, prolonged standing (11/30/22); pt reports about the same, no big change here (01/11/23);  patient estimates 60% improvement (02/23/2023); estimates 50% improvement since starting PT (04/27/2023);  Goal status: MET  6.  Patient will score equal or greater than 25 on the Functional Gait Assessment to demonstrate low fall risk.  Baseline: to be tested at later date as appropriate (11/30/2022); FGA =19 (01/11/23); 20/30 (02/23/2023); 22/30 at visit 20 (04/27/2023);  Goal status: Partially MET   PLAN:  PT FREQUENCY: 1-2x/week  PT DURATION: 12 weeks  PLANNED INTERVENTIONS: Therapeutic exercises, Therapeutic activity, Neuromuscular re-education, Balance training, Gait training, Patient/Family education, Self Care, Joint mobilization, Stair training, DME instructions, Dry Needling, Cognitive remediation, Electrical stimulation, Spinal mobilization, Cryotherapy, Moist heat, Manual therapy, and Re-evaluation.  PLAN FOR NEXT SESSION: Patient is now discharged from PT  Amaury Kuzel R. Juli, PT, DPT 05/11/23, 7:56 PM  [facility] [address] [place], [place] [address] P: [phone] I F: [phone]

## 2023-05-15 ENCOUNTER — Ambulatory Visit: Payer: HMO | Admitting: Physical Therapy

## 2023-05-16 DIAGNOSIS — L03011 Cellulitis of right finger: Secondary | ICD-10-CM | POA: Diagnosis not present

## 2023-05-16 DIAGNOSIS — M13841 Other specified arthritis, right hand: Secondary | ICD-10-CM | POA: Diagnosis not present

## 2023-05-16 DIAGNOSIS — M19041 Primary osteoarthritis, right hand: Secondary | ICD-10-CM | POA: Insufficient documentation

## 2023-05-17 ENCOUNTER — Encounter: Payer: HMO | Admitting: Physical Therapy

## 2023-05-19 DIAGNOSIS — L600 Ingrowing nail: Secondary | ICD-10-CM | POA: Diagnosis not present

## 2023-05-19 DIAGNOSIS — M13841 Other specified arthritis, right hand: Secondary | ICD-10-CM | POA: Diagnosis not present

## 2023-05-19 DIAGNOSIS — L03011 Cellulitis of right finger: Secondary | ICD-10-CM | POA: Diagnosis not present

## 2023-05-19 DIAGNOSIS — L859 Epidermal thickening, unspecified: Secondary | ICD-10-CM | POA: Diagnosis not present

## 2023-05-19 DIAGNOSIS — M25741 Osteophyte, right hand: Secondary | ICD-10-CM | POA: Diagnosis not present

## 2023-05-19 DIAGNOSIS — M19041 Primary osteoarthritis, right hand: Secondary | ICD-10-CM | POA: Diagnosis not present

## 2023-05-22 ENCOUNTER — Encounter: Payer: HMO | Admitting: Physical Therapy

## 2023-05-24 ENCOUNTER — Encounter: Payer: HMO | Admitting: Physical Therapy

## 2023-05-25 DIAGNOSIS — L03011 Cellulitis of right finger: Secondary | ICD-10-CM | POA: Diagnosis not present

## 2023-05-29 DIAGNOSIS — F411 Generalized anxiety disorder: Secondary | ICD-10-CM | POA: Diagnosis not present

## 2023-05-29 DIAGNOSIS — F410 Panic disorder [episodic paroxysmal anxiety] without agoraphobia: Secondary | ICD-10-CM | POA: Diagnosis not present

## 2023-05-29 DIAGNOSIS — F3342 Major depressive disorder, recurrent, in full remission: Secondary | ICD-10-CM | POA: Diagnosis not present

## 2023-06-02 DIAGNOSIS — H26492 Other secondary cataract, left eye: Secondary | ICD-10-CM | POA: Diagnosis not present

## 2023-06-02 DIAGNOSIS — Z961 Presence of intraocular lens: Secondary | ICD-10-CM | POA: Diagnosis not present

## 2023-06-02 DIAGNOSIS — H04123 Dry eye syndrome of bilateral lacrimal glands: Secondary | ICD-10-CM | POA: Diagnosis not present

## 2023-06-02 DIAGNOSIS — H43813 Vitreous degeneration, bilateral: Secondary | ICD-10-CM | POA: Diagnosis not present

## 2023-06-05 ENCOUNTER — Ambulatory Visit: Payer: Self-pay | Admitting: Urology

## 2023-06-08 ENCOUNTER — Emergency Department
Admission: EM | Admit: 2023-06-08 | Discharge: 2023-06-08 | Disposition: A | Discharge status: Home / Self Care | Payer: HMO | Attending: Emergency Medicine | Admitting: Emergency Medicine

## 2023-06-08 ENCOUNTER — Other Ambulatory Visit: Payer: Self-pay

## 2023-06-08 DIAGNOSIS — S01511A Laceration without foreign body of lip, initial encounter: Secondary | ICD-10-CM | POA: Insufficient documentation

## 2023-06-08 DIAGNOSIS — S0993XA Unspecified injury of face, initial encounter: Secondary | ICD-10-CM | POA: Diagnosis present

## 2023-06-08 DIAGNOSIS — W19XXXA Unspecified fall, initial encounter: Secondary | ICD-10-CM

## 2023-06-08 DIAGNOSIS — W1830XA Fall on same level, unspecified, initial encounter: Secondary | ICD-10-CM | POA: Diagnosis not present

## 2023-06-08 MED ORDER — ACETAMINOPHEN 500 MG PO TABS
1000.0000 mg | ORAL_TABLET | Freq: Once | ORAL | Status: AC
  Administered 2023-06-08: 1000 mg via ORAL
  Filled 2023-06-08: qty 2

## 2023-06-08 MED ORDER — LIDOCAINE-EPINEPHRINE (PF) 2 %-1:200000 IJ SOLN
INTRAMUSCULAR | Status: AC
  Filled 2023-06-08: qty 20

## 2023-06-08 MED ORDER — LIDOCAINE-EPINEPHRINE 1 %-1:100000 IJ SOLN: 20.0000 mL | Freq: Once | INTRAMUSCULAR | Status: DC

## 2023-06-08 MED ORDER — LIDOCAINE HCL (PF) 1 % IJ SOLN
5.0000 mL | Freq: Once | INTRAMUSCULAR | Status: AC
  Administered 2023-06-08: 5 mL
  Filled 2023-06-08: qty 5

## 2023-06-08 NOTE — ED Provider Triage Note (Signed)
Emergency Medicine Provider Triage Evaluation Note  Mary Elliott , a 83 y.o. female  was evaluated in triage.  Pt complains of fall this morning with laceration to lip and dental injury.  No LOC or head injury.  Continues to ambulate without problems.    Review of Systems  Positive: Lip lac.  Negative: No head injury  Physical Exam  BP (!) 163/102 (BP Location: Left Arm)   Pulse (!) 53   Temp 97.8 F (36.6 C) (Oral)   Resp 16   Ht 5\' 1"  (1.549 m)   Wt 83.9 kg   SpO2 98%   BMI 34.96 kg/m  Gen:   Awake, no distress  Alert, talkative Resp:  Normal effort  MSK:   Moves extremities without difficulty  Other:  Laceration left upper lip with active bleeding.   Medical Decision Making  Medically screening exam initiated at 9:37 AM.  Appropriate orders placed.  Mary Elliott was informed that the remainder of the evaluation will be completed by another provider, this initial triage assessment does not replace that evaluation, and the importance of remaining in the ED until their evaluation is complete.     Tommi Rumps, PA-C 06/08/23 [phone]

## 2023-06-08 NOTE — Discharge Instructions (Signed)
Follow-up with your primary care physician so they can reevaluate your wound and remove your sutures at day 5-7.  Return to the emergency department for any worsening symptoms or signs of bleeding or signs of an infection.  Thank you for choosing [place] for your health care, it was my pleasure to care for you today!  Corena Herter, MD

## 2023-06-08 NOTE — ED Provider Notes (Signed)
[facility] Provider Note    Event Date/Time   First MD Initiated Contact with Patient 06/08/23 1140     (approximate)   History   Fall   HPI  Mary Elliott is a 83 y.o. female presents to the emergency department following a fall.  Patient states that her shoe got stuck causing her to fall forward hitting her face.  States that her tooth hit her upper lip causing it to be cut.  Denies any other head injury or loss of consciousness.  Not on anticoagulation.  Tetanus is up-to-date.     Physical Exam   Triage Vital Signs: ED Triage Vitals  Encounter Vitals Group     BP 06/08/23 0935 (!) 163/102     Systolic BP Percentile --      Diastolic BP Percentile --      Pulse Rate 06/08/23 0935 (!) 53     Resp 06/08/23 0935 16     Temp 06/08/23 0935 97.8 F (36.6 C)     Temp Source 06/08/23 0935 Oral     SpO2 06/08/23 0935 98 %     Weight 06/08/23 0936 185 lb (83.9 kg)     Height 06/08/23 0936 5\' 1"  (1.549 m)     Head Circumference --      Peak Flow --      Pain Score 06/08/23 0936 3     Pain Loc --      Pain Education --      Exclude from Growth Chart --     Most recent vital signs: Vitals:   06/08/23 0935  BP: (!) 163/102  Pulse: (!) 53  Resp: 16  Temp: 97.8 F (36.6 C)  SpO2: 98%    Physical Exam HENT:     Head:     Comments: Upper lip laceration does not cross the vermilion border.  No septal hematoma.  No tenderness to the nasal bridge.  No facial bone tenderness.    Right Ear: External ear normal.     Left Ear: External ear normal.  Eyes:     Extraocular Movements: Extraocular movements intact.     Pupils: Pupils are equal, round, and reactive to light.  Cardiovascular:     Rate and Rhythm: Normal rate.  Pulmonary:     Effort: No respiratory distress.  Abdominal:     General: There is no distension.  Musculoskeletal:        General: No tenderness. Normal range of motion.     Cervical back: No tenderness.  Neurological:      General: No focal deficit present.     Mental Status: She is alert.      IMPRESSION / MDM / ASSESSMENT AND PLAN / ED COURSE  I reviewed the triage vital signs and the nursing notes.  Differential diagnosis including lip laceration, head injury, facial fracture  Labs (all labs ordered are listed, but only abnormal results are displayed) Labs interpreted as -    Labs Reviewed - No data to display    Do not feel that the patient needs a CT scan of her head or cervical spine.  GCS of 15.  No facial instability or tenderness to palpation and no septal hematoma.  Do not feel that the patient needs a CT scan of her face.  Lip laceration to the upper lip.  Lip laceration was repaired at bedside.  Discussed close follow-up with primary care physician and return precautions for any ongoing or worsening symptoms.  PROCEDURES:  Critical Care performed: No  .Laceration Repair  Date/Time: 06/08/2023 3:33 PM  Performed by: Corena Herter, MD Authorized by: Corena Herter, MD   Consent:    Consent obtained:  Verbal   Consent given by:  Patient   Risks, benefits, and alternatives were discussed: yes     Risks discussed:  Pain, nerve damage, poor wound healing, poor cosmetic result, vascular damage, tendon damage, infection and need for additional repair   Alternatives discussed:  Delayed treatment and no treatment Universal protocol:    Procedure explained and questions answered to patient or proxy's satisfaction: yes     Relevant documents present and verified: yes     Test results available: yes     Imaging studies available: yes     Required blood products, implants, devices, and special equipment available: yes     Patient identity confirmed:  Verbally with patient Anesthesia:    Anesthesia method:  Local infiltration   Local anesthetic:  Lidocaine 1% w/o epi and lidocaine 1% WITH epi Laceration details:    Location:  Lip   Lip location:  Upper exterior lip and upper interior  lip   Length (cm):  2 Pre-procedure details:    Preparation:  Patient was prepped and draped in usual sterile fashion Treatment:    Area cleansed with:  Povidone-iodine   Amount of cleaning:  Standard Skin repair:    Repair method:  Sutures   Suture size:  6-0   Suture material:  Prolene   Suture technique:  Simple interrupted   Number of sutures:  5 Approximation:    Approximation:  Close   Vermilion border well-aligned: yes   Repair type:    Repair type:  Intermediate Post-procedure details:    Procedure completion:  Tolerated well, no immediate complications Comments:     Superficial arterial bleed, pressure and lidocaine with epi resolved bleeding.   Patient's presentation is most consistent with acute complicated illness / injury requiring diagnostic workup.   MEDICATIONS ORDERED IN ED: Medications  lidocaine-EPINEPHrine (XYLOCAINE W/EPI) 1 %-1:100000 (with pres) injection 20 mL (has no administration in time range)  acetaminophen (TYLENOL) tablet 1,000 mg (1,000 mg Oral Given 06/08/23 1331)  lidocaine (PF) (XYLOCAINE) 1 % injection 5 mL (5 mLs Other Given 06/08/23 1451)  lidocaine-EPINEPHrine (XYLOCAINE W/EPI) 2 %-1:200000 (PF) injection (  Given 06/08/23 1451)    FINAL CLINICAL IMPRESSION(S) / ED DIAGNOSES   Final diagnoses:  Fall, initial encounter  Lip laceration, initial encounter     Rx / DC Orders   ED Discharge Orders     None        Note:  This document was prepared using Dragon voice recognition software and may include unintentional dictation errors.   Corena Herter, MD 06/08/23 1535

## 2023-06-08 NOTE — ED Triage Notes (Addendum)
Pt ambulatory to triage without difficulty, carrying her cane, pt states that she fell this am, pt reports falling around 0803, states that her shoes stopped her and she fell forward and hit the floor, states that her tooth cut her lip, denies head or neck pain  On a course of cipro for finger surg

## 2023-06-13 DIAGNOSIS — L03011 Cellulitis of right finger: Secondary | ICD-10-CM | POA: Diagnosis not present

## 2023-06-19 DIAGNOSIS — S01511D Laceration without foreign body of lip, subsequent encounter: Secondary | ICD-10-CM | POA: Diagnosis not present

## 2023-06-19 DIAGNOSIS — I10 Essential (primary) hypertension: Secondary | ICD-10-CM | POA: Diagnosis not present

## 2023-06-19 DIAGNOSIS — F3341 Major depressive disorder, recurrent, in partial remission: Secondary | ICD-10-CM | POA: Diagnosis not present

## 2023-07-24 ENCOUNTER — Ambulatory Visit: Payer: Self-pay | Admitting: Urology

## 2023-07-24 ENCOUNTER — Encounter: Payer: Self-pay | Admitting: Urology

## 2023-07-24 VITALS — BP 158/93 | HR 60 | Ht 61.0 in | Wt 185.0 lb

## 2023-07-24 DIAGNOSIS — N3941 Urge incontinence: Secondary | ICD-10-CM | POA: Diagnosis not present

## 2023-07-24 LAB — URINALYSIS, COMPLETE
Bilirubin, UA: NEGATIVE
Glucose, UA: NEGATIVE
Ketones, UA: NEGATIVE
Leukocytes,UA: NEGATIVE
Nitrite, UA: NEGATIVE
Protein,UA: NEGATIVE
RBC, UA: NEGATIVE
Specific Gravity, UA: 1.01 (ref 1.005–1.030)
Urobilinogen, Ur: 0.2 mg/dL (ref 0.2–1.0)
pH, UA: 6 (ref 5.0–7.5)

## 2023-07-24 LAB — MICROSCOPIC EXAMINATION: Bacteria, UA: NONE SEEN

## 2023-07-24 MED ORDER — GEMTESA 75 MG PO TABS: 75.0000 mg | ORAL_TABLET | Freq: Every day | ORAL | 11 refills | Status: DC

## 2023-07-24 NOTE — Patient Instructions (Signed)

## 2023-07-24 NOTE — Progress Notes (Signed)
 07/24/2023 8:45 AM   Mary Elliott 10/13/1940 [id number]  Referring provider: Lynnea Ferrier, MD [address] [facility] [place],  [place] [address]  Chief Complaint  Patient presents with   Urinary Incontinence    HPI: I was consulted to assess the patient is urgency incontinence.  She soaks 6 pads a day.  She denies bedwetting and stress incontinence.  She voids every 1 or 2 hours and sometimes gets up once at night  She describes a cystocele repair and hysterectomy 2 years ago.  She was in a research study.  She says she has a rectocele.  She states she got headaches with Vesicare and try the oxybutynin patch  She has alternating diarrhea and constipation.  She has had a kidney stone.  She denies recurrent bladder infections.  No neurologic issues   PMH: Past Medical History:  Diagnosis Date   Anemia    Anxiety    Arthritis    fingers   Cancer (HCC) 1999   basil cell on face   GERD (gastroesophageal reflux disease)    history of   History of kidney stones    twice   HOH (hard of hearing)    Hypertension    Hypothyroidism    PONV (postoperative nausea and vomiting)    nausea only with 2 prior surgeries   Tinnitus     Surgical History: Past Surgical History:  Procedure Laterality Date   ABDOMINAL HYSTERECTOMY     basil cell     face   BLEPHAROPLASTY     CATARACT EXTRACTION W/PHACO Left 08/26/2020   Procedure: CATARACT EXTRACTION PHACO AND INTRAOCULAR LENS PLACEMENT (IOC) LEFT PANOPTIX TORIC;  Surgeon: Lockie Mola, MD;  Location: [facility];  Service: Ophthalmology;  Laterality: Left;  8.88 1.:27.2 10.1%   CATARACT EXTRACTION W/PHACO Right 09/09/2020   Procedure: CATARACT EXTRACTION PHACO AND INTRAOCULAR LENS PLACEMENT (IOC) RIGHT PANOPTIX TORIC 8.54 01:03.3 13.5%;  Surgeon: Lockie Mola, MD;  Location: [facility];  Service: Ophthalmology;  Laterality: Right;   CYSTOCELE REPAIR  2005   HEMORROIDECTOMY      JOINT REPLACEMENT Bilateral 11/2016   KNEE ARTHROPLASTY Left 11/14/2016   Procedure: COMPUTER ASSISTED TOTAL KNEE ARTHROPLASTY;  Surgeon: Donato Heinz, MD;  Location: [facility];  Service: Orthopedics;  Laterality: Left;   KNEE ARTHROPLASTY Right 03/15/2017   Procedure: COMPUTER ASSISTED TOTAL KNEE ARTHROPLASTY;  Surgeon: Donato Heinz, MD;  Location: [facility];  Service: Orthopedics;  Laterality: Right;   KNEE ARTHROSCOPY Left    LIPOSUCTION     PELVIC FLOOR REPAIR     SHOULDER ARTHROSCOPY WITH DEBRIDEMENT AND BICEP TENDON REPAIR Left 09/24/2019   Procedure: Left shoulder arthroscopy with debridement, decompression, rotator cuff repair, and possible biceps tenodesis.;  Surgeon: Christena Flake, MD;  Location: [facility];  Service: Orthopedics;  Laterality: Left;   TOE SURGERY Right    TONSILLECTOMY     VARICOSE VEIN SURGERY      Home Medications:  Allergies as of 07/24/2023       Reactions   Clindamycin Itching   Clindamycin/lincomycin Hives   Nitrofurantoin Hives   Ciprofloxacin Other (See Comments)   Confusion, myalgia   Nickel Itching   Amlodipine Other (See Comments)   edema   Amoxicillin Itching   Duloxetine Other (See Comments)   Unable to empty bladder At a high dose of med   Nsaids Other (See Comments)   Prior HX of ulcers   Oxybutynin Hives, Rash, Other (See  Comments)   The patches cause a reaction.   Sulfa Antibiotics Other (See Comments), Diarrhea   GI UPSET Felt cloudy and couldn't move   Tegaserod Other (See Comments)   Joint pain   Ultram [tramadol] Itching   Vesicare [solifenacin Succinate] Other (See Comments)   headache        Medication List        Accurate as of July 24, 2023  8:45 AM. If you have any questions, ask your nurse or doctor.          STOP taking these medications    Glucosamine-Chondroitin 500-400 MG Caps Stopped by: Lorin Picket A Kayal Mula   pantoprazole 20 MG tablet Commonly known as: Protonix Stopped by: Lorin Picket A Grizelda Piscopo    sucralfate 1 GM/10ML suspension Commonly known as: Carafate Stopped by: Lorin Picket A Tobin Witucki       TAKE these medications    cyanocobalamin 1000 MCG tablet Commonly known as: VITAMIN B12 Take 1,000 mcg by mouth daily.   estradiol 0.1 MG/GM vaginal cream Commonly known as: ESTRACE Place 1 Applicatorful vaginally 2 (two) times a week.   fluticasone 50 MCG/ACT nasal spray Commonly known as: FLONASE Place 2 sprays into both nostrils daily as needed for allergies or rhinitis.   levothyroxine 100 MCG tablet Commonly known as: SYNTHROID Take 100 mcg by mouth daily before breakfast.   loratadine 10 MG tablet Commonly known as: CLARITIN Take 10 mg by mouth daily as needed for allergies.   metoprolol succinate 100 MG 24 hr tablet Commonly known as: TOPROL-XL Take 100 mg by mouth daily.   metroNIDAZOLE 0.75 % cream Commonly known as: METROCREAM Apply 1 application topically 2 (two) times daily as needed (rosacea.).   PROBIOTIC PO Take 1 capsule by mouth daily after lunch.   sertraline 100 MG tablet Commonly known as: ZOLOFT Take 100 mg by mouth daily.   Vitamin D3 50 MCG (2000 UT) capsule Take 2,000 Units by mouth daily.        Allergies:  Allergies  Allergen Reactions   Clindamycin Itching   Clindamycin/Lincomycin Hives   Nitrofurantoin Hives   Ciprofloxacin Other (See Comments)    Confusion, myalgia   Nickel Itching   Amlodipine Other (See Comments)    edema   Amoxicillin Itching   Duloxetine Other (See Comments)    Unable to empty bladder At a high dose of med   Nsaids Other (See Comments)    Prior HX of ulcers   Oxybutynin Hives, Rash and Other (See Comments)    The patches cause a reaction.   Sulfa Antibiotics Other (See Comments) and Diarrhea    GI UPSET Felt cloudy and couldn't move   Tegaserod Other (See Comments)    Joint pain   Ultram [Tramadol] Itching   Vesicare [Solifenacin Succinate] Other (See Comments)    headache    Family  History: Family History  Problem Relation Age of Onset   Breast cancer Neg Hx     Social History:  reports that she quit smoking about 55 years ago. Her smoking use included cigarettes. She has never used smokeless tobacco. She reports current alcohol use of about 4.0 standard drinks of alcohol per week. She reports that she does not use drugs.  ROS:                                        Physical Exam: There were no vitals  taken for this visit.  Constitutional:  Alert and oriented, No acute distress. HEENT: [place] AT, moist mucus membranes.  Trachea midline, no masses. Cardiovascular: No clubbing, cyanosis, or edema. Respiratory: Normal respiratory effort, no increased work of breathing. GI: Abdomen is soft, nontender, nondistended, no abdominal masses GU: On pelvic examination patient had a small grade 2 cystocele that hinged over the urethra.  She has significant loss of vaginal length of approximately 7 cm at the send it to approximately 4 cm.  She had some vaginal narrowing.  She had minimal rectocele with the anterior wall and apex reduce.  She had no stress incontinence. Skin: No rashes, bruises or suspicious lesions. Lymph: No cervical or inguinal adenopathy. Neurologic: Grossly intact, no focal deficits, moving all 4 extremities. Psychiatric: Normal mood and affect.  Laboratory Data: Lab Results  Component Value Date   WBC 10.1 05/10/2022   HGB 12.0 05/10/2022   HCT 36.8 05/10/2022   MCV 93.4 05/10/2022   PLT 280 05/10/2022    Lab Results  Component Value Date   CREATININE 0.80 05/10/2022    No results found for: "PSA"  No results found for: "TESTOSTERONE"  No results found for: "HGBA1C"  Urinalysis    Component Value Date/Time   COLORURINE YELLOW (A) 05/08/2022 1112   APPEARANCEUR CLEAR (A) 05/08/2022 1112   LABSPEC 1.011 05/08/2022 1112   PHURINE 5.0 05/08/2022 1112   GLUCOSEU NEGATIVE 05/08/2022 1112   HGBUR NEGATIVE 05/08/2022 1112    BILIRUBINUR NEGATIVE 05/08/2022 1112   KETONESUR 5 (A) 05/08/2022 1112   PROTEINUR 30 (A) 05/08/2022 1112   NITRITE NEGATIVE 05/08/2022 1112   LEUKOCYTESUR TRACE (A) 05/08/2022 1112    Pertinent Imaging: Urine reviewed.  Urine sent for culture.  Chart reviewed  Assessment & Plan: Patient has high-volume urge incontinence soaking several pads a day.  She has had previous bladder surgery.  She has frequency.  She has cystocele with loss of vaginal cuff support.  She feels a little bit of bulging but she said it is much better than it was years ago picture drawn.  I would like to have her come back for cystoscopy on Gemtesa and proceed accordingly.  She understands that we may need to order urodynamics in the future.  She generally does not do that well with medication but she understands side effects are low  1. Urge incontinence (Primary)  - Urinalysis, Complete   No follow-ups on file.  Martina Sinner, MD  [facility] [address] [place], [place] [address] [phone]

## 2023-07-26 LAB — CULTURE, URINE COMPREHENSIVE

## 2023-07-28 ENCOUNTER — Telehealth: Payer: Self-pay

## 2023-07-28 NOTE — Telephone Encounter (Signed)
 Patient was seen on 07/24/2023 and was started on Gemtesa, after 2 days she noticed improvement already in her incontinence and frequency to urinate. By yesterday she started to feel that maybe medication is working too well. She felt more of a lower abdominal fullness and not urinating as much in frequency and urine amount output,  and was worried that she was retaining urine. She feels better today and has urinated over night and this morning. She has stopped taking Gemtesa as of today and will wait for our feedback.

## 2023-07-31 MED ORDER — MIRABEGRON ER 25 MG PO TB24: 25.0000 mg | ORAL_TABLET | Freq: Every day | ORAL | 11 refills | Status: AC

## 2023-07-31 NOTE — Telephone Encounter (Signed)
 Patient came by the office and was advised

## 2023-07-31 NOTE — Addendum Note (Signed)
 Addended by: Consuella Lose on: 07/31/2023 02:11 PM   Modules accepted: Orders

## 2023-07-31 NOTE — Telephone Encounter (Signed)
 Left message with Mr Nixon for patient to call us back to be advised. No DPR on file. Rx sent in and Gemtesa d/c from the chart.

## 2023-08-01 DIAGNOSIS — F330 Major depressive disorder, recurrent, mild: Secondary | ICD-10-CM | POA: Diagnosis not present

## 2023-08-01 DIAGNOSIS — F411 Generalized anxiety disorder: Secondary | ICD-10-CM | POA: Diagnosis not present

## 2023-08-07 ENCOUNTER — Telehealth: Payer: Self-pay | Admitting: Urology

## 2023-08-07 NOTE — Telephone Encounter (Signed)
 Pt called due to having some complications with medication myrbetriq. Pt would like some advice on the next steps. Pt is stating they are having issues with the medication.

## 2023-08-07 NOTE — Telephone Encounter (Signed)
 Pt states she felt like she has gastritis when taking myrbetriq. Pt has stopped taking meds due to side effect. Pt wanted to talk to someone about different options for incontinence. Pt was scheduled to discuss different incontinence options with Sam this Thursday.

## 2023-08-10 ENCOUNTER — Ambulatory Visit: Admitting: Physician Assistant

## 2023-08-10 VITALS — BP 151/90 | HR 54

## 2023-08-10 DIAGNOSIS — N3941 Urge incontinence: Secondary | ICD-10-CM

## 2023-08-10 LAB — URINALYSIS, COMPLETE
Bilirubin, UA: NEGATIVE
Glucose, UA: NEGATIVE
Ketones, UA: NEGATIVE
Leukocytes,UA: NEGATIVE
Nitrite, UA: NEGATIVE
Protein,UA: NEGATIVE
RBC, UA: NEGATIVE
Specific Gravity, UA: 1.015 (ref 1.005–1.030)
Urobilinogen, Ur: 0.2 mg/dL (ref 0.2–1.0)
pH, UA: 6 (ref 5.0–7.5)

## 2023-08-10 LAB — MICROSCOPIC EXAMINATION

## 2023-08-10 LAB — BLADDER SCAN AMB NON-IMAGING: Scan Result: 100

## 2023-08-10 NOTE — Progress Notes (Signed)
 08/10/2023 10:07 AM   Darcey Nora [birth year] [id number]  CC: Chief Complaint  Patient presents with   Follow-up   HPI: Mary Elliott is a 83 y.o. female with PMH refractory OAB wet, alternating diarrhea and constipation, nephrolithiasis, chronic back pain s/p several epidural injections, and pelvic organ prolapse s/p vaginal hysterectomy with ligament suspension for uterine prolapse still with cystocele and rectocele who presents today for follow-up after failing Gemtesa.   She was given Gemtesa samples by Dr. Sherron Monday when she first saw him last month.  After about 3 days, she started to experience lower abdominal fullness and decreased urinary output.  She stopped taking Gemtesa due to concern for retention.  She was switched to Myrbetriq 25mg , but developed gastritis.  Today she reports she has failed multiple OAB therapies over the years as follows.  She was previously offered InterStim in 2020 prior to COVID, so she deferred this.  She did not complete a trial of InterStim.  Prior therapies: Vesicare: Headache Oxybutynin patch: Contact dermatitis Gemtesa: Urinary retention Myrbetriq: Gastritis PTNS: Completed initial series and maintenance for about a year around 2014.  Initially helpful, but completed series. Pelvic floor PT: Underwent about 9 months of therapy around 2017. Intravesical Botox: Urinary retention requiring CIC x 3 months.  No repeat treatments.  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  In office UA and microscopy today pan negative.  PVR 100 mL.  PMH: Past Medical History:  Diagnosis Date   Anemia    Anxiety    Arthritis    fingers   Cancer (HCC) 1999   basil cell on face   GERD (gastroesophageal reflux disease)    history of   History of kidney stones    twice   HOH (hard of hearing)    Hypertension    Hypothyroidism    PONV (postoperative nausea and vomiting)    nausea only with 2 prior surgeries   Tinnitus      Surgical History: Past Surgical History:  Procedure Laterality Date   ABDOMINAL HYSTERECTOMY     basil cell     face   BLEPHAROPLASTY     CATARACT EXTRACTION W/PHACO Left 08/26/2020   Procedure: CATARACT EXTRACTION PHACO AND INTRAOCULAR LENS PLACEMENT (IOC) LEFT PANOPTIX TORIC;  Surgeon: Lockie Mola, MD;  Location: [facility];  Service: Ophthalmology;  Laterality: Left;  8.88 1.:27.2 10.1%   CATARACT EXTRACTION W/PHACO Right 09/09/2020   Procedure: CATARACT EXTRACTION PHACO AND INTRAOCULAR LENS PLACEMENT (IOC) RIGHT PANOPTIX TORIC 8.54 01:03.3 13.5%;  Surgeon: Lockie Mola, MD;  Location: [facility];  Service: Ophthalmology;  Laterality: Right;   CYSTOCELE REPAIR  2005   HEMORROIDECTOMY     JOINT REPLACEMENT Bilateral 11/2016   KNEE ARTHROPLASTY Left 11/14/2016   Procedure: COMPUTER ASSISTED TOTAL KNEE ARTHROPLASTY;  Surgeon: Donato Heinz, MD;  Location: [facility];  Service: Orthopedics;  Laterality: Left;   KNEE ARTHROPLASTY Right 03/15/2017   Procedure: COMPUTER ASSISTED TOTAL KNEE ARTHROPLASTY;  Surgeon: Donato Heinz, MD;  Location: [facility];  Service: Orthopedics;  Laterality: Right;   KNEE ARTHROSCOPY Left    LIPOSUCTION     PELVIC FLOOR REPAIR     SHOULDER ARTHROSCOPY WITH DEBRIDEMENT AND BICEP TENDON REPAIR Left 09/24/2019   Procedure: Left shoulder arthroscopy with debridement, decompression, rotator cuff repair, and possible biceps tenodesis.;  Surgeon: Christena Flake, MD;  Location: [facility];  Service: Orthopedics;  Laterality: Left;   TOE SURGERY Right    TONSILLECTOMY  VARICOSE VEIN SURGERY      Home Medications:  Allergies as of 08/10/2023       Reactions   Clindamycin Itching   Clindamycin/lincomycin Hives   Nitrofurantoin Hives   Ciprofloxacin Other (See Comments)   Confusion, myalgia   Nickel Itching   Amlodipine Other (See Comments)   edema   Amoxicillin Itching   Duloxetine Other (See Comments)   Unable to empty  bladder At a high dose of med   Nsaids Other (See Comments)   Prior HX of ulcers   Oxybutynin Hives, Rash, Other (See Comments)   The patches cause a reaction.   Sulfa Antibiotics Other (See Comments), Diarrhea   GI UPSET Felt cloudy and couldn't move   Tegaserod Other (See Comments)   Joint pain   Ultram [tramadol] Itching   Vesicare [solifenacin Succinate] Other (See Comments)   headache        Medication List        Accurate as of August 10, 2023 10:07 AM. If you have any questions, ask your nurse or doctor.          cyanocobalamin 1000 MCG tablet Commonly known as: VITAMIN B12 Take 1,000 mcg by mouth daily.   estradiol 0.1 MG/GM vaginal cream Commonly known as: ESTRACE Place 1 Applicatorful vaginally 2 (two) times a week.   fluticasone 50 MCG/ACT nasal spray Commonly known as: FLONASE Place 2 sprays into both nostrils daily as needed for allergies or rhinitis.   levothyroxine 100 MCG tablet Commonly known as: SYNTHROID Take 100 mcg by mouth daily before breakfast.   loratadine 10 MG tablet Commonly known as: CLARITIN Take 10 mg by mouth daily as needed for allergies.   metoprolol succinate 100 MG 24 hr tablet Commonly known as: TOPROL-XL Take 100 mg by mouth daily.   metroNIDAZOLE 0.75 % cream Commonly known as: METROCREAM Apply 1 application topically 2 (two) times daily as needed (rosacea.).   mirabegron ER 25 MG Tb24 tablet Commonly known as: MYRBETRIQ Take 1 tablet (25 mg total) by mouth daily.   PROBIOTIC PO Take 1 capsule by mouth daily after lunch.   sertraline 100 MG tablet Commonly known as: ZOLOFT Take 100 mg by mouth daily.   Vitamin D3 50 MCG (2000 UT) capsule Take 2,000 Units by mouth daily.        Allergies:  Allergies  Allergen Reactions   Clindamycin Itching   Clindamycin/Lincomycin Hives   Nitrofurantoin Hives   Ciprofloxacin Other (See Comments)    Confusion, myalgia   Nickel Itching   Amlodipine Other (See  Comments)    edema   Amoxicillin Itching   Duloxetine Other (See Comments)    Unable to empty bladder At a high dose of med   Nsaids Other (See Comments)    Prior HX of ulcers   Oxybutynin Hives, Rash and Other (See Comments)    The patches cause a reaction.   Sulfa Antibiotics Other (See Comments) and Diarrhea    GI UPSET Felt cloudy and couldn't move   Tegaserod Other (See Comments)    Joint pain   Ultram [Tramadol] Itching   Vesicare [Solifenacin Succinate] Other (See Comments)    headache    Family History: Family History  Problem Relation Age of Onset   Breast cancer Neg Hx     Social History:   reports that she quit smoking about 55 years ago. Her smoking use included cigarettes. She has never used smokeless tobacco. She reports current alcohol use of about 4.0  standard drinks of alcohol per week. She reports that she does not use drugs.  Physical Exam: BP (!) 151/90   Pulse (!) 54   Constitutional:  Alert and oriented, no acute distress, nontoxic appearing HEENT: [place], AT Cardiovascular: No clubbing, cyanosis, or edema Respiratory: Normal respiratory effort, no increased work of breathing Skin: No rashes, bruises or suspicious lesions Neurologic: Grossly intact, no focal deficits, moving all 4 extremities Psychiatric: Normal mood and affect  Laboratory Data: Results for orders placed or performed in visit on 08/10/23  Microscopic Examination   Collection Time: 08/10/23  9:47 AM   Urine  Result Value Ref Range   WBC, UA 0-5 0 - 5 /hpf   RBC, Urine 0-2 0 - 2 /hpf   Epithelial Cells (non renal) 0-10 0 - 10 /hpf   Bacteria, UA Few None seen/Few  Urinalysis, Complete   Collection Time: 08/10/23  9:47 AM  Result Value Ref Range   Specific Gravity, UA 1.015 1.005 - 1.030   pH, UA 6.0 5.0 - 7.5   Color, UA Yellow Yellow   Appearance Ur Clear Clear   Leukocytes,UA Negative Negative   Protein,UA Negative Negative/Trace   Glucose, UA Negative Negative   Ketones,  UA Negative Negative   RBC, UA Negative Negative   Bilirubin, UA Negative Negative   Urobilinogen, Ur 0.2 0.2 - 1.0 mg/dL   Nitrite, UA Negative Negative   Microscopic Examination See below:   BLADDER SCAN AMB NON-IMAGING   Collection Time: 08/10/23  9:59 AM  Result Value Ref Range   Scan Result 100 ml    Assessment & Plan:   1. Urge incontinence (Primary) She has failed multiple medications and third line therapies due to side effects or urinary retention.  Stay off pharmacotherapy for now.  She is emptying appropriately today.  Will have her keep scheduled cystoscopy with Dr. Sherron Monday for consideration of possible InterStim. - BLADDER SCAN AMB NON-IMAGING - Urinalysis, Complete  Return for Keep follow-up as scheduled.  Carman Ching, PA-C  [facility] [place] [address] [place], [place] [address] [phone]

## 2023-08-16 DIAGNOSIS — N3941 Urge incontinence: Secondary | ICD-10-CM | POA: Diagnosis not present

## 2023-08-16 DIAGNOSIS — R1013 Epigastric pain: Secondary | ICD-10-CM | POA: Diagnosis not present

## 2023-08-16 DIAGNOSIS — R1084 Generalized abdominal pain: Secondary | ICD-10-CM | POA: Diagnosis not present

## 2023-08-18 DIAGNOSIS — F330 Major depressive disorder, recurrent, mild: Secondary | ICD-10-CM | POA: Diagnosis not present

## 2023-08-18 DIAGNOSIS — F411 Generalized anxiety disorder: Secondary | ICD-10-CM | POA: Diagnosis not present

## 2023-09-12 DIAGNOSIS — G629 Polyneuropathy, unspecified: Secondary | ICD-10-CM | POA: Diagnosis not present

## 2023-09-12 DIAGNOSIS — I10 Essential (primary) hypertension: Secondary | ICD-10-CM | POA: Diagnosis not present

## 2023-09-12 DIAGNOSIS — E7800 Pure hypercholesterolemia, unspecified: Secondary | ICD-10-CM | POA: Diagnosis not present

## 2023-09-12 DIAGNOSIS — M1A9XX0 Chronic gout, unspecified, without tophus (tophi): Secondary | ICD-10-CM | POA: Diagnosis not present

## 2023-09-12 DIAGNOSIS — E034 Atrophy of thyroid (acquired): Secondary | ICD-10-CM | POA: Diagnosis not present

## 2023-09-18 ENCOUNTER — Ambulatory Visit: Admitting: Urology

## 2023-09-18 VITALS — BP 147/88 | HR 57

## 2023-09-18 DIAGNOSIS — N3941 Urge incontinence: Secondary | ICD-10-CM

## 2023-09-18 LAB — URINALYSIS, COMPLETE
Bilirubin, UA: NEGATIVE
Glucose, UA: NEGATIVE
Ketones, UA: NEGATIVE
Leukocytes,UA: NEGATIVE
Nitrite, UA: NEGATIVE
Protein,UA: NEGATIVE
RBC, UA: NEGATIVE
Specific Gravity, UA: 1.01 (ref 1.005–1.030)
Urobilinogen, Ur: 0.2 mg/dL (ref 0.2–1.0)
pH, UA: 6 (ref 5.0–7.5)

## 2023-09-18 LAB — MICROSCOPIC EXAMINATION: Epithelial Cells (non renal): >10 /HPF — AB (ref 0–10)

## 2023-09-18 NOTE — Progress Notes (Signed)
 09/18/2023 9:21 AM   Mary Elliott Oct 29, 1940 [id number]  Referring provider: Melchor Spoon, MD [address] [facility] [place],  [place] [address]  Chief Complaint  Patient presents with   Cysto    HPI: I was consulted to assess the patient is urgency incontinence.  She soaks 6 pads a day.  She denies bedwetting and stress incontinence.   She voids every 1 or 2 hours and sometimes gets up once at night   She describes a cystocele repair and hysterectomy 2 years ago.  She was in a research study.  She says she has a rectocele.  She states she got headaches with Vesicare and try the oxybutynin patch    On pelvic examination patient had a small grade 2 cystocele that hinged over the urethra.  She has significant loss of vaginal length of approximately 7 cm at the send it to approximately 4 cm.  She had some vaginal narrowing.  She had minimal rectocele with the anterior wall and apex reduce.  She had no stress incontinence.  Patient has high-volume urge incontinence soaking several pads a day.  She has had previous bladder surgery.  She has frequency.  She has cystocele with loss of vaginal cuff support.  She feels a little bit of bulging but she said it is much better than it was years ago picture drawn.  I would like to have her come back for cystoscopy on Gemtesa  and proceed accordingly.  She understands that we may need to order urodynamics in the future.  She generally does not do that well with medication but she understands side effects are low  Today Patient saw a nurse practitioner approximately 1 month after my visit.  She stopped taking Gemtesa  due to concern for retention.  She was having some lower abdominal fullness and decreased urine output.  She was switched to Myrbetriq  but developed gastritis.  It appears that someone spoke to her about InterStim in 2020.  She had percutaneous tibial nerve stimulation in 2014.  She had Botox needing  self-catheterization in the past.  The more I speak to her it does sound that she may have had retention with Gemtesa  and she certainly developed retention 2 weeks post Botox at [facility].  Still has urge incontinence especially when she goes from sitting to standing position.  Other times she will feel the urge to go and then she cannot.  Frequency stable clinically not infected  Patient underwent flexible cystoscopy.  Bladder close and trigone were normal.  No cystitis.  No carcinoma.  No stress incontinence  She is a retired Engineer, civil (consulting)     PMH: Past Medical History:  Diagnosis Date   Anemia    Anxiety    Arthritis    fingers   Cancer (HCC) 1999   basil cell on face   GERD (gastroesophageal reflux disease)    history of   History of kidney stones    twice   HOH (hard of hearing)    Hypertension    Hypothyroidism    PONV (postoperative nausea and vomiting)    nausea only with 2 prior surgeries   Tinnitus     Surgical History: Past Surgical History:  Procedure Laterality Date   ABDOMINAL HYSTERECTOMY     basil cell     face   BLEPHAROPLASTY     CATARACT EXTRACTION W/PHACO Left 08/26/2020   Procedure: CATARACT EXTRACTION PHACO AND INTRAOCULAR LENS PLACEMENT (IOC) LEFT PANOPTIX TORIC;  Surgeon: Ignatius Makos,  Gregery Leander, MD;  Location: [facility];  Service: Ophthalmology;  Laterality: Left;  8.88 1.:27.2 10.1%   CATARACT EXTRACTION W/PHACO Right 09/09/2020   Procedure: CATARACT EXTRACTION PHACO AND INTRAOCULAR LENS PLACEMENT (IOC) RIGHT PANOPTIX TORIC 8.54 01:03.3 13.5%;  Surgeon: Annell Kidney, MD;  Location: [facility];  Service: Ophthalmology;  Laterality: Right;   CYSTOCELE REPAIR  2005   HEMORROIDECTOMY     JOINT REPLACEMENT Bilateral 11/2016   KNEE ARTHROPLASTY Left 11/14/2016   Procedure: COMPUTER ASSISTED TOTAL KNEE ARTHROPLASTY;  Surgeon: Arlyne Lame, MD;  Location: [facility];  Service: Orthopedics;  Laterality: Left;   KNEE ARTHROPLASTY  Right 03/15/2017   Procedure: COMPUTER ASSISTED TOTAL KNEE ARTHROPLASTY;  Surgeon: Arlyne Lame, MD;  Location: [facility];  Service: Orthopedics;  Laterality: Right;   KNEE ARTHROSCOPY Left    LIPOSUCTION     PELVIC FLOOR REPAIR     SHOULDER ARTHROSCOPY WITH DEBRIDEMENT AND BICEP TENDON REPAIR Left 09/24/2019   Procedure: Left shoulder arthroscopy with debridement, decompression, rotator cuff repair, and possible biceps tenodesis.;  Surgeon: Elner Hahn, MD;  Location: [facility];  Service: Orthopedics;  Laterality: Left;   TOE SURGERY Right    TONSILLECTOMY     VARICOSE VEIN SURGERY      Home Medications:  Allergies as of 09/18/2023       Reactions   Clindamycin Itching   Clindamycin/lincomycin Hives   Nitrofurantoin Hives   Ciprofloxacin  Other (See Comments)   Confusion, myalgia   Nickel Itching   Amlodipine Other (See Comments)   edema   Amoxicillin Itching   Duloxetine Other (See Comments)   Unable to empty bladder At a high dose of med   Nsaids Other (See Comments)   Prior HX of ulcers   Oxybutynin Hives, Rash, Other (See Comments)   The patches cause a reaction.   Sulfa Antibiotics Other (See Comments), Diarrhea   GI UPSET Felt cloudy and couldn't move   Tegaserod Other (See Comments)   Joint pain   Ultram  [tramadol ] Itching   Vesicare [solifenacin Succinate] Other (See Comments)   headache        Medication List        Accurate as of Sep 18, 2023  9:21 AM. If you have any questions, ask your nurse or doctor.          cyanocobalamin  1000 MCG tablet Commonly known as: VITAMIN B12 Take 1,000 mcg by mouth daily.   estradiol  0.1 MG/GM vaginal cream Commonly known as: ESTRACE  Place 1 Applicatorful vaginally 2 (two) times a week.   fluticasone  50 MCG/ACT nasal spray Commonly known as: FLONASE  Place 2 sprays into both nostrils daily as needed for allergies or rhinitis.   levothyroxine  100 MCG tablet Commonly known as: SYNTHROID  Take 100 mcg by mouth  daily before breakfast.   loratadine  10 MG tablet Commonly known as: CLARITIN  Take 10 mg by mouth daily as needed for allergies.   metoprolol  succinate 100 MG 24 hr tablet Commonly known as: TOPROL -XL Take 100 mg by mouth daily.   metroNIDAZOLE  0.75 % cream Commonly known as: METROCREAM  Apply 1 application topically 2 (two) times daily as needed (rosacea.).   mirabegron  ER 25 MG Tb24 tablet Commonly known as: MYRBETRIQ  Take 1 tablet (25 mg total) by mouth daily.   PROBIOTIC PO Take 1 capsule by mouth daily after lunch.   sertraline  100 MG tablet Commonly known as: ZOLOFT  Take 100 mg by mouth daily.   Vitamin D3 50 MCG (2000 UT) capsule Take  2,000 Units by mouth daily.        Allergies:  Allergies  Allergen Reactions   Clindamycin Itching   Clindamycin/Lincomycin Hives   Nitrofurantoin Hives   Ciprofloxacin  Other (See Comments)    Confusion, myalgia   Nickel Itching   Amlodipine Other (See Comments)    edema   Amoxicillin Itching   Duloxetine Other (See Comments)    Unable to empty bladder At a high dose of med   Nsaids Other (See Comments)    Prior HX of ulcers   Oxybutynin Hives, Rash and Other (See Comments)    The patches cause a reaction.   Sulfa Antibiotics Other (See Comments) and Diarrhea    GI UPSET Felt cloudy and couldn't move   Tegaserod Other (See Comments)    Joint pain   Ultram  [Tramadol ] Itching   Vesicare [Solifenacin Succinate] Other (See Comments)    headache    Family History: Family History  Problem Relation Age of Onset   Breast cancer Neg Hx     Social History:  reports that she quit smoking about 55 years ago. Her smoking use included cigarettes. She has never used smokeless tobacco. She reports current alcohol  use of about 4.0 standard drinks of alcohol  per week. She reports that she does not use drugs.  ROS:                                        Physical Exam: There were no vitals taken for this  visit.  Constitutional:  Alert and oriented, No acute distress. HEENT: [place] AT, moist mucus membranes.  Trachea midline, no masses.  Laboratory Data: Lab Results  Component Value Date   WBC 10.1 05/10/2022   HGB 12.0 05/10/2022   HCT 36.8 05/10/2022   MCV 93.4 05/10/2022   PLT 280 05/10/2022    Lab Results  Component Value Date   CREATININE 0.80 05/10/2022    No results found for: "PSA"  No results found for: "TESTOSTERONE"  No results found for: "HGBA1C"  Urinalysis    Component Value Date/Time   COLORURINE YELLOW (A) 05/08/2022 1112   APPEARANCEUR Clear 08/10/2023 0947   LABSPEC 1.011 05/08/2022 1112   PHURINE 5.0 05/08/2022 1112   GLUCOSEU Negative 08/10/2023 0947   HGBUR NEGATIVE 05/08/2022 1112   BILIRUBINUR Negative 08/10/2023 0947   KETONESUR 5 (A) 05/08/2022 1112   PROTEINUR Negative 08/10/2023 0947   PROTEINUR 30 (A) 05/08/2022 1112   NITRITE Negative 08/10/2023 0947   NITRITE NEGATIVE 05/08/2022 1112   LEUKOCYTESUR Negative 08/10/2023 0947   LEUKOCYTESUR TRACE (A) 05/08/2022 1112    Pertinent Imaging:   Assessment & Plan: Patient has high-volume urge incontinence.  She has a complicated past history.  Role of urodynamics discussed.  Role of InterStim discussed without details.  Close symptoms due to overactive bladder and/or prolapse.  We will get urodynamics and proceed accordingly.  1. Urge incontinence (Primary)  - Urinalysis, Complete   No follow-ups on file.  Devorah Fonder, MD  [facility] [address], [address] [place], [place] [address] [phone]

## 2023-09-18 NOTE — Addendum Note (Signed)
 Addended byKatina Parlor on: 09/18/2023 09:54 AM   Modules accepted: Orders

## 2023-09-19 DIAGNOSIS — W010XXA Fall on same level from slipping, tripping and stumbling without subsequent striking against object, initial encounter: Secondary | ICD-10-CM | POA: Diagnosis not present

## 2023-09-19 DIAGNOSIS — S0993XA Unspecified injury of face, initial encounter: Secondary | ICD-10-CM | POA: Diagnosis not present

## 2023-09-21 LAB — CULTURE, URINE COMPREHENSIVE

## 2023-09-22 DIAGNOSIS — F330 Major depressive disorder, recurrent, mild: Secondary | ICD-10-CM | POA: Diagnosis not present

## 2023-09-22 DIAGNOSIS — F411 Generalized anxiety disorder: Secondary | ICD-10-CM | POA: Diagnosis not present

## 2023-09-26 DIAGNOSIS — E034 Atrophy of thyroid (acquired): Secondary | ICD-10-CM | POA: Diagnosis not present

## 2023-09-26 DIAGNOSIS — E7800 Pure hypercholesterolemia, unspecified: Secondary | ICD-10-CM | POA: Diagnosis not present

## 2023-09-26 DIAGNOSIS — K219 Gastro-esophageal reflux disease without esophagitis: Secondary | ICD-10-CM | POA: Diagnosis not present

## 2023-09-26 DIAGNOSIS — I10 Essential (primary) hypertension: Secondary | ICD-10-CM | POA: Diagnosis not present

## 2023-09-26 DIAGNOSIS — Z131 Encounter for screening for diabetes mellitus: Secondary | ICD-10-CM | POA: Diagnosis not present

## 2023-09-26 DIAGNOSIS — F3341 Major depressive disorder, recurrent, in partial remission: Secondary | ICD-10-CM | POA: Diagnosis not present

## 2023-09-27 DIAGNOSIS — F411 Generalized anxiety disorder: Secondary | ICD-10-CM | POA: Diagnosis not present

## 2023-09-27 DIAGNOSIS — F3342 Major depressive disorder, recurrent, in full remission: Secondary | ICD-10-CM | POA: Diagnosis not present

## 2023-09-27 DIAGNOSIS — F410 Panic disorder [episodic paroxysmal anxiety] without agoraphobia: Secondary | ICD-10-CM | POA: Diagnosis not present

## 2023-10-09 DIAGNOSIS — N3941 Urge incontinence: Secondary | ICD-10-CM | POA: Diagnosis not present

## 2023-10-13 DIAGNOSIS — F411 Generalized anxiety disorder: Secondary | ICD-10-CM | POA: Diagnosis not present

## 2023-10-13 DIAGNOSIS — F330 Major depressive disorder, recurrent, mild: Secondary | ICD-10-CM | POA: Diagnosis not present

## 2023-10-16 DIAGNOSIS — E034 Atrophy of thyroid (acquired): Secondary | ICD-10-CM | POA: Diagnosis not present

## 2023-10-23 ENCOUNTER — Ambulatory Visit: Admitting: Urology

## 2023-10-23 VITALS — BP 149/83 | HR 60

## 2023-10-23 DIAGNOSIS — N3941 Urge incontinence: Secondary | ICD-10-CM | POA: Diagnosis not present

## 2023-10-23 LAB — URINALYSIS, COMPLETE
Bilirubin, UA: NEGATIVE
Glucose, UA: NEGATIVE
Ketones, UA: NEGATIVE
Nitrite, UA: NEGATIVE
Protein,UA: NEGATIVE
Specific Gravity, UA: 1.01 (ref 1.005–1.030)
Urobilinogen, Ur: 0.2 mg/dL (ref 0.2–1.0)
pH, UA: 6 (ref 5.0–7.5)

## 2023-10-23 LAB — MICROSCOPIC EXAMINATION: Epithelial Cells (non renal): >10 /HPF — AB (ref 0–10)

## 2023-10-23 NOTE — Patient Instructions (Signed)
[employer] InterStim Placement  Past treatments haven't given you satisfactory results, which may be due to the way these treatments focus on the muscle rather than the nerves. They don't target the miscommunication between your bladder and your brain. Because bladder function involves both muscles and nerves, you may need something that addresses the communication problem between the bladder and the brain that can cause symptoms.  Medtronic Bladder Control Therapy is unique because it offers a simple evaluation to see if it's right for you. The evaluation is a minimally invasive procedure We will be looking to see if your troublesome bladder symptoms are reduced by at least 50% In as few as 3 days, you'll know if:you're a candidate for long-term treatment we simply need more information   It's important that you document your symptoms before and during your evaluation to help me understand if you see any improvements. I will provide you with a Symptom Tracker and I would like you to document your symptoms for a minimum of three days. Bring your Symptom Tracker back with you at your next appointment.  Tracking daily will help [place] determine if the therapy delivered by the InterStimT system is right for you. You will have 3 appointments scheduled: 1st appointment for a 20-minute, in-office procedure that allows you to try the therapy for a few days. 2nd appointment to remove the lead (thin wire) from your evaluation. 3rd appointment is the date for long-term therapy if you're a candidate or for an advanced evaluation if we need more information.  Here's a brochure explaining the in-office procedure and long-term therapy, as well as the Symptom Tracker we discussed to document your symptoms. For more information, visit [email or web address].

## 2023-10-23 NOTE — Progress Notes (Signed)
 10/23/2023 2:52 PM   Mary Elliott 1941/01/24 [id number]  Referring provider: Melchor Spoon, MD [address] [facility] [place],  [place] [address]  Chief Complaint  Patient presents with   Urge incontinence    HPI: I was consulted to assess the patient is urgency incontinence.  She soaks 6 pads a day.  She denies bedwetting and stress incontinence.   She voids every 1 or 2 hours and sometimes gets up once at night   She describes a cystocele repair and hysterectomy 2 years ago.  She was in a research study.  She says she has a rectocele.  She states she got headaches with Vesicare and try the oxybutynin patch     On pelvic examination patient had a small grade 2 cystocele that hinged over the urethra.  She has significant loss of vaginal length of approximately 7 cm at the send it to approximately 4 cm.  She had some vaginal narrowing.  She had minimal rectocele with the anterior wall and apex reduce.  She had no stress incontinence.   Patient has high-volume urge incontinence soaking several pads a day.  She has had previous bladder surgery.  She has frequency.  She has cystocele with loss of vaginal cuff support.  She feels a little bit of bulging but she said it is much better than it was years ago picture drawn.  I would like to have her come back for cystoscopy on Gemtesa  and proceed accordingly. She generally does not do that well with medication but she understands side effects are low   Patient saw a nurse practitioner approximately 1 month after my visit.  She stopped taking Gemtesa  due to concern for retention.  She was having some lower abdominal fullness and decreased urine output.  She was switched to Myrbetriq  but developed gastritis.  It appears that someone spoke to her about InterStim in 2020.  She had percutaneous tibial nerve stimulation in 2014.  She had Botox needing self-catheterization in the past.  The more I speak to her it does sound that  she may have had retention with Gemtesa  and she certainly developed retention 2 weeks post Botox at [facility].   Still has urge incontinence especially when she goes from sitting to standing position.  Other times she will feel the urge to go and then she cannot.  Frequency stable clinically not infected   Patient underwent flexible cystoscopy.  Bladder close and trigone were normal.  No cystitis.  No carcinoma.  No stress incontinence   She is a retired Engineer, civil (consulting)     Patient has high-volume urge incontinence.  She has a complicated past history.  Role of urodynamics discussed.  Role of InterStim discussed without details.  Flow symptoms due to overactive bladder and/or prolapse.  We will get urodynamics and proceed accordingly.   Today Frequency stable.  Incontinence stable. During urodynamics patient initially voided 181 mL.  The residual was 25 mL.  I did not see the flow rate and I could not find the flow strip.  Maximum bladder capacity was 370 mL.  She increased bladder sensation.  Bladder was overactive detrusor pressure 16 cm of water.  She had urgency and leaked a moderate amount.  She had no stress incontinence with Valsalva pressure of 57 cm of water.  During voiding she voided 233 mL.  Maximal flow was 13 mL/s.  Max voiding pressure 13 cm of water.  She had interrupted pattern.  Residual was 123  mL.  Bladder neck distended less than a centimeter.  The details of the urodynamics are signed dictated    PMH: Past Medical History:  Diagnosis Date   Anemia    Anxiety    Arthritis    fingers   Cancer (HCC) 1999   basil cell on face   GERD (gastroesophageal reflux disease)    history of   History of kidney stones    twice   HOH (hard of hearing)    Hypertension    Hypothyroidism    PONV (postoperative nausea and vomiting)    nausea only with 2 prior surgeries   Tinnitus     Surgical History: Past Surgical History:  Procedure Laterality Date   ABDOMINAL HYSTERECTOMY      basil cell     face   BLEPHAROPLASTY     CATARACT EXTRACTION W/PHACO Left 08/26/2020   Procedure: CATARACT EXTRACTION PHACO AND INTRAOCULAR LENS PLACEMENT (IOC) LEFT PANOPTIX TORIC;  Surgeon: Annell Kidney, MD;  Location: [facility];  Service: Ophthalmology;  Laterality: Left;  8.88 1.:27.2 10.1%   CATARACT EXTRACTION W/PHACO Right 09/09/2020   Procedure: CATARACT EXTRACTION PHACO AND INTRAOCULAR LENS PLACEMENT (IOC) RIGHT PANOPTIX TORIC 8.54 01:03.3 13.5%;  Surgeon: Annell Kidney, MD;  Location: [facility];  Service: Ophthalmology;  Laterality: Right;   CYSTOCELE REPAIR  2005   HEMORROIDECTOMY     JOINT REPLACEMENT Bilateral 11/2016   KNEE ARTHROPLASTY Left 11/14/2016   Procedure: COMPUTER ASSISTED TOTAL KNEE ARTHROPLASTY;  Surgeon: Arlyne Lame, MD;  Location: [facility];  Service: Orthopedics;  Laterality: Left;   KNEE ARTHROPLASTY Right 03/15/2017   Procedure: COMPUTER ASSISTED TOTAL KNEE ARTHROPLASTY;  Surgeon: Arlyne Lame, MD;  Location: [facility];  Service: Orthopedics;  Laterality: Right;   KNEE ARTHROSCOPY Left    LIPOSUCTION     PELVIC FLOOR REPAIR     SHOULDER ARTHROSCOPY WITH DEBRIDEMENT AND BICEP TENDON REPAIR Left 09/24/2019   Procedure: Left shoulder arthroscopy with debridement, decompression, rotator cuff repair, and possible biceps tenodesis.;  Surgeon: Elner Hahn, MD;  Location: [facility];  Service: Orthopedics;  Laterality: Left;   TOE SURGERY Right    TONSILLECTOMY     VARICOSE VEIN SURGERY      Home Medications:  Allergies as of 10/23/2023       Reactions   Clindamycin Itching   Clindamycin/lincomycin Hives   Nitrofurantoin Hives   Ciprofloxacin  Other (See Comments)   Confusion, myalgia   Nickel Itching   Amlodipine Other (See Comments)   edema   Amoxicillin Itching   Duloxetine Other (See Comments)   Unable to empty bladder At a high dose of med   Nsaids Other (See Comments)   Prior HX of ulcers   Oxybutynin Hives, Rash,  Other (See Comments)   The patches cause a reaction.   Sulfa Antibiotics Other (See Comments), Diarrhea   GI UPSET Felt cloudy and couldn't move   Tegaserod Other (See Comments)   Joint pain   Ultram  [tramadol ] Itching   Vesicare [solifenacin Succinate] Other (See Comments)   headache        Medication List        Accurate as of October 23, 2023  2:52 PM. If you have any questions, ask your nurse or doctor.          cyanocobalamin  1000 MCG tablet Commonly known as: VITAMIN B12 Take 1,000 mcg by mouth daily.   estradiol  0.1 MG/GM vaginal cream Commonly known as: ESTRACE  Place 1 Applicatorful vaginally  2 (two) times a week.   fluticasone  50 MCG/ACT nasal spray Commonly known as: FLONASE  Place 2 sprays into both nostrils daily as needed for allergies or rhinitis.   levothyroxine  100 MCG tablet Commonly known as: SYNTHROID  Take 100 mcg by mouth daily before breakfast.   loratadine  10 MG tablet Commonly known as: CLARITIN  Take 10 mg by mouth daily as needed for allergies.   metoprolol  succinate 100 MG 24 hr tablet Commonly known as: TOPROL -XL Take 100 mg by mouth daily.   metroNIDAZOLE  0.75 % cream Commonly known as: METROCREAM  Apply 1 application topically 2 (two) times daily as needed (rosacea.).   mirabegron  ER 25 MG Tb24 tablet Commonly known as: MYRBETRIQ  Take 1 tablet (25 mg total) by mouth daily.   PROBIOTIC PO Take 1 capsule by mouth daily after lunch.   sertraline  100 MG tablet Commonly known as: ZOLOFT  Take 100 mg by mouth daily.   Vitamin D3 50 MCG (2000 UT) capsule Take 2,000 Units by mouth daily.        Allergies:  Allergies  Allergen Reactions   Clindamycin Itching   Clindamycin/Lincomycin Hives   Nitrofurantoin Hives   Ciprofloxacin  Other (See Comments)    Confusion, myalgia   Nickel Itching   Amlodipine Other (See Comments)    edema   Amoxicillin Itching   Duloxetine Other (See Comments)    Unable to empty bladder At a high  dose of med   Nsaids Other (See Comments)    Prior HX of ulcers   Oxybutynin Hives, Rash and Other (See Comments)    The patches cause a reaction.   Sulfa Antibiotics Other (See Comments) and Diarrhea    GI UPSET Felt cloudy and couldn't move   Tegaserod Other (See Comments)    Joint pain   Ultram  [Tramadol ] Itching   Vesicare [Solifenacin Succinate] Other (See Comments)    headache    Family History: Family History  Problem Relation Age of Onset   Breast cancer Neg Hx     Social History:  reports that she quit smoking about 55 years ago. Her smoking use included cigarettes. She has never used smokeless tobacco. She reports current alcohol  use of about 4.0 standard drinks of alcohol  per week. She reports that she does not use drugs.  ROS:                                        Physical Exam: There were no vitals taken for this visit.  Constitutional:  Alert and oriented, No acute distress. HEENT: [place] AT, moist mucus membranes.  Trachea midline, no masses.   Laboratory Data: Lab Results  Component Value Date   WBC 10.1 05/10/2022   HGB 12.0 05/10/2022   HCT 36.8 05/10/2022   MCV 93.4 05/10/2022   PLT 280 05/10/2022    Lab Results  Component Value Date   CREATININE 0.80 05/10/2022    No results found for: PSA  No results found for: TESTOSTERONE  No results found for: HGBA1C  Urinalysis    Component Value Date/Time   COLORURINE YELLOW (A) 05/08/2022 1112   APPEARANCEUR Clear 09/18/2023 0922   LABSPEC 1.011 05/08/2022 1112   PHURINE 5.0 05/08/2022 1112   GLUCOSEU Negative 09/18/2023 0922   HGBUR NEGATIVE 05/08/2022 1112   BILIRUBINUR Negative 09/18/2023 0922   KETONESUR 5 (A) 05/08/2022 1112   PROTEINUR Negative 09/18/2023 0922   PROTEINUR 30 (A) 05/08/2022  1112   NITRITE Negative 09/18/2023 0922   NITRITE NEGATIVE 05/08/2022 1112   LEUKOCYTESUR Negative 09/18/2023 0922   LEUKOCYTESUR TRACE (A) 05/08/2022 1112     Pertinent Imaging:   Assessment & Plan: Patient has refractory overactive bladder.  She has tried various therapies and medications.  I went over InterStim with full template.  She would like to proceed and I think overall is a good candidate.  Handout given.  Site of service discussed and she does not mind doing the implant however it works best for everyone.  The peripheral nerve evaluation will be done in [place]  1. Urge incontinence (Primary)  - Urinalysis, Complete   No follow-ups on file.  Devorah Fonder, MD  [facility] [address], [address] [place], [place] [address] [phone]

## 2023-10-24 DIAGNOSIS — R29898 Other symptoms and signs involving the musculoskeletal system: Secondary | ICD-10-CM | POA: Diagnosis not present

## 2023-10-24 DIAGNOSIS — M9963 Osseous and subluxation stenosis of intervertebral foramina of lumbar region: Secondary | ICD-10-CM | POA: Diagnosis not present

## 2023-10-24 DIAGNOSIS — M4316 Spondylolisthesis, lumbar region: Secondary | ICD-10-CM | POA: Diagnosis not present

## 2023-10-24 DIAGNOSIS — E7800 Pure hypercholesterolemia, unspecified: Secondary | ICD-10-CM | POA: Diagnosis not present

## 2023-10-24 DIAGNOSIS — I10 Essential (primary) hypertension: Secondary | ICD-10-CM | POA: Diagnosis not present

## 2023-10-24 DIAGNOSIS — G629 Polyneuropathy, unspecified: Secondary | ICD-10-CM | POA: Diagnosis not present

## 2023-10-24 DIAGNOSIS — F3341 Major depressive disorder, recurrent, in partial remission: Secondary | ICD-10-CM | POA: Diagnosis not present

## 2023-10-24 DIAGNOSIS — E034 Atrophy of thyroid (acquired): Secondary | ICD-10-CM | POA: Diagnosis not present

## 2023-10-26 DIAGNOSIS — F330 Major depressive disorder, recurrent, mild: Secondary | ICD-10-CM | POA: Diagnosis not present

## 2023-10-26 DIAGNOSIS — F411 Generalized anxiety disorder: Secondary | ICD-10-CM | POA: Diagnosis not present

## 2023-10-26 LAB — CULTURE, URINE COMPREHENSIVE

## 2023-10-27 ENCOUNTER — Ambulatory Visit: Payer: HMO | Admitting: Obstetrics and Gynecology

## 2023-10-30 ENCOUNTER — Other Ambulatory Visit: Payer: Self-pay | Admitting: Internal Medicine

## 2023-10-30 DIAGNOSIS — R29898 Other symptoms and signs involving the musculoskeletal system: Secondary | ICD-10-CM

## 2023-10-30 DIAGNOSIS — M4316 Spondylolisthesis, lumbar region: Secondary | ICD-10-CM

## 2023-10-31 ENCOUNTER — Ambulatory Visit
Admission: RE | Admit: 2023-10-31 | Discharge: 2023-10-31 | Disposition: A | Discharge status: Home / Self Care | Source: Ambulatory Visit | Attending: Internal Medicine | Admitting: Internal Medicine

## 2023-10-31 DIAGNOSIS — M4316 Spondylolisthesis, lumbar region: Secondary | ICD-10-CM | POA: Insufficient documentation

## 2023-10-31 DIAGNOSIS — M47816 Spondylosis without myelopathy or radiculopathy, lumbar region: Secondary | ICD-10-CM | POA: Diagnosis not present

## 2023-10-31 DIAGNOSIS — M4807 Spinal stenosis, lumbosacral region: Secondary | ICD-10-CM | POA: Diagnosis not present

## 2023-10-31 DIAGNOSIS — R29898 Other symptoms and signs involving the musculoskeletal system: Secondary | ICD-10-CM | POA: Diagnosis not present

## 2023-10-31 DIAGNOSIS — M48061 Spinal stenosis, lumbar region without neurogenic claudication: Secondary | ICD-10-CM | POA: Diagnosis not present

## 2023-11-05 ENCOUNTER — Other Ambulatory Visit: Payer: Self-pay

## 2023-11-05 ENCOUNTER — Emergency Department
Admission: EM | Admit: 2023-11-05 | Discharge: 2023-11-05 | Disposition: A | Discharge status: Home / Self Care | Attending: Emergency Medicine | Admitting: Emergency Medicine

## 2023-11-05 ENCOUNTER — Emergency Department

## 2023-11-05 DIAGNOSIS — I10 Essential (primary) hypertension: Secondary | ICD-10-CM | POA: Diagnosis not present

## 2023-11-05 DIAGNOSIS — E039 Hypothyroidism, unspecified: Secondary | ICD-10-CM | POA: Insufficient documentation

## 2023-11-05 DIAGNOSIS — K5730 Diverticulosis of large intestine without perforation or abscess without bleeding: Secondary | ICD-10-CM | POA: Diagnosis not present

## 2023-11-05 DIAGNOSIS — R1032 Left lower quadrant pain: Secondary | ICD-10-CM | POA: Insufficient documentation

## 2023-11-05 DIAGNOSIS — K449 Diaphragmatic hernia without obstruction or gangrene: Secondary | ICD-10-CM | POA: Diagnosis not present

## 2023-11-05 LAB — COMPREHENSIVE METABOLIC PANEL WITH GFR
ALT: 13 U/L (ref 0–44)
AST: 22 U/L (ref 15–41)
Albumin: 4.2 g/dL (ref 3.5–5.0)
Alkaline Phosphatase: 59 U/L (ref 38–126)
Anion gap: 8 (ref 5–15)
BUN: 22 mg/dL (ref 8–23)
CO2: 25 mmol/L (ref 22–32)
Calcium: 9 mg/dL (ref 8.9–10.3)
Chloride: 105 mmol/L (ref 98–111)
Creatinine, Ser: 0.92 mg/dL (ref 0.44–1.00)
GFR, Estimated: >60 mL/min (ref >60)
Glucose, Bld: 97 mg/dL (ref 70–99)
Potassium: 4 mmol/L (ref 3.5–5.1)
Sodium: 138 mmol/L (ref 135–145)
Total Bilirubin: 0.6 mg/dL (ref 0.0–1.2)
Total Protein: 7 g/dL (ref 6.5–8.1)

## 2023-11-05 LAB — CBC
HCT: 40.9 % (ref 36.0–46.0)
Hemoglobin: 13.5 g/dL (ref 12.0–15.0)
MCH: 30.6 pg (ref 26.0–34.0)
MCHC: 33 g/dL (ref 30.0–36.0)
MCV: 92.7 fL (ref 80.0–100.0)
Platelets: 264 10*3/uL (ref 150–400)
RBC: 4.41 MIL/uL (ref 3.87–5.11)
RDW: 13.4 % (ref 11.5–15.5)
WBC: 6.7 10*3/uL (ref 4.0–10.5)
nRBC: 0 % (ref 0.0–0.2)

## 2023-11-05 LAB — LIPASE, BLOOD: Lipase: 43 U/L (ref 11–51)

## 2023-11-05 MED ORDER — IOHEXOL 300 MG/ML  SOLN
100.0000 mL | Freq: Once | INTRAMUSCULAR | Status: AC | PRN
  Administered 2023-11-05: 100 mL via INTRAVENOUS

## 2023-11-05 MED ORDER — AMOXICILLIN-POT CLAVULANATE 875-125 MG PO TABS: 1.0000 | ORAL_TABLET | Freq: Two times a day (BID) | ORAL | 0 refills | Status: AC

## 2023-11-05 NOTE — ED Triage Notes (Signed)
 Pt arrives via POV with c/o lower abdominal pain that started on Friday. Pt states that she feels like her colon is inflamed and has a hx of diverticulitis. Pt is A&Ox4 and ambulatory during triage.

## 2023-11-05 NOTE — ED Provider Notes (Signed)
 CT scan is reassuring, discussed with patient, will start on Augmentin for presumed early diverticulitis, strict return precautions, that she thinks that she will tolerate this medication as she usually takes with Benadryl  without issues   Arlander [place], MD 11/05/23 1711

## 2023-11-05 NOTE — ED Provider Notes (Signed)
 [facility] Provider Note    Event Date/Time   First MD Initiated Contact with Patient 11/05/23 1357     (approximate)   History   Chief Complaint Abdominal Pain   HPI  Mary Elliott is a 83 y.o. female with past medical history of hypertension, anemia, hypothyroidism, and diverticulitis who presents to the ED complaining of abdominal pain.  Patient reports that she has had 2 days of increasing pain in the left lower quadrant of her abdomen similar to prior episodes of diverticulitis.  She describes the pain as sharp, not exacerbated or alleviated by anything in particular.  She denies any associated nausea, vomiting, or changes in her bowel movements.  She has not had any fevers, dysuria, or flank pain.     Physical Exam   Triage Vital Signs: ED Triage Vitals  Encounter Vitals Group     BP 11/05/23 1347 (!) 172/92     Girls Systolic BP Percentile --      Girls Diastolic BP Percentile --      Boys Systolic BP Percentile --      Boys Diastolic BP Percentile --      Pulse Rate 11/05/23 1347 (!) 58     Resp 11/05/23 1347 17     Temp 11/05/23 1347 98.6 F (37 C)     Temp Source 11/05/23 1347 Oral     SpO2 11/05/23 1347 99 %     Weight 11/05/23 1352 184 lb (83.5 kg)     Height 11/05/23 1352 5' 1 (1.549 m)     Head Circumference --      Peak Flow --      Pain Score 11/05/23 1348 1     Pain Loc --      Pain Education --      Exclude from Growth Chart --     Most recent vital signs: Vitals:   11/05/23 1347  BP: (!) 172/92  Pulse: (!) 58  Resp: 17  Temp: 98.6 F (37 C)  SpO2: 99%    Constitutional: Alert and oriented. Eyes: Conjunctivae are normal. Head: Atraumatic. Nose: No congestion/rhinnorhea. Mouth/Throat: Mucous membranes are moist.  Cardiovascular: Normal rate, regular rhythm. Grossly normal heart sounds.  2+ radial pulses bilaterally. Respiratory: Normal respiratory effort.  No retractions. Lungs CTAB. Gastrointestinal:  Soft and tender to palpation in the left lower quadrant with no rebound or guarding.  No CVA tenderness bilaterally. No distention. Musculoskeletal: No lower extremity tenderness nor edema.  Neurologic:  Normal speech and language. No gross focal neurologic deficits are appreciated.    ED Results / Procedures / Treatments   Labs (all labs ordered are listed, but only abnormal results are displayed) Labs Reviewed  LIPASE, BLOOD  COMPREHENSIVE METABOLIC PANEL WITH GFR  CBC  URINALYSIS, ROUTINE W REFLEX MICROSCOPIC    PROCEDURES:  Critical Care performed: No  Procedures   MEDICATIONS ORDERED IN ED: Medications - No data to display   IMPRESSION / MDM / ASSESSMENT AND PLAN / ED COURSE  I reviewed the triage vital signs and the nursing notes.                              83 y.o. female with past medical history of hypertension, anemia, hypothyroidism, and diverticulitis who presents to the ED with 2 days of increasing pain in the left lower quadrant of her abdomen.  Patient's presentation is most consistent with acute presentation with potential  threat to life or bodily function.  Differential diagnosis includes, but is not limited to, diverticulitis, abscess, perforation, UTI, kidney stone, colitis.  Patient nontoxic-appearing and in no acute distress, vital signs are unremarkable.  Her abdomen is soft but she does have tenderness to palpation in the left lower quadrant, will further assess with CT imaging.  She does report a history of abscess associated with diverticulitis, previously resolved with IV antibiotics.  Lab results are pending at this time, patient declines pain or nausea medication.  Labs without significant anemia, leukocytosis, electrolyte abnormality, or AKI.  LFTs and lipase are unremarkable, urinalysis pending at this time.  Patient turned over to oncoming provider pending CT results.      FINAL CLINICAL IMPRESSION(S) / ED DIAGNOSES   Final diagnoses:   LLQ abdominal pain     Rx / DC Orders   ED Discharge Orders     None        Note:  This document was prepared using Dragon voice recognition software and may include unintentional dictation errors.   Willo Dunnings, MD 11/05/23 1447

## 2023-11-05 NOTE — ED Notes (Signed)
 Blood work sent at this time.

## 2023-11-13 DIAGNOSIS — M48062 Spinal stenosis, lumbar region with neurogenic claudication: Secondary | ICD-10-CM | POA: Insufficient documentation

## 2023-11-16 ENCOUNTER — Inpatient Hospital Stay
Admission: RE | Admit: 2023-11-16 | Discharge: 2023-11-16 | Disposition: A | Discharge status: Home / Self Care | Payer: Self-pay | Source: Ambulatory Visit | Attending: Orthopedic Surgery | Admitting: Orthopedic Surgery

## 2023-11-16 ENCOUNTER — Other Ambulatory Visit: Payer: Self-pay | Admitting: Family Medicine

## 2023-11-16 DIAGNOSIS — Z049 Encounter for examination and observation for unspecified reason: Secondary | ICD-10-CM

## 2023-11-20 DIAGNOSIS — E039 Hypothyroidism, unspecified: Secondary | ICD-10-CM | POA: Insufficient documentation

## 2023-11-20 NOTE — Progress Notes (Unsigned)
 Referring Physician:  Fernande Ophelia JINNY DOUGLAS, MD [address] [facility] [place],  [place] [address]  Primary Physician:  Fernande Ophelia JINNY DOUGLAS, MD  History of Present Illness: 11/23/2023 [name] has a history of hypothyroidism, neuropathy, hyperlipidemia, gastric ulcers, diverticulitis, HTN.   She is a poor historian.   She got up off the toilet in June and felt weakness in both legs- felt like legs were going to give way. This improved after a few seconds. This has happened multiple times and has started happening when she is walking.   She does not have any significant back pain. With any prolonged standing or walking, she has weakness and heaviness in both legs. Legs feel like noodles. This improves when she stops and sits down. She does better with a grocery cart.   No NSAIDs due to history of gastric ulcers. She felt like PT made her stronger.   Tobacco use: Does not smoke.   Bowel/Bladder Dysfunction: she notes bladder incontinence x years. She notes some occasional bowel incontinence x years- she does not always feel the urge to go. She's had bowel issues since hemorrhoidectomy at age 31.   Conservative measures:  Physical therapy:  has participated in physical therapy with [facility] 11/2022-05/2023 Multimodal medical therapy including regular antiinflammatories: none Injections: 12/31/2019: Left L5-S1 and left S1 transforaminal ESI 09/05/2012: Left sacroiliac joint injection   Past Surgery: no spine surgery  Mary Elliott has no symptoms of cervical myelopathy.  The symptoms are causing a significant impact on the patient's life.   Review of Systems:  A 10 point review of systems is negative, except for the pertinent positives and negatives detailed in the HPI.  Past Medical History: Past Medical History:  Diagnosis Date   Anemia    Anxiety    Arthritis    fingers   Cancer (HCC) 1999   basil cell on face   GERD (gastroesophageal reflux  disease)    history of   History of kidney stones    twice   HOH (hard of hearing)    Hypertension    Hypothyroidism    PONV (postoperative nausea and vomiting)    nausea only with 2 prior surgeries   Tinnitus     Past Surgical History: Past Surgical History:  Procedure Laterality Date   ABDOMINAL HYSTERECTOMY     basil cell     face   BLEPHAROPLASTY     CATARACT EXTRACTION W/PHACO Left 08/26/2020   Procedure: CATARACT EXTRACTION PHACO AND INTRAOCULAR LENS PLACEMENT (IOC) LEFT PANOPTIX TORIC;  Surgeon: Mittie Gaskin, MD;  Location: [facility];  Service: Ophthalmology;  Laterality: Left;  8.88 1.:27.2 10.1%   CATARACT EXTRACTION W/PHACO Right 09/09/2020   Procedure: CATARACT EXTRACTION PHACO AND INTRAOCULAR LENS PLACEMENT (IOC) RIGHT PANOPTIX TORIC 8.54 01:03.3 13.5%;  Surgeon: Mittie Gaskin, MD;  Location: [facility];  Service: Ophthalmology;  Laterality: Right;   CYSTOCELE REPAIR  2005   HEMORROIDECTOMY     JOINT REPLACEMENT Bilateral 11/2016   KNEE ARTHROPLASTY Left 11/14/2016   Procedure: COMPUTER ASSISTED TOTAL KNEE ARTHROPLASTY;  Surgeon: Mardee Lynwood SQUIBB, MD;  Location: [facility];  Service: Orthopedics;  Laterality: Left;   KNEE ARTHROPLASTY Right 03/15/2017   Procedure: COMPUTER ASSISTED TOTAL KNEE ARTHROPLASTY;  Surgeon: Mardee Lynwood SQUIBB, MD;  Location: [facility];  Service: Orthopedics;  Laterality: Right;   KNEE ARTHROSCOPY Left    LIPOSUCTION     PELVIC FLOOR REPAIR     SHOULDER ARTHROSCOPY WITH DEBRIDEMENT  AND BICEP TENDON REPAIR Left 09/24/2019   Procedure: Left shoulder arthroscopy with debridement, decompression, rotator cuff repair, and possible biceps tenodesis.;  Surgeon: Edie Norleen PARAS, MD;  Location: [facility];  Service: Orthopedics;  Laterality: Left;   TOE SURGERY Right    TONSILLECTOMY     VARICOSE VEIN SURGERY      Allergies: Allergies as of 11/23/2023 - Review Complete 11/05/2023  Allergen Reaction Noted   Clindamycin Itching  07/24/2023   Clindamycin/lincomycin Hives 01/11/2017   Nitrofurantoin Hives 01/03/2013   Ciprofloxacin  Other (See Comments) 04/11/2023   Nickel Itching 07/24/2023   Amlodipine Other (See Comments) 07/03/2017   Amoxicillin  Itching 07/09/2018   Duloxetine Other (See Comments) 01/03/2013   Nsaids Other (See Comments) 01/03/2013   Oxybutynin Hives, Rash, and Other (See Comments) 01/03/2013   Sulfa antibiotics Other (See Comments) and Diarrhea 01/03/2013   Tegaserod Other (See Comments) 07/23/2013   Ultram  [tramadol ] Itching 03/02/2017   Vesicare [solifenacin succinate] Other (See Comments) 11/02/2016    Medications: Outpatient Encounter Medications as of 11/23/2023  Medication Sig   busPIRone (BUSPAR) 5 MG tablet Take 5 mg by mouth 2 (two) times daily.   Cholecalciferol  (VITAMIN D3) 2000 units capsule Take 2,000 Units by mouth daily.   estradiol  (ESTRACE ) 0.1 MG/GM vaginal cream Place 1 Applicatorful vaginally 2 (two) times a week.    fluticasone  (FLONASE ) 50 MCG/ACT nasal spray Place 2 sprays into both nostrils daily as needed for allergies or rhinitis.   levothyroxine  (SYNTHROID , LEVOTHROID) 100 MCG tablet Take 100 mcg by mouth daily before breakfast.   loratadine  (CLARITIN ) 10 MG tablet Take 10 mg by mouth daily as needed for allergies.   metoprolol  succinate (TOPROL -XL) 100 MG 24 hr tablet Take 100 mg by mouth daily.    metroNIDAZOLE  (METROCREAM ) 0.75 % cream Apply 1 application topically 2 (two) times daily as needed (rosacea.).    mirabegron  ER (MYRBETRIQ ) 25 MG TB24 tablet Take 1 tablet (25 mg total) by mouth daily.   Probiotic Product (PROBIOTIC PO) Take 1 capsule by mouth daily after lunch.   sertraline  (ZOLOFT ) 100 MG tablet Take 100 mg by mouth daily.   vitamin B-12 (CYANOCOBALAMIN ) 1000 MCG tablet Take 1,000 mcg by mouth daily.   No facility-administered encounter medications on file as of 11/23/2023.    Social History: Social History   Tobacco Use   Smoking status:  Former    Current packs/day: 0.00    Types: Cigarettes    Quit date: 03/02/1968    Years since quitting: 55.7   Smokeless tobacco: Never  Vaping Use   Vaping status: Never Used  Substance Use Topics   Alcohol  use: Yes    Alcohol /week: 4.0 standard drinks of alcohol     Types: 2 Glasses of wine, 2 Cans of beer per week    Comment: occasionally   Drug use: No    Family Medical History: Family History  Problem Relation Age of Onset   Breast cancer Neg Hx     Physical Examination: There were no vitals filed for this visit.  General: Patient is well developed, well nourished, calm, collected, and in no apparent distress. Attention to examination is appropriate.  Respiratory: Patient is breathing without any difficulty.   NEUROLOGICAL:     Awake, alert, oriented to person, place, and time.  Speech is clear and fluent. Fund of knowledge is appropriate.   Cranial Nerves: Pupils equal round and reactive to light.  Facial tone is symmetric.    No posterior lumbar tenderness.   No  abnormal lesions on exposed skin.   Strength: Side Biceps Triceps Deltoid Interossei Grip Wrist Ext. Wrist Flex.  R 5 5 5 5 5 5 5   L 5 5 5 5 5 5 5    Side Iliopsoas Quads Hamstring PF DF EHL  R 5 5 5 5 5 5   L 5 5 5 5 5 5    Reflexes are 2+ and symmetric at the biceps, brachioradialis, patella and achilles.   Hoffman's is absent.  Clonus is not present.   Bilateral upper and lower extremity sensation is intact to light touch.     No pain with IR/ER of both hips.   Gait is slow. She ambulates with a cane.   Medical Decision Making  Imaging: Lumbar xrays dated 10/21/23:  Lumbar spondylosis with slip L4-L5.   No report available for above xrays.    Lumbar MRI dated 10/31/23:  FINDINGS: Segmentation: Conventional anatomy assumed, with the last open disc space designated L5-S1.Concordant with prior imaging.   Alignment: Unchanged degenerative anterolisthesis at L4-5 measuring approximately  7 mm.   Vertebrae: No worrisome osseous lesion, acute fracture or pars defect. Stable endplate degenerative changes at L2-3 with patchy marrow edema in the left aspect of the L3 vertebral body. The visualized sacroiliac joints appear unremarkable.   Conus medullaris: Extends to the L1-2 level. The conus and cauda equina appear normal.   Paraspinal and other soft tissues: No significant paraspinal findings.   Disc levels:   Sagittal images demonstrate no significant disc space findings within the visualized lower thoracic spine.   L1-2: Preserved disc height with stable mild disc bulging and bilateral facet hypertrophy. No significant spinal stenosis or foraminal narrowing.   L2-3: Progressive loss of disc height with annular disc bulging and endplate osteophytes asymmetric to the left. Small disc protrusion in the left subarticular zone and mildly increased facet and ligamentous hypertrophy. Resulting progressive multifactorial spinal stenosis, now moderate. There is asymmetric narrowing of the left lateral recess with probable left L3 nerve root impingement in the canal. Mild foraminal narrowing bilaterally without definite L2 nerve root impingement.   L3-4: Mildly progressive loss of disc height with annular disc bulging and progressive facet and ligamentous hypertrophy. New mild multifactorial spinal stenosis with mild lateral recess and foraminal narrowing bilaterally. No definite nerve root impingement.   L4-5: Similar chronic loss of disc height with annular disc bulging and uncovering. There is moderate to severe bilateral facet hypertrophy accounting for the grade 1 anterolisthesis and contributing to increasing multifactorial spinal stenosis, now moderate. Persistent moderate narrowing of the lateral recesses (greater on the right) and foramina (greater on the left).   L5-S1: Preserved disc height and hydration. Stable facet hypertrophy, greater on the right.  Stable chronic asymmetric narrowing of the right lateral recess. The foramina remain patent.   IMPRESSION: 1. Compared with previous MRI from 08/28/2019, there is progressive multifactorial spinal stenosis at L2-3 and L4-5, now moderate at both levels. 2. There is asymmetric narrowing of the left lateral recess at L2-3 with probable left L3 nerve root impingement in the canal. Moderate lateral recess and foraminal narrowing bilaterally at L4-5. 3. Mild multifactorial spinal stenosis at L3-4 without nerve root impingement. 4. Stable chronic asymmetric narrowing of the right lateral recess at L5-S1. 5. No acute findings demonstrated.     Electronically Signed   By: Elsie Perone M.D.   On: 11/09/2023 14:10    I have personally reviewed the images and agree with the above interpretation.  Assessment and Plan: Ms.  Cancel does not have any significant back pain. With any prolonged standing or walking, she has weakness and heaviness in both legs. Legs feel like noodles. This improves when she stops and sits down. She does better with a grocery cart.   She has known moderate central stenosis L2-L5, with slip at L4-L5 along with moderate foraminal stenosis L4-L5 and right lateral recess stenosis L5-S1.   Symptoms are consistent with spinal stenosis.   Treatment options discussed with patient and following plan made:   - She has failed conservative treatment including PT, medications, and time.  - Recommend she follow up with Dr. Clois to discuss any possible surgical options. She may want to revisit injections, but she wants to understand her options.  - Will get lumbar flex/ext xrays on her way out today. Will message her with results.   I spent a total of 45 minutes in face-to-face and non-face-to-face activities related to this patient's care today including review of outside records, review of imaging, review of symptoms, physical exam, discussion of differential diagnosis,  discussion of treatment options, and documentation.   Thank you for involving me in the care of this patient.   Glade Boys PA-C Dept. of Neurosurgery

## 2023-11-23 ENCOUNTER — Ambulatory Visit: Admitting: Orthopedic Surgery

## 2023-11-23 ENCOUNTER — Ambulatory Visit
Admission: RE | Admit: 2023-11-23 | Discharge: 2023-11-23 | Disposition: A | Discharge status: Home / Self Care | Source: Ambulatory Visit | Attending: Orthopedic Surgery | Admitting: Orthopedic Surgery

## 2023-11-23 ENCOUNTER — Encounter: Payer: Self-pay | Admitting: Orthopedic Surgery

## 2023-11-23 ENCOUNTER — Ambulatory Visit
Admission: RE | Admit: 2023-11-23 | Discharge: 2023-11-23 | Disposition: A | Discharge status: Home / Self Care | Attending: Orthopedic Surgery | Admitting: Orthopedic Surgery

## 2023-11-23 VITALS — BP 128/76 | Ht 61.0 in | Wt 189.4 lb

## 2023-11-23 DIAGNOSIS — M48062 Spinal stenosis, lumbar region with neurogenic claudication: Secondary | ICD-10-CM

## 2023-11-23 DIAGNOSIS — M5416 Radiculopathy, lumbar region: Secondary | ICD-10-CM | POA: Diagnosis not present

## 2023-11-23 DIAGNOSIS — M48061 Spinal stenosis, lumbar region without neurogenic claudication: Secondary | ICD-10-CM | POA: Diagnosis not present

## 2023-11-23 DIAGNOSIS — M47816 Spondylosis without myelopathy or radiculopathy, lumbar region: Secondary | ICD-10-CM | POA: Diagnosis not present

## 2023-11-23 DIAGNOSIS — M4316 Spondylolisthesis, lumbar region: Secondary | ICD-10-CM

## 2023-11-23 DIAGNOSIS — M545 Low back pain, unspecified: Secondary | ICD-10-CM | POA: Diagnosis not present

## 2023-11-23 DIAGNOSIS — M4726 Other spondylosis with radiculopathy, lumbar region: Secondary | ICD-10-CM

## 2023-11-23 DIAGNOSIS — I700 Atherosclerosis of aorta: Secondary | ICD-10-CM | POA: Diagnosis not present

## 2023-11-23 NOTE — Patient Instructions (Signed)
 It was so nice to see you today. Thank you so much for coming in.    You have some wear and tear in your back with a slip at L4-L5. You have spinal stenosis from L2-L5 and this is likely what is causing the symptoms in your legs.   I ordered xrays of your lower back. You can get these at [facility] (building with the white pillars) off of [address]. The address is [address], [place], [place] [address]. You do not need any appointment. I will message you with results.   I want you to see Dr. Clois to discuss any possible surgery options. He may also want you to revisit lumbar injections with Dr. Avanell.   Please do not hesitate to call if you have any questions or concerns. You can also message me in MyChart.   Glade Boys PA-C [phone]     The physicians and staff at [facility] at [facility] are committed to providing excellent care. You may receive a survey asking for feedback about your experience at our office. We value you your feedback and appreciate you taking the time to to fill it out. The [facility] leadership team is also available to discuss your experience in person, feel free to contact us  [phone].

## 2023-12-01 NOTE — Progress Notes (Signed)
 Referring Physician:  Fernande Ophelia JINNY DOUGLAS, MD [address] [facility] [place],  [place] [address]  Primary Physician:  Fernande Ophelia JINNY DOUGLAS, MD  History of Present Illness: 12/07/2023 Mary Elliott presents with ongoing symptoms of what sounds like some discomfort in her legs when she stands or walks for period of time.  She also gets some back pain.  She is dealing with longstanding urologic concerns.  She is having an InterStim trial tomorrow.   She has tried and failed conservative management with physical therapy.  11/23/2023 Note from Glade Boys, PA-C Mary. Janese Radabaugh has a history of hypothyroidism, neuropathy, hyperlipidemia, gastric ulcers, diverticulitis, HTN.    She is a poor historian.    She got up off the toilet in June and felt weakness in both legs- felt like legs were going to give way. This improved after a few seconds. This has happened multiple times and has started happening when she is walking.    She does not have any significant back pain. With any prolonged standing or walking, she has weakness and heaviness in both legs. Legs feel like noodles. This improves when she stops and sits down. She does better with a grocery cart.    No NSAIDs due to history of gastric ulcers. She felt like PT made her stronger.    Tobacco use: Does not smoke.    Bowel/Bladder Dysfunction: she notes bladder incontinence x years. She notes some occasional bowel incontinence x years- she does not always feel the urge to go. She's had bowel issues since hemorrhoidectomy at age 51.    Conservative measures:  Physical therapy:  has participated in physical therapy with [facility] 11/2022-05/2023 Multimodal medical therapy including regular antiinflammatories: none Injections: 12/31/2019: Left L5-S1 and left S1 transforaminal ESI 09/05/2012: Left sacroiliac joint injection    Past Surgery: no spine surgery  Nyazia Poyser has no symptoms of cervical myelopathy.  The  symptoms are causing a significant impact on the patient's life.   I have utilized the care everywhere function in epic to review the outside records available from external health systems.  Review of Systems:  A 10 point review of systems is negative, except for the pertinent positives and negatives detailed in the HPI.  Past Medical History: Past Medical History:  Diagnosis Date   Anemia    Anxiety    Arthritis    fingers   Cancer (HCC) 1999   basil cell on face   GERD (gastroesophageal reflux disease)    history of   History of kidney stones    twice   HOH (hard of hearing)    Hypertension    Hypothyroidism    PONV (postoperative nausea and vomiting)    nausea only with 2 prior surgeries   Tinnitus     Past Surgical History: Past Surgical History:  Procedure Laterality Date   ABDOMINAL HYSTERECTOMY     basil cell     face   BLEPHAROPLASTY     CATARACT EXTRACTION W/PHACO Left 08/26/2020   Procedure: CATARACT EXTRACTION PHACO AND INTRAOCULAR LENS PLACEMENT (IOC) LEFT PANOPTIX TORIC;  Surgeon: Mittie Gaskin, MD;  Location: [facility];  Service: Ophthalmology;  Laterality: Left;  8.88 1.:27.2 10.1%   CATARACT EXTRACTION W/PHACO Right 09/09/2020   Procedure: CATARACT EXTRACTION PHACO AND INTRAOCULAR LENS PLACEMENT (IOC) RIGHT PANOPTIX TORIC 8.54 01:03.3 13.5%;  Surgeon: Mittie Gaskin, MD;  Location: [facility];  Service: Ophthalmology;  Laterality: Right;   CYSTOCELE REPAIR  2005  HEMORROIDECTOMY     JOINT REPLACEMENT Bilateral 11/2016   KNEE ARTHROPLASTY Left 11/14/2016   Procedure: COMPUTER ASSISTED TOTAL KNEE ARTHROPLASTY;  Surgeon: Mardee Lynwood SQUIBB, MD;  Location: [facility];  Service: Orthopedics;  Laterality: Left;   KNEE ARTHROPLASTY Right 03/15/2017   Procedure: COMPUTER ASSISTED TOTAL KNEE ARTHROPLASTY;  Surgeon: Mardee Lynwood SQUIBB, MD;  Location: [facility];  Service: Orthopedics;  Laterality: Right;   KNEE ARTHROSCOPY Left    LIPOSUCTION      PELVIC FLOOR REPAIR     SHOULDER ARTHROSCOPY WITH DEBRIDEMENT AND BICEP TENDON REPAIR Left 09/24/2019   Procedure: Left shoulder arthroscopy with debridement, decompression, rotator cuff repair, and possible biceps tenodesis.;  Surgeon: Edie Norleen PARAS, MD;  Location: [facility];  Service: Orthopedics;  Laterality: Left;   TOE SURGERY Right    TONSILLECTOMY     VARICOSE VEIN SURGERY      Allergies: Allergies as of 12/07/2023 - Review Complete 12/07/2023  Allergen Reaction Noted   Clindamycin Itching 07/24/2023   Clindamycin/lincomycin Hives 01/11/2017   Nitrofurantoin Hives 01/03/2013   Ciprofloxacin  Other (See Comments) 04/11/2023   Nickel Itching 07/24/2023   Amlodipine Other (See Comments) 07/03/2017   Amoxicillin  Itching 07/09/2018   Duloxetine Other (See Comments) 01/03/2013   Nsaids Other (See Comments) 01/03/2013   Oxybutynin Hives, Rash, and Other (See Comments) 01/03/2013   Sulfa antibiotics Other (See Comments) and Diarrhea 01/03/2013   Tegaserod Other (See Comments) 07/23/2013   Ultram  [tramadol ] Itching 03/02/2017   Vesicare [solifenacin succinate] Other (See Comments) 11/02/2016    Medications:  Current Outpatient Medications:    busPIRone (BUSPAR) 5 MG tablet, Take 5 mg by mouth 2 (two) times daily., Disp: , Rfl:    Cholecalciferol  (VITAMIN D3) 2000 units capsule, Take 2,000 Units by mouth daily., Disp: , Rfl:    estradiol  (ESTRACE ) 0.1 MG/GM vaginal cream, Place 1 Applicatorful vaginally 2 (two) times a week. , Disp: , Rfl:    levothyroxine  (SYNTHROID , LEVOTHROID) 100 MCG tablet, Take 100 mcg by mouth daily before breakfast., Disp: , Rfl:    loratadine  (CLARITIN ) 10 MG tablet, Take 10 mg by mouth daily as needed for allergies., Disp: , Rfl:    metoprolol  succinate (TOPROL -XL) 100 MG 24 hr tablet, Take 100 mg by mouth daily. , Disp: , Rfl:    metroNIDAZOLE  (METROCREAM ) 0.75 % cream, Apply 1 application topically 2 (two) times daily as needed (rosacea.). , Disp: , Rfl:     Probiotic Product (PROBIOTIC PO), Take 1 capsule by mouth daily after lunch., Disp: , Rfl:    sertraline  (ZOLOFT ) 100 MG tablet, Take 100 mg by mouth daily., Disp: , Rfl:    vitamin B-12 (CYANOCOBALAMIN ) 1000 MCG tablet, Take 1,000 mcg by mouth daily., Disp: , Rfl:    mirabegron  ER (MYRBETRIQ ) 25 MG TB24 tablet, Take 1 tablet (25 mg total) by mouth daily., Disp: 30 tablet, Rfl: 11  Social History: Social History   Tobacco Use   Smoking status: Former    Current packs/day: 0.00    Types: Cigarettes    Quit date: 03/02/1968    Years since quitting: 55.8   Smokeless tobacco: Never  Vaping Use   Vaping status: Never Used  Substance Use Topics   Alcohol  use: Yes    Alcohol /week: 4.0 standard drinks of alcohol     Types: 2 Glasses of wine, 2 Cans of beer per week    Comment: occasionally   Drug use: No    Family Medical History: Family History  Problem Relation Age of Onset  Breast cancer Neg Hx     Physical Examination: Vitals:   12/07/23 1258  BP: 136/86    General: Patient is in no apparent distress. Attention to examination is appropriate.  Neck:   Supple.  Full range of motion.  Respiratory: Patient is breathing without any difficulty.   NEUROLOGICAL:     Awake, alert, oriented to person, place, and time.  Speech is clear and fluent.   Cranial Nerves: Pupils equal round and reactive to light.  Facial tone is symmetric.  Facial sensation is symmetric. Shoulder shrug is symmetric. Tongue protrusion is midline.  There is no pronator drift.  Strength: Side Biceps Triceps Deltoid Interossei Grip Wrist Ext. Wrist Flex.  R 5 5 5 5 5 5 5   L 5 5 5 5 5 5 5    Side Iliopsoas Quads Hamstring PF DF EHL  R 5 5 5 5 5 5   L 5 5 5 5 5 5    Reflexes are 1+ and symmetric at the biceps, triceps, brachioradialis, patella and achilles.   Hoffman's is absent.   Bilateral upper and lower extremity sensation is intact to light touch.    No evidence of dysmetria noted.  Gait is  abnormal and requires a cane.     Medical Decision Making  Imaging: MRI L spine 10/31/2023 IMPRESSION: 1. Compared with previous MRI from 08/28/2019, there is progressive multifactorial spinal stenosis at L2-3 and L4-5, now moderate at both levels. 2. There is asymmetric narrowing of the left lateral recess at L2-3 with probable left L3 nerve root impingement in the canal. Moderate lateral recess and foraminal narrowing bilaterally at L4-5. 3. Mild multifactorial spinal stenosis at L3-4 without nerve root impingement. 4. Stable chronic asymmetric narrowing of the right lateral recess at L5-S1. 5. No acute findings demonstrated.     Electronically Signed   By: Elsie Perone M.D.   On: 11/09/2023 14:10  I have personally reviewed the images and agree with the above interpretation.  Assessment and Plan: Mary. Jorgenson is a pleasant 83 y.o. female with lumbar stenosis at L2-3 and L4-5 with spondylolisthesis at L4-5.  She has had spondylolisthesis at level for 20 years.  I think she might have neurogenic claudication, but she is not good at putting her finger on her symptoms.  She is having InterStim trial tomorrow.  I would like to see her back in 2 months after her device is placed.  I spent a total of 30 minutes in this patient's care today. This time was spent reviewing pertinent records including imaging studies, obtaining and confirming history, performing a directed evaluation, formulating and discussing my recommendations, and documenting the visit within the medical record.     Thank you for involving me in the care of this patient.      Presleigh Feldstein K. Clois MD, [facility]

## 2023-12-07 ENCOUNTER — Ambulatory Visit: Admitting: Neurosurgery

## 2023-12-07 ENCOUNTER — Encounter: Payer: Self-pay | Admitting: Neurosurgery

## 2023-12-07 VITALS — BP 136/86 | Ht 61.0 in | Wt 189.0 lb

## 2023-12-07 DIAGNOSIS — M4316 Spondylolisthesis, lumbar region: Secondary | ICD-10-CM | POA: Diagnosis not present

## 2023-12-07 DIAGNOSIS — M48062 Spinal stenosis, lumbar region with neurogenic claudication: Secondary | ICD-10-CM | POA: Diagnosis not present

## 2023-12-08 DIAGNOSIS — N3941 Urge incontinence: Secondary | ICD-10-CM | POA: Diagnosis not present

## 2023-12-11 ENCOUNTER — Ambulatory Visit: Admitting: Urology

## 2023-12-15 DIAGNOSIS — N3941 Urge incontinence: Secondary | ICD-10-CM | POA: Diagnosis not present

## 2024-01-10 ENCOUNTER — Encounter: Payer: Self-pay | Admitting: Neurosurgery

## 2024-01-19 DIAGNOSIS — N3941 Urge incontinence: Secondary | ICD-10-CM | POA: Diagnosis not present

## 2024-01-23 ENCOUNTER — Other Ambulatory Visit: Payer: Self-pay | Admitting: Internal Medicine

## 2024-01-23 DIAGNOSIS — Z1231 Encounter for screening mammogram for malignant neoplasm of breast: Secondary | ICD-10-CM

## 2024-01-25 DIAGNOSIS — N3941 Urge incontinence: Secondary | ICD-10-CM | POA: Diagnosis not present

## 2024-01-25 DIAGNOSIS — R350 Frequency of micturition: Secondary | ICD-10-CM | POA: Diagnosis not present

## 2024-01-31 DIAGNOSIS — N3941 Urge incontinence: Secondary | ICD-10-CM | POA: Diagnosis not present

## 2024-01-31 DIAGNOSIS — R350 Frequency of micturition: Secondary | ICD-10-CM | POA: Diagnosis not present

## 2024-02-01 ENCOUNTER — Other Ambulatory Visit: Payer: Self-pay

## 2024-02-01 ENCOUNTER — Emergency Department
Admission: EM | Admit: 2024-02-01 | Discharge: 2024-02-01 | Disposition: A | Discharge status: Home / Self Care | Attending: Emergency Medicine | Admitting: Emergency Medicine

## 2024-02-01 ENCOUNTER — Emergency Department

## 2024-02-01 DIAGNOSIS — R42 Dizziness and giddiness: Secondary | ICD-10-CM | POA: Diagnosis not present

## 2024-02-01 DIAGNOSIS — W19XXXA Unspecified fall, initial encounter: Secondary | ICD-10-CM | POA: Diagnosis not present

## 2024-02-01 DIAGNOSIS — S0990XA Unspecified injury of head, initial encounter: Secondary | ICD-10-CM | POA: Diagnosis not present

## 2024-02-01 DIAGNOSIS — R110 Nausea: Secondary | ICD-10-CM | POA: Diagnosis not present

## 2024-02-01 DIAGNOSIS — S199XXA Unspecified injury of neck, initial encounter: Secondary | ICD-10-CM | POA: Diagnosis not present

## 2024-02-01 DIAGNOSIS — Y92009 Unspecified place in unspecified non-institutional (private) residence as the place of occurrence of the external cause: Secondary | ICD-10-CM | POA: Diagnosis not present

## 2024-02-01 DIAGNOSIS — I6782 Cerebral ischemia: Secondary | ICD-10-CM | POA: Diagnosis not present

## 2024-02-01 DIAGNOSIS — I10 Essential (primary) hypertension: Secondary | ICD-10-CM | POA: Diagnosis not present

## 2024-02-01 LAB — COMPREHENSIVE METABOLIC PANEL WITH GFR
ALT: 12 U/L (ref 0–44)
AST: 20 U/L (ref 15–41)
Albumin: 4.5 g/dL (ref 3.5–5.0)
Alkaline Phosphatase: 67 U/L (ref 38–126)
Anion gap: 12 (ref 5–15)
BUN: 24 mg/dL — ABNORMAL HIGH (ref 8–23)
CO2: 22 mmol/L (ref 22–32)
Calcium: 9.2 mg/dL (ref 8.9–10.3)
Chloride: 104 mmol/L (ref 98–111)
Creatinine, Ser: 0.83 mg/dL (ref 0.44–1.00)
GFR, Estimated: >60 mL/min (ref >60)
Glucose, Bld: 103 mg/dL — ABNORMAL HIGH (ref 70–99)
Potassium: 4.1 mmol/L (ref 3.5–5.1)
Sodium: 138 mmol/L (ref 135–145)
Total Bilirubin: 0.8 mg/dL (ref 0.0–1.2)
Total Protein: 7.8 g/dL (ref 6.5–8.1)

## 2024-02-01 LAB — URINALYSIS, ROUTINE W REFLEX MICROSCOPIC
Bilirubin Urine: NEGATIVE
Glucose, UA: NEGATIVE mg/dL
Hgb urine dipstick: NEGATIVE
Ketones, ur: NEGATIVE mg/dL
Leukocytes,Ua: NEGATIVE
Nitrite: NEGATIVE
Protein, ur: NEGATIVE mg/dL
Specific Gravity, Urine: 1.012 (ref 1.005–1.030)
pH: 6 (ref 5.0–8.0)

## 2024-02-01 LAB — CBC
HCT: 41.7 % (ref 36.0–46.0)
Hemoglobin: 13.8 g/dL (ref 12.0–15.0)
MCH: 30.7 pg (ref 26.0–34.0)
MCHC: 33.1 g/dL (ref 30.0–36.0)
MCV: 92.7 fL (ref 80.0–100.0)
Platelets: 297 K/uL (ref 150–400)
RBC: 4.5 MIL/uL (ref 3.87–5.11)
RDW: 13.8 % (ref 11.5–15.5)
WBC: 7.1 K/uL (ref 4.0–10.5)
nRBC: 0 % (ref 0.0–0.2)

## 2024-02-01 MED ORDER — MECLIZINE HCL 25 MG PO TABS: 25.0000 mg | ORAL_TABLET | Freq: Three times a day (TID) | ORAL | 0 refills | Status: AC | PRN

## 2024-02-01 MED ORDER — MECLIZINE HCL 25 MG PO TABS
25.0000 mg | ORAL_TABLET | Freq: Once | ORAL | Status: AC
  Administered 2024-02-01: 25 mg via ORAL
  Filled 2024-02-01: qty 1

## 2024-02-01 MED ORDER — ONDANSETRON HCL 4 MG PO TABS: 4.0000 mg | ORAL_TABLET | Freq: Four times a day (QID) | ORAL | 0 refills | Status: AC | PRN

## 2024-02-01 MED ORDER — ONDANSETRON 4 MG PO TBDP
4.0000 mg | ORAL_TABLET | Freq: Once | ORAL | Status: AC
  Administered 2024-02-01: 4 mg via ORAL
  Filled 2024-02-01: qty 1

## 2024-02-01 NOTE — Discharge Instructions (Signed)
 You were seen in the ER today for your dizziness.  We fortunately did not and emergency cause for this.  I suspect this is related to vertigo.  I sent a prescription for a medication to help with this to your pharmacy as well as a nausea medication.  Keep her scheduled follow-up with ENT.  Return to the ER for new or worsening symptoms including severe headache, new numbness, tingling, focal weakness, or any other new or concerning symptoms.

## 2024-02-01 NOTE — ED Provider Notes (Signed)
 [facility] Provider Note    Event Date/Time   First MD Initiated Contact with Patient 02/01/24 1805     (approximate)   History   Dizziness   HPI  Mary Elliott is a 83 year old female presenting to the emergency department for evaluation of dizziness.  When patient woke up this morning she noticed that she had dizziness described as a spinning sensation.  This is worse with positional changes.  Says she has had similar symptoms in the past that have improved with an Epley maneuver.  She made an appointment for ENT on Tuesday, but given her persistent symptoms presented to urgent care.  There, they obtained additional history that patient had had a fall on Friday and were concerned that her fall may be related to her dizziness today.  She thinks she may have struck her head when she fell, denies chest pain, shortness of breath, numbness, tingling, palpitations.  Patient currently reports improved vertigo symptoms, but says that it can be triggered with sudden head movements.     Physical Exam   Triage Vital Signs: ED Triage Vitals  Encounter Vitals Group     BP 02/01/24 1655 (!) 185/94     Girls Systolic BP Percentile --      Girls Diastolic BP Percentile --      Boys Systolic BP Percentile --      Boys Diastolic BP Percentile --      Pulse Rate 02/01/24 1655 (!) 52     Resp 02/01/24 1655 18     Temp 02/01/24 1655 97.9 F (36.6 C)     Temp Source 02/01/24 1850 Oral     SpO2 02/01/24 1655 95 %     Weight 02/01/24 1700 190 lb (86.2 kg)     Height 02/01/24 1700 5' 1 (1.549 m)     Head Circumference --      Peak Flow --      Pain Score 02/01/24 1659 0     Pain Loc --      Pain Education --      Exclude from Growth Chart --     Most recent vital signs: Vitals:   02/01/24 1655 02/01/24 1850  BP: (!) 185/94 (!) 176/80  Pulse: (!) 52 (!) 50  Resp: 18 17  Temp: 97.9 F (36.6 C) 98.2 F (36.8 C)  SpO2: 95% 97%     General: Awake,  interactive  CV:  Bradycardic, consistent with prior records in our system, good peripheral perfusion Resp:  Unlabored respirations.  Abd:  Nondistended.  Neuro:  Alert and oriented, normal extraocular movements, symmetric facial movement, sensation intact over bilateral upper and lower extremities with 5 out of 5 strength.  Normal finger-to-nose testing.  No field cut, no nystagmus.   ED Results / Procedures / Treatments   Labs (all labs ordered are listed, but only abnormal results are displayed) Labs Reviewed  COMPREHENSIVE METABOLIC PANEL WITH GFR - Abnormal; Notable for the following components:      Result Value   Glucose, Bld 103 (*)    BUN 24 (*)    All other components within normal limits  URINALYSIS, ROUTINE W REFLEX MICROSCOPIC - Abnormal; Notable for the following components:   Color, Urine YELLOW (*)    APPearance CLEAR (*)    All other components within normal limits  CBC     EKG EKG independently reviewed and interpreted by myself demonstrates:  EKG demonstrates sinus bradycardia at a rate of 48, PR  222, QRS 82, QTc 393, no acute ST changes  RADIOLOGY Imaging independently reviewed and interpreted by myself demonstrates:  CT head without acute bleed CT C-spine without acute fracture  Formal Radiology Read:  CT Head Wo Contrast Result Date: 02/01/2024 CLINICAL DATA:  Head trauma, minor (Age >= 65y); Neck trauma (Age >= 65y). Dizziness. Fall last week. Neck pain. EXAM: CT HEAD WITHOUT CONTRAST CT CERVICAL SPINE WITHOUT CONTRAST TECHNIQUE: Multidetector CT imaging of the head and cervical spine was performed following the standard protocol without intravenous contrast. Multiplanar CT image reconstructions of the cervical spine were also generated. RADIATION DOSE REDUCTION: This exam was performed according to the departmental dose-optimization program which includes automated exposure control, adjustment of the mA and/or kV according to patient size and/or use of  iterative reconstruction technique. COMPARISON:  None Available. FINDINGS: CT HEAD FINDINGS Brain: There is no evidence of an acute infarct, intracranial hemorrhage, mass, midline shift, or extra-axial fluid collection. There is mild-to-moderate cerebral atrophy. Confluent cerebral white matter hypodensities are nonspecific but compatible with severe chronic small vessel ischemic disease. Vascular: Calcified atherosclerosis at the skull base. No hyperdense vessel. Skull: No fracture or suspicious lesion. Sinuses/Orbits: Visualized paranasal sinuses and mastoid air cells are clear. Bilateral cataract extraction. Other: None. CT CERVICAL SPINE FINDINGS Alignment: Straightening of the normal cervical lordosis. No significant listhesis. Mild left convex curvature of the lower cervical spine. Skull base and vertebrae: No acute fracture or suspicious lesion. Soft tissues and spinal canal: No prevertebral fluid or swelling. No visible canal hematoma. Disc levels: Degenerative endplate changes and disc space narrowing from C3-C7, most severe at C6-7. Suspected mild multilevel spinal stenosis and up to moderate neural foraminal stenosis. Upper chest: The included lung apices are clear. Other: None. IMPRESSION: 1. No evidence of acute intracranial abnormality. 2. Severe chronic small vessel ischemic disease. 3. No acute cervical spine fracture. Electronically Signed   By: Dasie Hamburg M.D.   On: 02/01/2024 18:31   CT Cervical Spine Wo Contrast Result Date: 02/01/2024 CLINICAL DATA:  Head trauma, minor (Age >= 65y); Neck trauma (Age >= 65y). Dizziness. Fall last week. Neck pain. EXAM: CT HEAD WITHOUT CONTRAST CT CERVICAL SPINE WITHOUT CONTRAST TECHNIQUE: Multidetector CT imaging of the head and cervical spine was performed following the standard protocol without intravenous contrast. Multiplanar CT image reconstructions of the cervical spine were also generated. RADIATION DOSE REDUCTION: This exam was performed according to  the departmental dose-optimization program which includes automated exposure control, adjustment of the mA and/or kV according to patient size and/or use of iterative reconstruction technique. COMPARISON:  None Available. FINDINGS: CT HEAD FINDINGS Brain: There is no evidence of an acute infarct, intracranial hemorrhage, mass, midline shift, or extra-axial fluid collection. There is mild-to-moderate cerebral atrophy. Confluent cerebral white matter hypodensities are nonspecific but compatible with severe chronic small vessel ischemic disease. Vascular: Calcified atherosclerosis at the skull base. No hyperdense vessel. Skull: No fracture or suspicious lesion. Sinuses/Orbits: Visualized paranasal sinuses and mastoid air cells are clear. Bilateral cataract extraction. Other: None. CT CERVICAL SPINE FINDINGS Alignment: Straightening of the normal cervical lordosis. No significant listhesis. Mild left convex curvature of the lower cervical spine. Skull base and vertebrae: No acute fracture or suspicious lesion. Soft tissues and spinal canal: No prevertebral fluid or swelling. No visible canal hematoma. Disc levels: Degenerative endplate changes and disc space narrowing from C3-C7, most severe at C6-7. Suspected mild multilevel spinal stenosis and up to moderate neural foraminal stenosis. Upper chest: The included lung apices are clear. Other:  None. IMPRESSION: 1. No evidence of acute intracranial abnormality. 2. Severe chronic small vessel ischemic disease. 3. No acute cervical spine fracture. Electronically Signed   By: Dasie Hamburg M.D.   On: 02/01/2024 18:31    PROCEDURES:  Critical Care performed: No  Procedures   MEDICATIONS ORDERED IN ED: Medications  meclizine  (ANTIVERT ) tablet 25 mg (25 mg Oral Given 02/01/24 1849)  ondansetron  (ZOFRAN -ODT) disintegrating tablet 4 mg (4 mg Oral Given 02/01/24 1849)     IMPRESSION / MDM / ASSESSMENT AND PLAN / ED COURSE  I reviewed the triage vital signs and the  nursing notes.  Differential diagnosis includes, but is not limited to: peripheral vertigo including BPPV, lower suspicion central process such as CVA, intracranial bleed  Patient's presentation is most consistent with acute presentation with potential threat to life or bodily function.  Patient presents with vertigo symptoms are that intermittent and reproducible consistent with a peripheral etiology.  Presentation most consistent with peripheral vertigo such as BPPV.  She did have a fall a few days ago, so CT head and C-spine were ordered which were fortunately without traumatic injuries.  Labs ordered from triage with reassuring CBC, CMP, negative UA. Will trial meclizine  and Zofran  and reevaluate.   On reevaluation, patient for significant improvement in her symptoms.  She is comfortable with discharge home and outpatient follow-up.  Very low suspicion central process.  Will DC with prescriptions for meclizine  and Zofran .  Strict return precautions provided.  Patient discharged stable condition.     FINAL CLINICAL IMPRESSION(S) / ED DIAGNOSES   Final diagnoses:  Vertigo     Rx / DC Orders   ED Discharge Orders          Ordered    meclizine  (ANTIVERT ) 25 MG tablet  3 times daily PRN        02/01/24 2001    ondansetron  (ZOFRAN ) 4 MG tablet  Every 6 hours PRN        02/01/24 2001             Note:  This document was prepared using Dragon voice recognition software and may include unintentional dictation errors.   Levander Slate, MD 02/01/24 2001

## 2024-02-01 NOTE — ED Triage Notes (Addendum)
 Pt to ED from [facility] for dizziness started today. Hx vertigo. Reports trip and fall on Friday, states hit head, no blood thinner use. +neck pain

## 2024-02-02 ENCOUNTER — Emergency Department
Admission: EM | Admit: 2024-02-02 | Discharge: 2024-02-02 | Disposition: A | Discharge status: Home / Self Care | Attending: Emergency Medicine | Admitting: Emergency Medicine

## 2024-02-02 ENCOUNTER — Other Ambulatory Visit: Payer: Self-pay

## 2024-02-02 DIAGNOSIS — S01112A Laceration without foreign body of left eyelid and periocular area, initial encounter: Secondary | ICD-10-CM | POA: Diagnosis not present

## 2024-02-02 DIAGNOSIS — W19XXXA Unspecified fall, initial encounter: Secondary | ICD-10-CM | POA: Insufficient documentation

## 2024-02-02 DIAGNOSIS — S0181XA Laceration without foreign body of other part of head, initial encounter: Secondary | ICD-10-CM

## 2024-02-02 DIAGNOSIS — S0993XA Unspecified injury of face, initial encounter: Secondary | ICD-10-CM | POA: Diagnosis present

## 2024-02-02 MED ORDER — LIDOCAINE HCL (PF) 1 % IJ SOLN: 5.0000 mL | Freq: Once | INTRAMUSCULAR | Status: DC

## 2024-02-02 MED ORDER — LIDOCAINE-EPINEPHRINE-TETRACAINE (LET) TOPICAL GEL
3.0000 mL | Freq: Once | TOPICAL | Status: AC
Start: 2024-02-02 — End: 2024-02-02
  Administered 2024-02-02: 3 mL via TOPICAL
  Filled 2024-02-02: qty 3

## 2024-02-02 NOTE — ED Triage Notes (Signed)
 POV from home from a laceration to the left side of face after a fall. Patient states she bent over to get something out of a cabinet and fell over. - blood thinners, - LOC. Bleeding well controlled. Denies any dizziness, N/V.

## 2024-02-02 NOTE — ED Notes (Signed)
 Pt provided discharge instructions and prescription information. Pt was given the opportunity to ask questions and questions were answered.

## 2024-02-02 NOTE — Discharge Instructions (Signed)
 Return to ED or follow-up with your primary care provider in 7 to 10 days for suture removal.    Keep sutures dry for first 24 hrs. Keep suture site of clean & dry. Gently use soap & water after first 24 hrs. DO NOT USE alcohol , hydrogen peroxide etc, to clean skin. You may cover the incision with clean gauze & replace it after your daily shower for your comfort. If you have skin tapes (Steristrips) or skin glue (Dermabond) on your incision, leave them in place. They will fall off on their own like a scab in a few weeks.  You may trim any edges that curl up with clean scissors.

## 2024-02-02 NOTE — ED Provider Notes (Signed)
 [facility] Emergency Department Provider Note     Event Date/Time   First MD Initiated Contact with Patient 02/02/24 2034     (approximate)   History   Laceration   HPI  Mary Elliott is a 83 y.o. female presents to the ED for evaluation of a small laceration to left side of her face after bending over to get something out of her cabinet and falling into the corner of the cabinet. No LOC. Bleeding is controlled. No eye involvement. Denies blood thinners.     Physical Exam   Triage Vital Signs: ED Triage Vitals  Encounter Vitals Group     BP 02/02/24 1930 (!) 168/92     Girls Systolic BP Percentile --      Girls Diastolic BP Percentile --      Boys Systolic BP Percentile --      Boys Diastolic BP Percentile --      Pulse Rate 02/02/24 1930 (!) 51     Resp 02/02/24 1930 16     Temp 02/02/24 1930 98.1 F (36.7 C)     Temp Source 02/02/24 1930 Oral     SpO2 02/02/24 1930 99 %     Weight 02/02/24 1929 190 lb 0.6 oz (86.2 kg)     Height 02/02/24 1929 5' 1 (1.549 m)     Head Circumference --      Peak Flow --      Pain Score 02/02/24 1929 3     Pain Loc --      Pain Education --      Exclude from Growth Chart --     Most recent vital signs: Vitals:   02/02/24 1930  BP: (!) 168/92  Pulse: (!) 51  Resp: 16  Temp: 98.1 F (36.7 C)  SpO2: 99%    General Awake, no distress.  HEENT NCAT. PERRL. EOMI.  CV:  Good peripheral perfusion.  RESP:  Normal effort.  ABD:  No distention.  Other:  Laceration approximately 2 cm to left lateral orbital rim.Mild surrounding ecchymosis.   ED Results / Procedures / Treatments   Labs (all labs ordered are listed, but only abnormal results are displayed) Labs Reviewed - No data to display  No results found.  PROCEDURES:  Critical Care performed: No  .Laceration Repair  Date/Time: 02/03/2024 2:01 AM  Performed by: Margrette Rebbeca LABOR, PA-C Authorized by: Margrette Rebbeca LABOR, PA-C   Consent:     Consent obtained:  Verbal   Consent given by:  Patient   Risks discussed:  Infection, pain, poor cosmetic result, poor wound healing and need for additional repair Universal protocol:    Patient identity confirmed:  Verbally with patient Anesthesia:    Anesthesia method:  Topical application   Topical anesthetic:  LET Laceration details:    Location:  Face   Face location:  L eyebrow   Length (cm):  2   Depth (mm):  1 Treatment:    Area cleansed with:  Povidone-iodine and saline   Amount of cleaning:  Standard   Irrigation solution:  Sterile saline Skin repair:    Repair method:  Sutures   Suture size:  5-0   Suture material:  Nylon   Suture technique:  Simple interrupted   Number of sutures:  5 Approximation:    Approximation:  Close Repair type:    Repair type:  Simple Post-procedure details:    Dressing:  Open (no dressing)  MEDICATIONS ORDERED IN ED: Medications  lidocaine -EPINEPHrine -tetracaine  (LET) topical gel (3 mLs Topical Given 02/02/24 2104)    IMPRESSION / MDM / ASSESSMENT AND PLAN / ED COURSE  I reviewed the triage vital signs and the nursing notes.                               83 y.o. female presents to the emergency department for evaluation and treatment of laceration. See HPI for further details.   Differential diagnosis includes, but is not limited to laceration, abrasion, contusion   Patient's presentation is most consistent with acute, uncomplicated illness.  No indication for imaging at this time. Patient tolerated laceration repair well. Advised to follow up with PCP or returning to ED in 7-10 for suture removal. She verbalized understanding. She is stable for discharge home and outpatient management as nerded.   FINAL CLINICAL IMPRESSION(S) / ED DIAGNOSES   Final diagnoses:  Facial laceration, initial encounter   Rx / DC Orders   ED Discharge Orders     None        Note:  This document was prepared using Dragon voice  recognition software and may include unintentional dictation errors.    Margrette, Kaniesha Barile A, PA-C 02/03/24 HARDY    Dicky Anes, MD 02/04/24 [id number]

## 2024-02-06 ENCOUNTER — Ambulatory Visit: Admitting: Neurosurgery

## 2024-02-06 DIAGNOSIS — H903 Sensorineural hearing loss, bilateral: Secondary | ICD-10-CM | POA: Diagnosis not present

## 2024-02-06 DIAGNOSIS — F411 Generalized anxiety disorder: Secondary | ICD-10-CM | POA: Diagnosis not present

## 2024-02-06 DIAGNOSIS — R42 Dizziness and giddiness: Secondary | ICD-10-CM | POA: Diagnosis not present

## 2024-02-06 DIAGNOSIS — F410 Panic disorder [episodic paroxysmal anxiety] without agoraphobia: Secondary | ICD-10-CM | POA: Diagnosis not present

## 2024-02-06 DIAGNOSIS — F3342 Major depressive disorder, recurrent, in full remission: Secondary | ICD-10-CM | POA: Diagnosis not present

## 2024-02-09 ENCOUNTER — Other Ambulatory Visit: Payer: Self-pay | Admitting: Physician Assistant

## 2024-02-09 ENCOUNTER — Ambulatory Visit
Admission: RE | Admit: 2024-02-09 | Discharge: 2024-02-09 | Disposition: A | Discharge status: Home / Self Care | Source: Ambulatory Visit | Attending: Physician Assistant | Admitting: Physician Assistant

## 2024-02-09 DIAGNOSIS — G510 Bell's palsy: Secondary | ICD-10-CM

## 2024-02-09 DIAGNOSIS — Z4802 Encounter for removal of sutures: Secondary | ICD-10-CM | POA: Insufficient documentation

## 2024-02-09 DIAGNOSIS — S0181XD Laceration without foreign body of other part of head, subsequent encounter: Secondary | ICD-10-CM | POA: Diagnosis not present

## 2024-02-09 DIAGNOSIS — S0993XD Unspecified injury of face, subsequent encounter: Secondary | ICD-10-CM | POA: Diagnosis not present

## 2024-02-09 DIAGNOSIS — S0993XS Unspecified injury of face, sequela: Secondary | ICD-10-CM | POA: Insufficient documentation

## 2024-02-09 DIAGNOSIS — R9082 White matter disease, unspecified: Secondary | ICD-10-CM | POA: Diagnosis not present

## 2024-02-20 DIAGNOSIS — N399 Disorder of urinary system, unspecified: Secondary | ICD-10-CM | POA: Diagnosis not present

## 2024-02-20 DIAGNOSIS — M5489 Other dorsalgia: Secondary | ICD-10-CM | POA: Diagnosis not present

## 2024-03-07 ENCOUNTER — Encounter

## 2024-03-07 DIAGNOSIS — K219 Gastro-esophageal reflux disease without esophagitis: Secondary | ICD-10-CM | POA: Diagnosis not present

## 2024-03-07 DIAGNOSIS — J209 Acute bronchitis, unspecified: Secondary | ICD-10-CM | POA: Diagnosis not present

## 2024-03-07 DIAGNOSIS — Z03818 Encounter for observation for suspected exposure to other biological agents ruled out: Secondary | ICD-10-CM | POA: Diagnosis not present

## 2024-03-19 ENCOUNTER — Ambulatory Visit: Admitting: Neurosurgery

## 2024-03-19 ENCOUNTER — Encounter: Payer: Self-pay | Admitting: Neurosurgery

## 2024-03-19 VITALS — BP 120/60 | Ht 61.0 in | Wt 189.1 lb

## 2024-03-19 DIAGNOSIS — M4316 Spondylolisthesis, lumbar region: Secondary | ICD-10-CM

## 2024-03-19 DIAGNOSIS — M48062 Spinal stenosis, lumbar region with neurogenic claudication: Secondary | ICD-10-CM

## 2024-03-19 NOTE — Progress Notes (Signed)
 Referring Physician:  Fernande Ophelia JINNY DOUGLAS, MD [address] [facility] [place],  [place] [address]  Primary Physician:  Fernande Ophelia JINNY DOUGLAS, MD  History of Present Illness: 03/19/2024 Mary Elliott is doing much better.  Her symptoms are currently not life-limiting.   12/07/2023 Mary Elliott presents with ongoing symptoms of what sounds like some discomfort in her legs when she stands or walks for period of time.  She also gets some back pain.  She is dealing with longstanding urologic concerns.  She is having an InterStim trial tomorrow.   She has tried and failed conservative management with physical therapy.  11/23/2023 Note from Glade Boys, PA-C Mary. Arelis Neumeier has a history of hypothyroidism, neuropathy, hyperlipidemia, gastric ulcers, diverticulitis, HTN.    She is a poor historian.    She got up off the toilet in June and felt weakness in both legs- felt like legs were going to give way. This improved after a few seconds. This has happened multiple times and has started happening when she is walking.    She does not have any significant back pain. With any prolonged standing or walking, she has weakness and heaviness in both legs. Legs feel like noodles. This improves when she stops and sits down. She does better with a grocery cart.    No NSAIDs due to history of gastric ulcers. She felt like PT made her stronger.    Tobacco use: Does not smoke.    Bowel/Bladder Dysfunction: she notes bladder incontinence x years. She notes some occasional bowel incontinence x years- she does not always feel the urge to go. She's had bowel issues since hemorrhoidectomy at age 89.    Conservative measures:  Physical therapy:  has participated in physical therapy with [facility] 11/2022-05/2023 Multimodal medical therapy including regular antiinflammatories: none Injections: 12/31/2019: Left L5-S1 and left S1 transforaminal ESI 09/05/2012: Left sacroiliac joint injection    Past  Surgery: no spine surgery  Mary Elliott has no symptoms of cervical myelopathy.  The symptoms are causing a significant impact on the patient's life.   I have utilized the care everywhere function in epic to review the outside records available from external health systems.  Review of Systems:  A 10 point review of systems is negative, except for the pertinent positives and negatives detailed in the HPI.  Past Medical History: Past Medical History:  Diagnosis Date   Anemia    Anxiety    Arthritis    fingers   Cancer (HCC) 1999   basil cell on face   GERD (gastroesophageal reflux disease)    history of   History of kidney stones    twice   HOH (hard of hearing)    Hypertension    Hypothyroidism    PONV (postoperative nausea and vomiting)    nausea only with 2 prior surgeries   Tinnitus     Past Surgical History: Past Surgical History:  Procedure Laterality Date   ABDOMINAL HYSTERECTOMY     basil cell     face   BLEPHAROPLASTY     CATARACT EXTRACTION W/PHACO Left 08/26/2020   Procedure: CATARACT EXTRACTION PHACO AND INTRAOCULAR LENS PLACEMENT (IOC) LEFT PANOPTIX TORIC;  Surgeon: Mittie Gaskin, MD;  Location: [facility];  Service: Ophthalmology;  Laterality: Left;  8.88 1.:27.2 10.1%   CATARACT EXTRACTION W/PHACO Right 09/09/2020   Procedure: CATARACT EXTRACTION PHACO AND INTRAOCULAR LENS PLACEMENT (IOC) RIGHT PANOPTIX TORIC 8.54 01:03.3 13.5%;  Surgeon: Mittie Gaskin, MD;  Location: [facility];  Service: Ophthalmology;  Laterality: Right;   CYSTOCELE REPAIR  2005   HEMORROIDECTOMY     JOINT REPLACEMENT Bilateral 11/2016   KNEE ARTHROPLASTY Left 11/14/2016   Procedure: COMPUTER ASSISTED TOTAL KNEE ARTHROPLASTY;  Surgeon: Mardee Lynwood SQUIBB, MD;  Location: [facility];  Service: Orthopedics;  Laterality: Left;   KNEE ARTHROPLASTY Right 03/15/2017   Procedure: COMPUTER ASSISTED TOTAL KNEE ARTHROPLASTY;  Surgeon: Mardee Lynwood SQUIBB, MD;  Location:  [facility];  Service: Orthopedics;  Laterality: Right;   KNEE ARTHROSCOPY Left    LIPOSUCTION     PELVIC FLOOR REPAIR     SHOULDER ARTHROSCOPY WITH DEBRIDEMENT AND BICEP TENDON REPAIR Left 09/24/2019   Procedure: Left shoulder arthroscopy with debridement, decompression, rotator cuff repair, and possible biceps tenodesis.;  Surgeon: Edie Norleen PARAS, MD;  Location: [facility];  Service: Orthopedics;  Laterality: Left;   TOE SURGERY Right    TONSILLECTOMY     VARICOSE VEIN SURGERY      Allergies: Allergies as of 03/19/2024 - Review Complete 02/02/2024  Allergen Reaction Noted   Clindamycin Itching 07/24/2023   Clindamycin/lincomycin Hives 01/11/2017   Nitrofurantoin Hives 01/03/2013   Ciprofloxacin  Other (See Comments) 04/11/2023   Nickel Itching 07/24/2023   Amlodipine Other (See Comments) 07/03/2017   Amoxicillin  Itching 07/09/2018   Duloxetine Other (See Comments) 01/03/2013   Nsaids Other (See Comments) 01/03/2013   Oxybutynin Hives, Rash, and Other (See Comments) 01/03/2013   Sulfa antibiotics Other (See Comments) and Diarrhea 01/03/2013   Tegaserod Other (See Comments) 07/23/2013   Ultram  [tramadol ] Itching 03/02/2017   Vesicare [solifenacin succinate] Other (See Comments) 11/02/2016    Medications:  Current Outpatient Medications:    busPIRone (BUSPAR) 5 MG tablet, Take 5 mg by mouth 2 (two) times daily., Disp: , Rfl:    Cholecalciferol  (VITAMIN D3) 2000 units capsule, Take 2,000 Units by mouth daily., Disp: , Rfl:    estradiol  (ESTRACE ) 0.1 MG/GM vaginal cream, Place 1 Applicatorful vaginally 2 (two) times a week. , Disp: , Rfl:    levothyroxine  (SYNTHROID , LEVOTHROID) 100 MCG tablet, Take 100 mcg by mouth daily before breakfast., Disp: , Rfl:    loratadine  (CLARITIN ) 10 MG tablet, Take 10 mg by mouth daily as needed for allergies., Disp: , Rfl:    meclizine  (ANTIVERT ) 25 MG tablet, Take 1 tablet (25 mg total) by mouth 3 (three) times daily as needed for dizziness., Disp: 30  tablet, Rfl: 0   metoprolol  succinate (TOPROL -XL) 100 MG 24 hr tablet, Take 100 mg by mouth daily. , Disp: , Rfl:    metroNIDAZOLE  (METROCREAM ) 0.75 % cream, Apply 1 application topically 2 (two) times daily as needed (rosacea.). , Disp: , Rfl:    mirabegron  ER (MYRBETRIQ ) 25 MG TB24 tablet, Take 1 tablet (25 mg total) by mouth daily., Disp: 30 tablet, Rfl: 11   Probiotic Product (PROBIOTIC PO), Take 1 capsule by mouth daily after lunch., Disp: , Rfl:    sertraline  (ZOLOFT ) 100 MG tablet, Take 100 mg by mouth daily., Disp: , Rfl:    vitamin B-12 (CYANOCOBALAMIN ) 1000 MCG tablet, Take 1,000 mcg by mouth daily., Disp: , Rfl:   Social History: Social History   Tobacco Use   Smoking status: Former    Current packs/day: 0.00    Types: Cigarettes    Quit date: 03/02/1968    Years since quitting: 56.0   Smokeless tobacco: Never  Vaping Use   Vaping status: Never Used  Substance Use Topics   Alcohol  use: Yes  Alcohol /week: 4.0 standard drinks of alcohol     Types: 2 Glasses of wine, 2 Cans of beer per week    Comment: occasionally   Drug use: No    Family Medical History: Family History  Problem Relation Age of Onset   Breast cancer Neg Hx     Physical Examination: Vitals:   03/19/24 1324  BP: 120/60    General: Patient is in no apparent distress. Attention to examination is appropriate.  Neck:   Supple.  Full range of motion.  Respiratory: Patient is breathing without any difficulty.   NEUROLOGICAL:     Awake, alert, oriented to person, place, and time.  Speech is clear and fluent.   Cranial Nerves: Pupils equal round and reactive to light.  Facial tone is symmetric.  Facial sensation is symmetric. Shoulder shrug is symmetric. Tongue protrusion is midline.  There is no pronator drift.  Strength: Side Biceps Triceps Deltoid Interossei Grip Wrist Ext. Wrist Flex.  R 5 5 5 5 5 5 5   L 5 5 5 5 5 5 5    Side Iliopsoas Quads Hamstring PF DF EHL  R 5 5 5 5 5 5   L 5 5 5 5  5 5    Reflexes are 1+ and symmetric at the biceps, triceps, brachioradialis, patella and achilles.   Hoffman's is absent.   Bilateral upper and lower extremity sensation is intact to light touch.    No evidence of dysmetria noted.  Gait is abnormal and requires a cane.     Medical Decision Making  Imaging: MRI L spine 10/31/2023 IMPRESSION: 1. Compared with previous MRI from 08/28/2019, there is progressive multifactorial spinal stenosis at L2-3 and L4-5, now moderate at both levels. 2. There is asymmetric narrowing of the left lateral recess at L2-3 with probable left L3 nerve root impingement in the canal. Moderate lateral recess and foraminal narrowing bilaterally at L4-5. 3. Mild multifactorial spinal stenosis at L3-4 without nerve root impingement. 4. Stable chronic asymmetric narrowing of the right lateral recess at L5-S1. 5. No acute findings demonstrated.     Electronically Signed   By: Elsie Perone M.D.   On: 11/09/2023 14:10  I have personally reviewed the images and agree with the above interpretation.  Assessment and Plan: Mary. Mary Elliott is a pleasant 83 y.o. female with lumbar stenosis at L2-3 and L4-5 with spondylolisthesis at L4-5.  She has had spondylolisthesis at level for 20 years.  She is doing better than she was prior to her InterStim device placement.  Will see her back in 3 months.  If she is still stable, we will see her back as needed.   I spent a total of 10 minutes in this patient's care today. This time was spent reviewing pertinent records including imaging studies, obtaining and confirming history, performing a directed evaluation, formulating and discussing my recommendations, and documenting the visit within the medical record.     Thank you for involving me in the care of this patient.      Lonny Eisen K. Clois MD, [facility]

## 2024-03-27 DIAGNOSIS — D2339 Other benign neoplasm of skin of other parts of face: Secondary | ICD-10-CM | POA: Diagnosis not present

## 2024-03-27 DIAGNOSIS — C44619 Basal cell carcinoma of skin of left upper limb, including shoulder: Secondary | ICD-10-CM | POA: Diagnosis not present

## 2024-03-27 DIAGNOSIS — L821 Other seborrheic keratosis: Secondary | ICD-10-CM | POA: Diagnosis not present

## 2024-03-27 DIAGNOSIS — Z85828 Personal history of other malignant neoplasm of skin: Secondary | ICD-10-CM | POA: Diagnosis not present

## 2024-03-27 DIAGNOSIS — D2262 Melanocytic nevi of left upper limb, including shoulder: Secondary | ICD-10-CM | POA: Diagnosis not present

## 2024-03-27 DIAGNOSIS — D485 Neoplasm of uncertain behavior of skin: Secondary | ICD-10-CM | POA: Diagnosis not present

## 2024-03-27 DIAGNOSIS — D2261 Melanocytic nevi of right upper limb, including shoulder: Secondary | ICD-10-CM | POA: Diagnosis not present

## 2024-03-27 DIAGNOSIS — D225 Melanocytic nevi of trunk: Secondary | ICD-10-CM | POA: Diagnosis not present

## 2024-03-29 ENCOUNTER — Ambulatory Visit
Admission: RE | Admit: 2024-03-29 | Discharge: 2024-03-29 | Disposition: A | Discharge status: Home / Self Care | Source: Ambulatory Visit | Attending: Internal Medicine | Admitting: Internal Medicine

## 2024-03-29 DIAGNOSIS — N3281 Overactive bladder: Secondary | ICD-10-CM | POA: Diagnosis not present

## 2024-03-29 DIAGNOSIS — Z1231 Encounter for screening mammogram for malignant neoplasm of breast: Secondary | ICD-10-CM | POA: Diagnosis not present

## 2024-04-01 DIAGNOSIS — I10 Essential (primary) hypertension: Secondary | ICD-10-CM | POA: Diagnosis not present

## 2024-04-01 DIAGNOSIS — E034 Atrophy of thyroid (acquired): Secondary | ICD-10-CM | POA: Diagnosis not present

## 2024-04-01 DIAGNOSIS — Z131 Encounter for screening for diabetes mellitus: Secondary | ICD-10-CM | POA: Diagnosis not present

## 2024-04-01 DIAGNOSIS — K219 Gastro-esophageal reflux disease without esophagitis: Secondary | ICD-10-CM | POA: Diagnosis not present

## 2024-04-01 DIAGNOSIS — E7800 Pure hypercholesterolemia, unspecified: Secondary | ICD-10-CM | POA: Diagnosis not present

## 2024-04-08 DIAGNOSIS — G629 Polyneuropathy, unspecified: Secondary | ICD-10-CM | POA: Diagnosis not present

## 2024-04-08 DIAGNOSIS — R140 Abdominal distension (gaseous): Secondary | ICD-10-CM | POA: Diagnosis not present

## 2024-04-08 DIAGNOSIS — N3941 Urge incontinence: Secondary | ICD-10-CM | POA: Diagnosis not present

## 2024-04-08 DIAGNOSIS — M48062 Spinal stenosis, lumbar region with neurogenic claudication: Secondary | ICD-10-CM | POA: Diagnosis not present

## 2024-04-08 DIAGNOSIS — E7800 Pure hypercholesterolemia, unspecified: Secondary | ICD-10-CM | POA: Diagnosis not present

## 2024-04-08 DIAGNOSIS — Z1331 Encounter for screening for depression: Secondary | ICD-10-CM | POA: Diagnosis not present

## 2024-04-08 DIAGNOSIS — I10 Essential (primary) hypertension: Secondary | ICD-10-CM | POA: Diagnosis not present

## 2024-04-08 DIAGNOSIS — K219 Gastro-esophageal reflux disease without esophagitis: Secondary | ICD-10-CM | POA: Diagnosis not present

## 2024-04-08 DIAGNOSIS — F331 Major depressive disorder, recurrent, moderate: Secondary | ICD-10-CM | POA: Diagnosis not present

## 2024-04-08 DIAGNOSIS — Z0000 Encounter for general adult medical examination without abnormal findings: Secondary | ICD-10-CM | POA: Diagnosis not present

## 2024-04-08 DIAGNOSIS — E034 Atrophy of thyroid (acquired): Secondary | ICD-10-CM | POA: Diagnosis not present

## 2024-04-08 DIAGNOSIS — R002 Palpitations: Secondary | ICD-10-CM | POA: Diagnosis not present

## 2024-04-09 DIAGNOSIS — H1132 Conjunctival hemorrhage, left eye: Secondary | ICD-10-CM | POA: Diagnosis not present

## 2024-04-16 DIAGNOSIS — C44619 Basal cell carcinoma of skin of left upper limb, including shoulder: Secondary | ICD-10-CM | POA: Diagnosis not present

## 2024-05-20 ENCOUNTER — Encounter: Payer: Self-pay | Admitting: *Deleted

## 2024-06-05 ENCOUNTER — Other Ambulatory Visit: Payer: Self-pay | Admitting: Neurology

## 2024-06-05 DIAGNOSIS — G3184 Mild cognitive impairment, so stated: Secondary | ICD-10-CM

## 2024-06-05 DIAGNOSIS — I6789 Other cerebrovascular disease: Secondary | ICD-10-CM

## 2024-06-10 ENCOUNTER — Ambulatory Visit
Admission: RE | Admit: 2024-06-10 | Discharge: 2024-06-10 | Disposition: A | Source: Ambulatory Visit | Attending: Neurology | Admitting: Neurology

## 2024-06-10 DIAGNOSIS — I6789 Other cerebrovascular disease: Secondary | ICD-10-CM

## 2024-06-10 DIAGNOSIS — G3184 Mild cognitive impairment, so stated: Secondary | ICD-10-CM

## 2024-06-13 NOTE — Progress Notes (Unsigned)
 "  Referring Physician:  Fernande Ophelia JINNY DOUGLAS, MD [address] [facility] [place],  [place] [address]  Primary Physician:  Fernande Ophelia JINNY DOUGLAS, MD  History of Present Illness: Mary Elliott has a history of hypothyroidism, neuropathy, hyperlipidemia, gastric ulcers, diverticulitis, HTN.   She is a poor historian.   She has known moderate central stenosis L2-L5, with slip at L4-L5 along with moderate foraminal stenosis L4-L5 and right lateral recess stenosis L5-S1.   She last saw Dr. Clois on 03/19/24 and she was doing well regarding her back and legs. She is here for follow up.    If doing well today, can f/u prn per Clois***    She got up off the toilet in June and felt weakness in both legs- felt like legs were going to give way. This improved after a few seconds. This has happened multiple times and has started happening when she is walking.   She does not have any significant back pain. With any prolonged standing or walking, she has weakness and heaviness in both legs. Legs feel like noodles. This improves when she stops and sits down. She does better with a grocery cart.   No NSAIDs due to history of gastric ulcers. She felt like PT made her stronger.   Tobacco use: Does not smoke.   Bowel/Bladder Dysfunction: she notes bladder incontinence x years. She notes some occasional bowel incontinence x years- she does not always feel the urge to go. She's had bowel issues since hemorrhoidectomy at age 28.   Conservative measures:  Physical therapy:  has participated in physical therapy with [facility] 11/2022-05/2023 Multimodal medical therapy including regular antiinflammatories: none Injections: 12/31/2019: Left L5-S1 and left S1 transforaminal ESI 09/05/2012: Left sacroiliac joint injection   Past Surgery: no spine surgery  Mary Elliott has no symptoms of cervical myelopathy.  The symptoms are causing a significant impact on the patient's life.    Review of Systems:  A 10 point review of systems is negative, except for the pertinent positives and negatives detailed in the HPI.  Past Medical History: Past Medical History:  Diagnosis Date   Anemia    Anxiety    Arthritis    fingers   Cancer (HCC) 1999   basil cell on face   GERD (gastroesophageal reflux disease)    history of   History of kidney stones    twice   HOH (hard of hearing)    Hypertension    Hypothyroidism    PONV (postoperative nausea and vomiting)    nausea only with 2 prior surgeries   Tinnitus     Past Surgical History: Past Surgical History:  Procedure Laterality Date   ABDOMINAL HYSTERECTOMY     basil cell     face   BLEPHAROPLASTY     CATARACT EXTRACTION W/PHACO Left 08/26/2020   Procedure: CATARACT EXTRACTION PHACO AND INTRAOCULAR LENS PLACEMENT (IOC) LEFT PANOPTIX TORIC;  Surgeon: Mittie Gaskin, MD;  Location: [facility];  Service: Ophthalmology;  Laterality: Left;  8.88 1.:27.2 10.1%   CATARACT EXTRACTION W/PHACO Right 09/09/2020   Procedure: CATARACT EXTRACTION PHACO AND INTRAOCULAR LENS PLACEMENT (IOC) RIGHT PANOPTIX TORIC 8.54 01:03.3 13.5%;  Surgeon: Mittie Gaskin, MD;  Location: [facility];  Service: Ophthalmology;  Laterality: Right;   CYSTOCELE REPAIR  2005   HEMORROIDECTOMY     JOINT REPLACEMENT Bilateral 11/2016   KNEE ARTHROPLASTY Left 11/14/2016   Procedure: COMPUTER ASSISTED TOTAL KNEE ARTHROPLASTY;  Surgeon: Mardee Lynwood SQUIBB, MD;  Location: [facility];  Service: Orthopedics;  Laterality: Left;   KNEE ARTHROPLASTY Right 03/15/2017   Procedure: COMPUTER ASSISTED TOTAL KNEE ARTHROPLASTY;  Surgeon: Mardee Lynwood SQUIBB, MD;  Location: [facility];  Service: Orthopedics;  Laterality: Right;   KNEE ARTHROSCOPY Left    LIPOSUCTION     PELVIC FLOOR REPAIR     SHOULDER ARTHROSCOPY WITH DEBRIDEMENT AND BICEP TENDON REPAIR Left 09/24/2019   Procedure: Left shoulder arthroscopy with debridement, decompression, rotator cuff  repair, and possible biceps tenodesis.;  Surgeon: Edie Norleen PARAS, MD;  Location: [facility];  Service: Orthopedics;  Laterality: Left;   TOE SURGERY Right    TONSILLECTOMY     VARICOSE VEIN SURGERY      Allergies: Allergies as of 06/19/2024 - Review Complete 02/02/2024  Allergen Reaction Noted   Clindamycin Itching 07/24/2023   Clindamycin/lincomycin Hives 01/11/2017   Nitrofurantoin Hives 01/03/2013   Ciprofloxacin  Other (See Comments) 04/11/2023   Nickel Itching 07/24/2023   Amlodipine Other (See Comments) 07/03/2017   Amoxicillin  Itching 07/09/2018   Duloxetine Other (See Comments) 01/03/2013   Nsaids Other (See Comments) 01/03/2013   Oxybutynin Hives, Rash, and Other (See Comments) 01/03/2013   Sulfa antibiotics Other (See Comments) and Diarrhea 01/03/2013   Tegaserod Other (See Comments) 07/23/2013   Ultram  [tramadol ] Itching 03/02/2017   Vesicare [solifenacin succinate] Other (See Comments) 11/02/2016    Medications: Outpatient Encounter Medications as of 06/19/2024  Medication Sig   busPIRone (BUSPAR) 5 MG tablet Take 5 mg by mouth 2 (two) times daily.   Cholecalciferol  (VITAMIN D3) 2000 units capsule Take 2,000 Units by mouth daily.   estradiol  (ESTRACE ) 0.1 MG/GM vaginal cream Place 1 Applicatorful vaginally 2 (two) times a week.    levothyroxine  (SYNTHROID , LEVOTHROID) 100 MCG tablet Take 100 mcg by mouth daily before breakfast.   loratadine  (CLARITIN ) 10 MG tablet Take 10 mg by mouth daily as needed for allergies.   meclizine  (ANTIVERT ) 25 MG tablet Take 1 tablet (25 mg total) by mouth 3 (three) times daily as needed for dizziness.   metoprolol  succinate (TOPROL -XL) 100 MG 24 hr tablet Take 100 mg by mouth daily.    metroNIDAZOLE  (METROCREAM ) 0.75 % cream Apply 1 application topically 2 (two) times daily as needed (rosacea.).    mirabegron  ER (MYRBETRIQ ) 25 MG TB24 tablet Take 1 tablet (25 mg total) by mouth daily.   Probiotic Product (PROBIOTIC PO) Take 1 capsule by mouth  daily after lunch.   sertraline  (ZOLOFT ) 100 MG tablet Take 100 mg by mouth daily.   vitamin B-12 (CYANOCOBALAMIN ) 1000 MCG tablet Take 1,000 mcg by mouth daily.   No facility-administered encounter medications on file as of 06/19/2024.    Social History: Social History   Tobacco Use   Smoking status: Former    Current packs/day: 0.00    Types: Cigarettes    Quit date: 03/02/1968    Years since quitting: 56.3   Smokeless tobacco: Never  Vaping Use   Vaping status: Never Used  Substance Use Topics   Alcohol  use: Yes    Alcohol /week: 4.0 standard drinks of alcohol     Types: 2 Glasses of wine, 2 Cans of beer per week    Comment: occasionally   Drug use: No    Family Medical History: Family History  Problem Relation Age of Onset   Breast cancer Neg Hx     Physical Examination: There were no vitals filed for this visit.    Awake, alert, oriented to person, place, and time.  Speech is clear  and fluent. Fund of knowledge is appropriate.   Cranial Nerves: Pupils equal round and reactive to light.  Facial tone is symmetric.    No posterior lumbar tenderness.   No abnormal lesions on exposed skin.   Strength: Side Biceps Triceps Deltoid Interossei Grip Wrist Ext. Wrist Flex.  R 5 5 5 5 5 5 5   L 5 5 5 5 5 5 5    Side Iliopsoas Quads Hamstring PF DF EHL  R 5 5 5 5 5 5   L 5 5 5 5 5 5    Reflexes are 2+ and symmetric at the biceps, brachioradialis, patella and achilles.   Hoffman's is absent.  Clonus is not present.   Bilateral upper and lower extremity sensation is intact to light touch.     No pain with IR/ER of both hips.   Gait is slow. She ambulates with a cane.   Medical Decision Making  Imaging: None  Assessment and Plan: Mary Elliott does not have any significant back pain. With any prolonged standing or walking, she has weakness and heaviness in both legs. Legs feel like noodles. This improves when she stops and sits down. She does better with a grocery cart.    She has known moderate central stenosis L2-L5, with slip at L4-L5 along with moderate foraminal stenosis L4-L5 and right lateral recess stenosis L5-S1.   Symptoms are consistent with spinal stenosis.   Treatment options discussed with patient and following plan made:   - She has failed conservative treatment including PT, medications, and time.  - Recommend she follow up with Dr. Clois to discuss any possible surgical options. She may want to revisit injections, but she wants to understand her options.  - Will get lumbar flex/ext xrays on her way out today. Will message her with results.   I spent a total of *** minutes in face-to-face and non-face-to-face activities related to this patient's care today including review of outside records, review of imaging, review of symptoms, physical exam, discussion of differential diagnosis, discussion of treatment options, and documentation.   Glade Boys PA-C Dept. of Neurosurgery  "

## 2024-06-19 ENCOUNTER — Ambulatory Visit: Admitting: Orthopedic Surgery
